# Patient Record
Sex: Male | Born: 1955 | Race: White | Hispanic: No | Marital: Married | Smoking: Former smoker
Health system: Southern US, Community
[De-identification: ages and names within clinical notes are randomized; demographics above are authoritative.]

## PROBLEM LIST (undated history)

## (undated) DIAGNOSIS — R002 Palpitations: Secondary | ICD-10-CM

## (undated) DIAGNOSIS — E785 Hyperlipidemia, unspecified: Secondary | ICD-10-CM

## (undated) DIAGNOSIS — K760 Fatty (change of) liver, not elsewhere classified: Secondary | ICD-10-CM

## (undated) DIAGNOSIS — K219 Gastro-esophageal reflux disease without esophagitis: Secondary | ICD-10-CM

## (undated) DIAGNOSIS — F419 Anxiety disorder, unspecified: Secondary | ICD-10-CM

## (undated) DIAGNOSIS — E559 Vitamin D deficiency, unspecified: Secondary | ICD-10-CM

## (undated) DIAGNOSIS — K589 Irritable bowel syndrome without diarrhea: Secondary | ICD-10-CM

## (undated) DIAGNOSIS — E119 Type 2 diabetes mellitus without complications: Secondary | ICD-10-CM

## (undated) DIAGNOSIS — M1990 Unspecified osteoarthritis, unspecified site: Secondary | ICD-10-CM

## (undated) DIAGNOSIS — F411 Generalized anxiety disorder: Secondary | ICD-10-CM

## (undated) DIAGNOSIS — R7989 Other specified abnormal findings of blood chemistry: Secondary | ICD-10-CM

## (undated) DIAGNOSIS — I10 Essential (primary) hypertension: Secondary | ICD-10-CM

## (undated) HISTORY — DX: Hyperlipidemia, unspecified: E78.5

## (undated) HISTORY — DX: Other specified abnormal findings of blood chemistry: R79.89

## (undated) HISTORY — DX: Gastro-esophageal reflux disease without esophagitis: K21.9

## (undated) HISTORY — PX: COLONOSCOPY: SHX174

## (undated) HISTORY — PX: POLYPECTOMY: SHX149

## (undated) HISTORY — DX: Anxiety disorder, unspecified: F41.9

## (undated) HISTORY — DX: Essential (primary) hypertension: I10

## (undated) HISTORY — PX: OTHER SURGICAL HISTORY: SHX169

## (undated) HISTORY — DX: Fatty (change of) liver, not elsewhere classified: K76.0

## (undated) HISTORY — DX: Vitamin D deficiency, unspecified: E55.9

## (undated) HISTORY — PX: CARPAL TUNNEL RELEASE: SHX101

## (undated) HISTORY — DX: Generalized anxiety disorder: F41.1

## (undated) HISTORY — DX: Palpitations: R00.2

## (undated) HISTORY — DX: Unspecified osteoarthritis, unspecified site: M19.90

## (undated) HISTORY — DX: Irritable bowel syndrome, unspecified: K58.9

---

## 2001-12-29 ENCOUNTER — Ambulatory Visit (HOSPITAL_COMMUNITY): Admission: RE | Admit: 2001-12-29 | Discharge: 2001-12-29 | Payer: Self-pay | Admitting: Family Medicine

## 2001-12-29 ENCOUNTER — Encounter: Payer: Self-pay | Admitting: Family Medicine

## 2002-04-11 ENCOUNTER — Encounter: Payer: Self-pay | Admitting: Gastroenterology

## 2002-04-11 ENCOUNTER — Ambulatory Visit (HOSPITAL_COMMUNITY): Admission: RE | Admit: 2002-04-11 | Discharge: 2002-04-11 | Payer: Self-pay | Admitting: Gastroenterology

## 2006-07-01 ENCOUNTER — Ambulatory Visit: Payer: Self-pay | Admitting: Gastroenterology

## 2006-07-15 ENCOUNTER — Ambulatory Visit: Payer: Self-pay | Admitting: Gastroenterology

## 2010-01-20 ENCOUNTER — Telehealth: Payer: Self-pay | Admitting: Gastroenterology

## 2010-04-17 NOTE — Progress Notes (Signed)
Summary: Abd Pain   Phone Note Call from Patient Call back at Home Phone [phone]   Caller: spouse-Karen Call For: Dr Russella Dar Summary of Call: Abd Pain  Initial call taken by: Leanor Kail [facility],  January 20, 2010 10:18 AM  Follow-up for Phone Call        I spoke with the patient's wife, he is not having abdominal pain, he he is having rectal discomfort.  Wife describes a "polyp" in the rectum.  He told her it was painful.  They wanted to know who to see.  Patient is advised we can schedule an appointment here or he can start with his primary care.  Wife states they will call back once she speaks with her husband again. Follow-up by: Darcey Nora RN, CGRN,  January 20, 2010 11:03 AM

## 2011-06-01 ENCOUNTER — Encounter: Payer: Self-pay | Admitting: Gastroenterology

## 2011-06-15 ENCOUNTER — Encounter: Payer: Self-pay | Admitting: Gastroenterology

## 2011-07-17 ENCOUNTER — Ambulatory Visit (AMBULATORY_SURGERY_CENTER): Payer: BC Managed Care – PPO | Admitting: *Deleted

## 2011-07-17 VITALS — Ht 63.0 in | Wt 182.0 lb

## 2011-07-17 DIAGNOSIS — Z800 Family history of malignant neoplasm of digestive organs: Secondary | ICD-10-CM

## 2011-07-17 DIAGNOSIS — Z86010 Personal history of colonic polyps: Secondary | ICD-10-CM

## 2011-07-17 MED ORDER — PEG-KCL-NACL-NASULF-NA ASC-C 100 G PO SOLR: ORAL | Status: DC

## 2011-07-17 NOTE — Progress Notes (Signed)
Patient states he had no problems with sedation last 2 colonoscopies, he did not want to change colonoscopy date and did not want deep sedation.

## 2011-07-20 ENCOUNTER — Encounter: Payer: Self-pay | Admitting: General Practice

## 2011-07-20 ENCOUNTER — Encounter: Payer: Self-pay | Admitting: Gastroenterology

## 2011-07-31 ENCOUNTER — Encounter: Payer: Self-pay | Admitting: Gastroenterology

## 2011-07-31 ENCOUNTER — Ambulatory Visit (AMBULATORY_SURGERY_CENTER): Payer: BC Managed Care – PPO | Admitting: Gastroenterology

## 2011-07-31 VITALS — BP 148/95 | HR 83 | Temp 96.5°F | Resp 20 | Ht 63.0 in | Wt 182.0 lb

## 2011-07-31 DIAGNOSIS — Z800 Family history of malignant neoplasm of digestive organs: Secondary | ICD-10-CM

## 2011-07-31 DIAGNOSIS — D126 Benign neoplasm of colon, unspecified: Secondary | ICD-10-CM

## 2011-07-31 DIAGNOSIS — Z86010 Personal history of colonic polyps: Secondary | ICD-10-CM

## 2011-07-31 LAB — HM COLONOSCOPY

## 2011-07-31 MED ORDER — SODIUM CHLORIDE 0.9 % IV SOLN: 500.0000 mL | INTRAVENOUS | Status: DC

## 2011-07-31 NOTE — Progress Notes (Signed)
Patient did not experience any of the following events: a burn prior to discharge; a fall within the facility; wrong site/side/patient/procedure/implant event; or a hospital transfer or hospital admission upon discharge from the facility. (G8907) Patient did not have preoperative order for IV antibiotic SSI prophylaxis. (G8918)  

## 2011-07-31 NOTE — Op Note (Signed)
[facility] [address] [place], [place]  [address]  COLONOSCOPY PROCEDURE REPORT PATIENT:  Gerhart, Ruggieri  MR#:  [id number] BIRTHDATE:  04/13/1955, 55 yrs. old  GENDER:  male ENDOSCOPIST:  Judie Petit T. Russella Dar, MD, [facility]  PROCEDURE DATE:  07/31/2011 PROCEDURE:  Colonoscopy with snare polypectomy ASA CLASS:  Class II INDICATIONS:  1) surveillance and high-risk screening  2) history of pre-cancerous (adenomatous) colon polyps : 2004 MEDICATIONS:   These medications were titrated to patient response per physician's verbal order, Fentanyl 75 mcg IV, Versed 8 mg IV DESCRIPTION OF PROCEDURE:   After the risks benefits and alternatives of the procedure were thoroughly explained, informed consent was obtained.  Digital rectal exam was performed and revealed no abnormalities.   The LB CF-[id number] endoscope was introduced through the anus and advanced to the cecum, which was identified by both the appendix and ileocecal valve, without limitations.  The quality of the prep was excellent, using MoviPrep.  The instrument was then slowly withdrawn as the colon was fully examined. <<PROCEDUREIMAGES>> FINDINGS:  A sessile polyp was found in the mid transverse colon. It was 5 mm in size. Polyp was snared without cautery. Retrieval was successful. A sessile polyp was found at the splenic flexure. It was 5 mm in size. Polyp was snared without cautery. Retrieval was unsuccessful. Mild diverticulosis was found in the sigmoid colon. Otherwise normal colonoscopy without other polyps, masses, vascular ectasias, or inflammatory changes.   Retroflexed views in the rectum revealed no abnormalities.  The time to cecum =  1.5 minutes. The scope was then withdrawn (time =  10  min) from the patient and the procedure completed.  COMPLICATIONS:  None  ENDOSCOPIC IMPRESSION: 1) 5 mm sessile polyp in the mid transverse colon 2) 5 mm sessile polyp at the splenic flexure 3) Mild diverticulosis in  the sigmoid colon  RECOMMENDATIONS: 1) Await pathology results 2) High fiber diet with liberal fluid intake. 3) Repeat Colonoscopy in 5 years.  Venita Lick. Russella Dar, MD, [facility]  CC:  Rudi Heap, MD  n. Rosalie DoctorVenita Lick. Cale Bethard at 07/31/2011 10:51 AM  Serafina Royals, [phone]

## 2011-07-31 NOTE — Patient Instructions (Addendum)

## 2011-08-03 ENCOUNTER — Telehealth: Payer: Self-pay | Admitting: *Deleted

## 2011-08-03 NOTE — Telephone Encounter (Signed)
Left message

## 2011-08-04 ENCOUNTER — Encounter: Payer: Self-pay | Admitting: Gastroenterology

## 2011-08-18 ENCOUNTER — Other Ambulatory Visit: Payer: Self-pay | Admitting: Family Medicine

## 2011-08-18 DIAGNOSIS — R1031 Right lower quadrant pain: Secondary | ICD-10-CM

## 2011-08-21 ENCOUNTER — Ambulatory Visit (HOSPITAL_COMMUNITY)
Admission: RE | Admit: 2011-08-21 | Discharge: 2011-08-21 | Disposition: A | Discharge status: Home / Self Care | Payer: BC Managed Care – PPO | Source: Ambulatory Visit | Attending: Family Medicine | Admitting: Family Medicine

## 2011-08-21 DIAGNOSIS — K824 Cholesterolosis of gallbladder: Secondary | ICD-10-CM | POA: Insufficient documentation

## 2011-08-21 DIAGNOSIS — R1031 Right lower quadrant pain: Secondary | ICD-10-CM

## 2011-08-21 DIAGNOSIS — K7689 Other specified diseases of liver: Secondary | ICD-10-CM | POA: Insufficient documentation

## 2011-12-22 ENCOUNTER — Encounter: Payer: Self-pay | Admitting: Cardiology

## 2011-12-22 ENCOUNTER — Ambulatory Visit (INDEPENDENT_AMBULATORY_CARE_PROVIDER_SITE_OTHER): Payer: BC Managed Care – PPO | Admitting: Cardiology

## 2011-12-22 VITALS — BP 168/105 | HR 97 | Ht 63.0 in | Wt 176.0 lb

## 2011-12-22 DIAGNOSIS — R002 Palpitations: Secondary | ICD-10-CM | POA: Insufficient documentation

## 2011-12-22 DIAGNOSIS — F419 Anxiety disorder, unspecified: Secondary | ICD-10-CM

## 2011-12-22 DIAGNOSIS — E785 Hyperlipidemia, unspecified: Secondary | ICD-10-CM

## 2011-12-22 DIAGNOSIS — F411 Generalized anxiety disorder: Secondary | ICD-10-CM

## 2011-12-22 DIAGNOSIS — I10 Essential (primary) hypertension: Secondary | ICD-10-CM

## 2011-12-22 NOTE — Progress Notes (Signed)
HPI The patient presents as a new patient for evaluation of palpitations. He apparently was seen some years ago with a hypotensive episode after being treated with medications for hypertension. He reports having a stress test which was normal. She's had no other cardiac workup. He said in June of this year he started noticing increasing palpitations. He would describe skipped beats. These would be frequent. He did wear a Holter and he was told there was sinus arrhythmia. He did stop drinking caffeine at bedtime and stopped eating pork. He said his symptoms improved though he still occasionally will get palpitations.  He reports that he does get some arm discomfort. This is sporadic and at rest and may have been with emotional anxiety. He describes himself as high strung.  His wife agrees. He has not get chest pressure, neck or arm discomfort. He may get short of breath when he exerts himself such as at work where he has a very physical job. He's not describing PND or orthopnea. He's not describing presyncope or syncope.  No Known Allergies  Current Outpatient Prescriptions  Medication Sig Dispense Refill  . ALPRAZolam (XANAX) 0.25 MG tablet Take 0.25 mg by mouth 2 (two) times daily as needed.      Marland Kitchen aspirin 81 MG tablet Take 81 mg by mouth daily.      . hydrochlorothiazide (MICROZIDE) 12.5 MG capsule Take 12.5 mg by mouth daily.      . pravastatin (PRAVACHOL) 20 MG tablet Take 20 mg by mouth daily.        Past Medical History  Diagnosis Date  . IBS (irritable bowel syndrome)   . GAD (generalized anxiety disorder)   . Palpitations   . Hypertension     No past surgical history on file.  Family History  Problem Relation Age of Onset  . Colon cancer Neg Hx   . Rheum arthritis Mother   . Heart disease Father     History   Social History  . Marital Status: Single    Spouse Name: N/A    Number of Children: N/A  . Years of Education: N/A   Occupational History  . Not on file.    Social History Main Topics  . Smoking status: Former Smoker    Quit date: 12/22/1991  . Smokeless tobacco: Never Used  . Alcohol Use: 1.2 oz/week    2 Cans of beer per week  . Drug Use: No  . Sexually Active: Not on file   Other Topics Concern  . Not on file   Social History Narrative  . No narrative on file    ROS:  Otherwise as stated in the HPI and negative for all other systems.  PHYSICAL EXAM BP 168/105  Pulse 97  Ht 5\' 3"  (1.6 m)  Wt 79.833 kg (176 lb)  BMI 31.18 kg/m2 GENERAL:  Well appearing HEENT:  Pupils equal round and reactive, fundi not visualized, oral mucosa unremarkable NECK:  No jugular venous distention, waveform within normal limits, carotid upstroke brisk and symmetric, no bruits, no thyromegaly LYMPHATICS:  No cervical, inguinal adenopathy LUNGS:  Clear to auscultation bilaterally BACK:  No CVA tenderness CHEST:  Unremarkable HEART:  PMI not displaced or sustained,S1 and S2 within normal limits, no S3, no S4, no clicks, no rubs, no murmurs ABD:  Flat, positive bowel sounds normal in frequency in pitch, no bruits, no rebound, no guarding, no midline pulsatile mass, no hepatomegaly, no splenomegaly EXT:  2 plus pulses throughout, no edema, no cyanosis  no clubbing SKIN:  No rashes no nodules NEURO:  Cranial nerves II through XII grossly intact, motor grossly intact throughout PSYCH:  Cognitively intact, oriented to person place and time  EKG:  Sinus rhythm, rate 97, axis within rightward, nonspecific inferior T wave changes, borderline QT prolongation.  12/22/2011   ASSESSMENT AND PLAN  Palpitations - I will make sure he has a structurally normal heart by checking an echocardiogram.  Likely I will manage this conservatively as he is sensitive to medications. Further evaluation will be based on symptoms and the results of an echo and treadmill test.  Arm pain - This is somewhat atypical.  I will bring the patient back for a POET (Plain Old Exercise  Test). This will allow me to screen for obstructive coronary disease, risk stratify and very importantly provide a prescription for exercise.  Anxiety - We discussed this for quite a while and I encouraged him to get followup for anxiety and possible obsessive of compulsive component.  Dyslipidemia - His LDL recently was 151. He was only placed on pravastatin because he has been so sensitive to medications. I discussed with him that he will need diet control as well.  Hypertension - I reviewed her blood pressure diary. His blood pressure is controlled at home. I have suggested exercise and weight loss to complement the hydrochlorothiazide.

## 2011-12-22 NOTE — Patient Instructions (Addendum)
The current medical regimen is effective;  continue present plan and medications.  Your physician has requested that you have an echocardiogram. Echocardiography is a painless test that uses sound waves to create images of your heart. It provides your doctor with information about the size and shape of your heart and how well your heart's chambers and valves are working. This procedure takes approximately one hour. There are no restrictions for this procedure.  Your physician has requested that you have an exercise tolerance test. For further information please visit [email or web address]. Please also follow instruction sheet, as given.   

## 2011-12-31 ENCOUNTER — Ambulatory Visit (HOSPITAL_COMMUNITY): Payer: BC Managed Care – PPO | Attending: Cardiovascular Disease | Admitting: Radiology

## 2011-12-31 DIAGNOSIS — R002 Palpitations: Secondary | ICD-10-CM | POA: Insufficient documentation

## 2011-12-31 DIAGNOSIS — I369 Nonrheumatic tricuspid valve disorder, unspecified: Secondary | ICD-10-CM | POA: Insufficient documentation

## 2011-12-31 DIAGNOSIS — I059 Rheumatic mitral valve disease, unspecified: Secondary | ICD-10-CM | POA: Insufficient documentation

## 2011-12-31 DIAGNOSIS — I10 Essential (primary) hypertension: Secondary | ICD-10-CM | POA: Insufficient documentation

## 2011-12-31 NOTE — Progress Notes (Signed)
Echocardiogram performed.  

## 2012-01-01 ENCOUNTER — Ambulatory Visit (INDEPENDENT_AMBULATORY_CARE_PROVIDER_SITE_OTHER): Payer: BC Managed Care – PPO | Admitting: Physician Assistant

## 2012-01-01 DIAGNOSIS — R002 Palpitations: Secondary | ICD-10-CM

## 2012-01-01 NOTE — Procedures (Signed)
4Exercise Treadmill Test  Pre-Exercise Testing Evaluation Rhythm: normal sinus  Rate: 92   PR:  .15 QRS:  .08  QT:  .35 QTc: .44     Test  Exercise Tolerance Test Ordering MD: Angelina Sheriff, MD  Interpreting MD: Tereso Newcomer , PA-C  Unique Test No: 1  Treadmill:  1  Indication for ETT: Palp  Contraindication to ETT: No   Stress Modality: exercise - treadmill  Cardiac Imaging Performed: non   Protocol: standard Bruce - maximal  Max BP:  217/89  Max MPHR (bpm):  166 85% MPR (bpm):  141  MPHR obtained (bpm):  171 % MPHR obtained:  103%  Reached 85% MPHR (min:sec):  4:00 Total Exercise Time (min-sec):  7:00  Workload in METS:  8.3 Borg Scale: 13  Reason ETT Terminated:  desired heart rate attained    ST Segment Analysis At Rest: normal ST segments - no evidence of significant ST depression With Exercise: no evidence of significant ST depression  Other Information Arrhythmia:  No Angina during ETT:  absent (0) Quality of ETT:  diagnostic  ETT Interpretation:  normal - no evidence of ischemia by ST analysis  Comments: Fair exercise tolerance. No chest pain. Normal BP response to exercise. No ST-T changes to suggest ischemia.  High resting heart rate noted.  Recommendations: Results d/w Drexel Iha. Follow up with Dr. Rollene Rotunda as directed. Signed,  Tereso Newcomer, PA-C  10:38 AM 01/01/2012

## 2012-01-06 ENCOUNTER — Encounter: Payer: Self-pay | Admitting: Cardiology

## 2012-05-23 ENCOUNTER — Encounter: Payer: Self-pay | Admitting: Family Medicine

## 2012-05-31 ENCOUNTER — Ambulatory Visit (INDEPENDENT_AMBULATORY_CARE_PROVIDER_SITE_OTHER): Payer: BC Managed Care – PPO | Admitting: Family Medicine

## 2012-05-31 ENCOUNTER — Encounter: Payer: Self-pay | Admitting: Family Medicine

## 2012-05-31 VITALS — BP 153/91 | HR 85 | Temp 98.7°F | Ht 63.0 in | Wt 182.8 lb

## 2012-05-31 DIAGNOSIS — E785 Hyperlipidemia, unspecified: Secondary | ICD-10-CM

## 2012-05-31 DIAGNOSIS — R0789 Other chest pain: Secondary | ICD-10-CM

## 2012-05-31 DIAGNOSIS — N529 Male erectile dysfunction, unspecified: Secondary | ICD-10-CM

## 2012-05-31 DIAGNOSIS — F411 Generalized anxiety disorder: Secondary | ICD-10-CM

## 2012-05-31 DIAGNOSIS — I10 Essential (primary) hypertension: Secondary | ICD-10-CM

## 2012-05-31 DIAGNOSIS — R002 Palpitations: Secondary | ICD-10-CM

## 2012-05-31 LAB — TSH: TSH: 2.164 u[IU]/mL (ref 0.350–4.500)

## 2012-05-31 MED ORDER — HYDROCHLOROTHIAZIDE 25 MG PO TABS: 25.0000 mg | ORAL_TABLET | Freq: Every day | ORAL | Status: DC

## 2012-05-31 MED ORDER — PAROXETINE HCL 10 MG PO TABS: 10.0000 mg | ORAL_TABLET | Freq: Every day | ORAL | Status: DC

## 2012-05-31 MED ORDER — HYDROCHLOROTHIAZIDE 12.5 MG PO CAPS: 12.5000 mg | ORAL_CAPSULE | Freq: Every day | ORAL | Status: DC

## 2012-05-31 MED ORDER — ALPRAZOLAM 0.25 MG PO TABS: 0.2500 mg | ORAL_TABLET | Freq: Two times a day (BID) | ORAL | Status: DC | PRN

## 2012-05-31 NOTE — Addendum Note (Signed)
Addended by: Ileana Ladd on: 05/31/2012 11:21 AM   Modules accepted: Orders

## 2012-05-31 NOTE — Progress Notes (Addendum)
Subjective:     Patient ID: Tyler Iha., male   DOB: Nov 23, 1955, 57 y.o.   MRN: [id number]  HPI Patient here to establish with Dr. Modesto Charon. Problem list include: Hypertension no headache or chest pain he does exhibit episodes of palpitations associated with anxiety he has had extensive workup with cardiology before, including stress test report in chart . Hypercholesterolemia he is on pravastatin without any problems no muscle aches. He's on Xanax which she uses at night to sleep and any time he has had anxiety and palpitations and feeling very tense. He now complains of a new problem of having erectile dysfunction. He used to be sexually active once every 2 weeks or 3 times a week and most. He cannot maintain his erection and loses it within a couple of seconds or even a minute he believes he needs to be on Viagra. Past Medical History  Diagnosis Date  . IBS (irritable bowel syndrome)   . GAD (generalized anxiety disorder)   . Palpitations   . Hypertension   . Palpitations   . Anxiety     generalized anxiety disorder  . Hyperlipidemia    Past Surgical History  Procedure Laterality Date  . None     History   Social History  . Marital Status: Single    Spouse Name: N/A    Number of Children: N/A  . Years of Education: N/A   Occupational History  . [employer]    Social History Main Topics  . Smoking status: Former Smoker    Types: Cigarettes    Quit date: 12/22/1991  . Smokeless tobacco: Never Used     Comment: Smoked few cigarettes in the past  . Alcohol Use: 1.2 oz/week    2 Cans of beer per week  . Drug Use: No  . Sexually Active: Not on file   Other Topics Concern  . Not on file   Social History Narrative   Lives with wife.  One child.  One grandchild.     Family History  Problem Relation Age of Onset  . Colon cancer Neg Hx   . Rheum arthritis Mother     Died age 75  . Heart disease Father 47    Valve replacement, died age 110 suicide   Current  Outpatient Prescriptions on File Prior to Visit  Medication Sig Dispense Refill  . ALPRAZolam (XANAX) 0.25 MG tablet Take 0.25 mg by mouth 2 (two) times daily as needed.      Marland Kitchen aspirin 81 MG tablet Take 81 mg by mouth daily.      . hydrochlorothiazide (MICROZIDE) 12.5 MG capsule Take 12.5 mg by mouth daily.      . pravastatin (PRAVACHOL) 20 MG tablet Take 20 mg by mouth daily.       No current facility-administered medications on file prior to visit.   No Known Allergies  There is no immunization history on file for this patient. Prior to Admission medications   Medication Sig Start Date End Date Taking? Authorizing Provider  ALPRAZolam (XANAX) 0.25 MG tablet Take 0.25 mg by mouth 2 (two) times daily as needed.   Yes Historical Provider, MD  aspirin 81 MG tablet Take 81 mg by mouth daily.   Yes Historical Provider, MD  hydrochlorothiazide (MICROZIDE) 12.5 MG capsule Take 12.5 mg by mouth daily.   Yes Historical Provider, MD  pravastatin (PRAVACHOL) 20 MG tablet Take 20 mg by mouth daily.   Yes Historical Provider, MD     Review  of Systems  Constitutional: Negative.   HENT: Negative.   Eyes: Negative.   Respiratory: Negative.   Cardiovascular: Positive for palpitations.  Gastrointestinal: Negative.   Endocrine: Negative.   Musculoskeletal: Negative.   Skin: Negative.   Allergic/Immunologic: Negative.   Neurological: Negative.   Hematological: Negative.   Psychiatric/Behavioral: Negative.    GU system he has erectile dysfunction as discussed in the history of present illness.:    Objective:   Physical Exam Appearance: The patient appeared well nourished and normally developed. In no acute distress. Central obesity   Vital signs:  As documented. BP 153/91  Pulse 85  Temp(Src) 98.7 F (37.1 C) (Oral)  Ht 5\' 3"  (1.6 m)  Wt 182 lb 12.8 oz (82.918 kg)  BMI 32.39 kg/m2    Head exam:  Unremarkable. No scleral icterus or corneal arcus noted.   Neck: Without jugular  venous distension, thyromegaly, or carotid bruits. Carotid upstrokes are brisk bilaterally.  Chest & Lungs: The chest wall is normal. The lungs are clear to auscultation and percussion.  Cardiac exam: Reveals the PMI to be normally sized and situated. Rhythm is regular. First and second heart sounds normal. No murmurs, rubs or gallops.  Abdominal exam: Reveals normal bowel sounds, No masses, no organomegaly and no aortic enlargement.  No guarding no rebound. No Bruits.  GU:  Extremities: nonedematous. Both femoral and pedal pulses are normal. Other peripheral pulses are normal. Joints: intact  Neurologic: Oriented to time place and person.  Strength is normal. Sensory is normal. Reflexes are normal. Cranial Nerves are normal.  Sensory exam: monofilament exam was normal and intact.  Skin exam:  Normal without Tattoos, rashes or scars.      Assessment:    1 Gen. anxiety disorder with palpitation. 2 history of irritable bowel syndrome. 3 hypertension. He has white coat syndrome his blood pressure tends to run normal at home when he measures it and also at work. 4 hypercholesterolemia. He is here fasting for lab work. 5 new complaint of erectile dysfunction.  On review of his chart he is noted to have an extensive cardiac workup. Unable to stop the patient is due to any abnormalities his EKG has no acute findings today.     Plan:     Orders Placed This Encounter  Procedures  . Basic Metabolic Panel  . Hepatic Function Panel  . NMR Lipoprofile with Lipids  . Testosterone  . TSH  . Prolactin  . EKG 12-Lead   EKG was reviewed today and there were no acute findings. With the extensive cardiac workup he had last year I doubt that his symptomatology is related with a cardiac event. His underlying problem of generalized anxiety disorder is his prevailing problem.  will go ahead and treat him with Paxil started at 10 mg daily with the hope of increasing the dose on  his next visit to 20 mg daily.  we'll get required labs needed. During his visit he discussed about having erectile dysfunction is a new problem and this was added to his problem list and and workup was initiated with lab work in Coumadin testosterone TSH and prolactin. Pending this result whether he would need to go on Viagra.  Patient agrees with this treatment plan. Await his lab results. Further intervention pending his lab results Rayland Hamed P. Modesto Charon, M.D.

## 2012-05-31 NOTE — Addendum Note (Signed)
Addended by: Ileana Ladd on: 05/31/2012 02:33 PM   Modules accepted: Orders, Medications

## 2012-06-01 LAB — HEPATIC FUNCTION PANEL
ALT: 61 U/L — ABNORMAL HIGH (ref 0–53)
AST: 36 U/L (ref 0–37)
Albumin: 5.1 g/dL (ref 3.5–5.2)
Alkaline Phosphatase: 57 U/L (ref 39–117)
Bilirubin, Direct: 0.1 mg/dL (ref 0.0–0.3)
Indirect Bilirubin: 0.4 mg/dL (ref 0.0–0.9)
Total Bilirubin: 0.5 mg/dL (ref 0.3–1.2)
Total Protein: 7.8 g/dL (ref 6.0–8.3)

## 2012-06-01 LAB — BASIC METABOLIC PANEL
BUN: 19 mg/dL (ref 6–23)
CO2: 27 mEq/L (ref 19–32)
Calcium: 10.1 mg/dL (ref 8.4–10.5)
Chloride: 101 mEq/L (ref 96–112)
Creat: 0.93 mg/dL (ref 0.50–1.35)
Glucose, Bld: 105 mg/dL — ABNORMAL HIGH (ref 70–99)
Potassium: 4.3 mEq/L (ref 3.5–5.3)
Sodium: 137 mEq/L (ref 135–145)

## 2012-06-01 LAB — PROLACTIN: Prolactin: 5.5 ng/mL (ref 2.1–17.1)

## 2012-06-01 LAB — TESTOSTERONE: Testosterone: 212 ng/dL — ABNORMAL LOW (ref 300–890)

## 2012-06-03 LAB — NMR LIPOPROFILE WITH LIPIDS
Cholesterol, Total: 180 mg/dL (ref <200)
HDL Particle Number: 41.5 umol/L (ref >=30.5)
HDL Size: 8.6 nm — ABNORMAL LOW (ref >=9.2)
HDL-C: 58 mg/dL (ref >=40)
LDL (calc): 98 mg/dL (ref <100)
LDL Particle Number: 1870 nmol/L — ABNORMAL HIGH (ref <1000)
LDL Size: 21 nm (ref >20.5)
LP-IR Score: 80 — ABNORMAL HIGH (ref <=45)
Large HDL-P: 3.3 umol/L — ABNORMAL LOW (ref >=4.8)
Large VLDL-P: 4.5 nmol/L — ABNORMAL HIGH (ref <=2.7)
Small LDL Particle Number: 980 nmol/L — ABNORMAL HIGH (ref <=527)
Triglycerides: 122 mg/dL (ref <150)
VLDL Size: 53.7 nm — ABNORMAL HIGH (ref >=46.6)

## 2012-06-06 ENCOUNTER — Other Ambulatory Visit: Payer: Self-pay | Admitting: Family Medicine

## 2012-06-06 ENCOUNTER — Telehealth: Payer: Self-pay | Admitting: Family Medicine

## 2012-06-06 DIAGNOSIS — R7989 Other specified abnormal findings of blood chemistry: Secondary | ICD-10-CM

## 2012-06-06 MED ORDER — PRAVASTATIN SODIUM 40 MG PO TABS: 40.0000 mg | ORAL_TABLET | Freq: Every day | ORAL | Status: DC

## 2012-06-06 NOTE — Progress Notes (Signed)
Quick Note:  Labs abnormal. LDL particle is very high. We should consider increasing her pravastatin to 40 mg at night. Patient to let us know if I should order that higher dose for him. Also his testosterone is low and we would need to do a PSA prior to consideration to start him on AndroGel for testosterone replacement. Let us know if you would like [place] to move ahead with that. ______

## 2012-06-06 NOTE — Telephone Encounter (Signed)
Pt notifed of labs. See labs

## 2012-06-06 NOTE — Telephone Encounter (Signed)
Patient states he is returning Gina's call.

## 2012-06-07 ENCOUNTER — Other Ambulatory Visit: Payer: BC Managed Care – PPO

## 2012-06-07 DIAGNOSIS — R7989 Other specified abnormal findings of blood chemistry: Secondary | ICD-10-CM

## 2012-06-08 ENCOUNTER — Other Ambulatory Visit: Payer: Self-pay | Admitting: Family Medicine

## 2012-06-08 DIAGNOSIS — E291 Testicular hypofunction: Secondary | ICD-10-CM

## 2012-06-08 LAB — PSA: PSA: 0.82 ng/mL (ref <=4.00)

## 2012-06-08 MED ORDER — TESTOSTERONE 20.25 MG/1.25GM (1.62%) TD GEL: 1.0000 | Freq: Every morning | TRANSDERMAL | Status: DC

## 2012-07-12 ENCOUNTER — Encounter: Payer: Self-pay | Admitting: Family Medicine

## 2012-07-12 ENCOUNTER — Ambulatory Visit (INDEPENDENT_AMBULATORY_CARE_PROVIDER_SITE_OTHER): Payer: BC Managed Care – PPO | Admitting: Family Medicine

## 2012-07-12 VITALS — BP 140/82 | HR 87 | Temp 97.8°F | Wt 177.8 lb

## 2012-07-12 DIAGNOSIS — I10 Essential (primary) hypertension: Secondary | ICD-10-CM

## 2012-07-12 DIAGNOSIS — F411 Generalized anxiety disorder: Secondary | ICD-10-CM

## 2012-07-12 MED ORDER — HYDROCHLOROTHIAZIDE 25 MG PO TABS: 25.0000 mg | ORAL_TABLET | Freq: Every day | ORAL | Status: DC

## 2012-07-12 MED ORDER — PAROXETINE HCL 20 MG PO TABS: 20.0000 mg | ORAL_TABLET | Freq: Every day | ORAL | Status: DC

## 2012-07-12 NOTE — Progress Notes (Signed)
Patient ID: Tyler Kost., male   DOB: 04/16/1955, 57 y.o.   MRN: [id number] SUBJECTIVE: Patient is here for follow up of hypertension: denies Headache;deniesChest Pain;denies weakness;denies Shortness of Breath or Orthopnea;denies Visual changes;denies palpitations;denies cough;denies pedal edema;denies symptoms of TIA or stroke; admits to Compliance with medications. denies Problems with medications. Hesitant to start on the androgel. Prefers to [employer] exrcise and weight loss. The Ed is improving. The paxil helped and could do with a dose increase. Overall feels better. Came to review labs.   PMH/PSH: reviewed/updated in Epic  SH/FH: reviewed/updated in Epic  Allergies: reviewed/updated in Epic  Medications: reviewed/updated in Epic  Immunizations: reviewed/updated in Epic  ROS: As above in the HPI. All other systems are stable or negative.  OBJECTIVE: APPEARANCE: central obesity Patient in no acute distress.The patient appeared well nourished and normally developed. Acyanotic. Waist: VITAL SIGNS:BP 140/82  Pulse 87  Temp(Src) 97.8 F (36.6 C) (Oral)  Wt 177 lb 12.8 oz (80.65 kg)  BMI 31.5 kg/m2   SKIN: warm and  Dry without overt rashes, tattoos and scars  HEAD and Neck: without JVD, Head and scalp: normal Eyes:No scleral icterus. Fundi normal, eye movements normal. Ears: Auricle normal, canal normal, Tympanic membranes normal, insufflation normal. Nose: normal Throat: normal Neck & thyroid: normal  CHEST & LUNGS: Chest wall: normal Lungs: Clear  CVS: Reveals the PMI to be normally located. Regular rhythm, First and Second Heart sounds are normal,  absence of murmurs, rubs or gallops. Peripheral vasculature: Radial pulses: normal Dorsal pedis pulses: normal Posterior pulses: normal  ABDOMEN:  Appearance: normal Benign,, no organomegaly, no masses, no Abdominal Aortic enlargement. No Guarding , no rebound. No Bruits. Bowel sounds:  normal  RECTAL: N/A GU: N/A  EXTREMETIES: nonedematous. Both Femoral and Pedal pulses are normal.  MUSCULOSKELETAL:  Spine: normal Joints: intact  NEUROLOGIC: oriented to time,place and person; nonfocal. Strength is normal Sensory is normal Reflexes are normal Cranial Nerves are normal.   Labs reviewed. ASSESSMENT: Generalized anxiety disorder - Plan: PARoxetine (PAXIL) 20 MG tablet  Unspecified essential hypertension - Plan: hydrochlorothiazide (HYDRODIURIL) 25 MG tablet   PLAN: Patient defers androgel and plans to lose weight exercise to see effect on testosterone. I agree. Meds ordered this encounter  Medications  . PARoxetine (PAXIL) 20 MG tablet    Sig: Take 1 tablet (20 mg total) by mouth daily.    Dispense:  30 tablet    Refill:  5  . hydrochlorothiazide (HYDRODIURIL) 25 MG tablet    Sig: Take 1 tablet (25 mg total) by mouth daily.    Dispense:  30 tablet    Refill:  5   No orders of the defined types were placed in this encounter.   Discussed goals diet exercise, etc. Increase the paxil and see how he does. Diet weight loss and exercise. Continue monitoring his BP.  RTC in 3 months.  [name], M.D.

## 2012-07-20 ENCOUNTER — Other Ambulatory Visit: Payer: Self-pay | Admitting: Family Medicine

## 2012-07-21 NOTE — Telephone Encounter (Signed)
LAST RF 06/21/12. CALL IN [facility]. ROUTE TO YOUR NURSE TO CALL IN.

## 2012-07-21 NOTE — Telephone Encounter (Signed)
[facility] ph[facility] 

## 2012-07-21 NOTE — Telephone Encounter (Signed)
Will need to be seen for future refill

## 2012-07-21 NOTE — Telephone Encounter (Signed)
Pt.notified

## 2012-08-23 ENCOUNTER — Other Ambulatory Visit: Payer: Self-pay | Admitting: Family Medicine

## 2012-08-28 ENCOUNTER — Other Ambulatory Visit: Payer: Self-pay | Admitting: Family Medicine

## 2012-08-30 NOTE — Telephone Encounter (Signed)
Last seen 07/12/12, last filled 05/14/12 with 1 RF. If approved have Almira Coaster H call into [facility]

## 2012-09-01 ENCOUNTER — Telehealth: Payer: Self-pay | Admitting: Family Medicine

## 2012-09-02 NOTE — Telephone Encounter (Signed)
Pt aware rf at [facility]

## 2012-09-02 NOTE — Telephone Encounter (Signed)
RF called to [facility] 

## 2012-09-02 NOTE — Telephone Encounter (Signed)
Already done by [facility] today

## 2012-09-22 ENCOUNTER — Other Ambulatory Visit: Payer: Self-pay | Admitting: Family Medicine

## 2012-10-28 ENCOUNTER — Ambulatory Visit (INDEPENDENT_AMBULATORY_CARE_PROVIDER_SITE_OTHER): Payer: BC Managed Care – PPO | Admitting: Family Medicine

## 2012-10-28 ENCOUNTER — Encounter: Payer: Self-pay | Admitting: Family Medicine

## 2012-10-28 VITALS — BP 147/88 | HR 84 | Temp 98.9°F | Ht 63.0 in | Wt 175.8 lb

## 2012-10-28 DIAGNOSIS — I10 Essential (primary) hypertension: Secondary | ICD-10-CM

## 2012-10-28 DIAGNOSIS — F411 Generalized anxiety disorder: Secondary | ICD-10-CM

## 2012-10-28 DIAGNOSIS — G4700 Insomnia, unspecified: Secondary | ICD-10-CM

## 2012-10-28 DIAGNOSIS — Z23 Encounter for immunization: Secondary | ICD-10-CM

## 2012-10-28 DIAGNOSIS — R002 Palpitations: Secondary | ICD-10-CM

## 2012-10-28 DIAGNOSIS — F419 Anxiety disorder, unspecified: Secondary | ICD-10-CM

## 2012-10-28 DIAGNOSIS — E785 Hyperlipidemia, unspecified: Secondary | ICD-10-CM

## 2012-10-28 MED ORDER — PAROXETINE HCL 10 MG PO TABS: 10.0000 mg | ORAL_TABLET | Freq: Every day | ORAL | Status: DC

## 2012-10-28 MED ORDER — PRAVASTATIN SODIUM 40 MG PO TABS: ORAL_TABLET | ORAL | Status: DC

## 2012-10-28 MED ORDER — HYDROCHLOROTHIAZIDE 25 MG PO TABS: 25.0000 mg | ORAL_TABLET | Freq: Every day | ORAL | Status: DC

## 2012-10-28 MED ORDER — DOXEPIN HCL 6 MG PO TABS: 6.0000 mg | ORAL_TABLET | Freq: Every day | ORAL | Status: DC

## 2012-10-28 NOTE — Patient Instructions (Addendum)
Tetanus, Diphtheria, Pertussis (Tdap) Vaccine What You Need to Know WHY GET VACCINATED? Tetanus, diphtheria and pertussis can be very serious diseases, even for adolescents and adults. Tdap vaccine can protect us from these diseases. TETANUS (Lockjaw) causes painful muscle tightening and stiffness, usually all over the body.  It can lead to tightening of muscles in the head and neck so you can't open your mouth, swallow, or sometimes even breathe. Tetanus kills about 1 out of 5 people who are infected. DIPHTHERIA can cause a thick coating to form in the back of the throat.  It can lead to breathing problems, paralysis, heart failure, and death. PERTUSSIS (Whooping Cough) causes severe coughing spells, which can cause difficulty breathing, vomiting and disturbed sleep.  It can also lead to weight loss, incontinence, and rib fractures. Up to 2 in 100 adolescents and 5 in 100 adults with pertussis are hospitalized or have complications, which could include pneumonia and death. These diseases are caused by bacteria. Diphtheria and pertussis are spread from person to person through coughing or sneezing. Tetanus enters the body through cuts, scratches, or wounds. Before vaccines, the [place] saw as many as 200,000 cases a year of diphtheria and pertussis, and hundreds of cases of tetanus. Since vaccination began, tetanus and diphtheria have dropped by about 99% and pertussis by about 80%. TDAP VACCINE Tdap vaccine can protect adolescents and adults from tetanus, diphtheria, and pertussis. One dose of Tdap is routinely given at age 11 or 12. People who did not get Tdap at that age should get it as soon as possible. Tdap is especially important for health care professionals and anyone having close contact with a baby younger than 12 months. Pregnant women should get a dose of Tdap during every pregnancy, to protect the newborn from pertussis. Infants are most at risk for severe, life-threatening  complications from pertussis. A similar vaccine, called Td, protects from tetanus and diphtheria, but not pertussis. A Td booster should be given every 10 years. Tdap may be given as one of these boosters if you have not already gotten a dose. Tdap may also be given after a severe cut or burn to prevent tetanus infection. Your doctor can give you more information. Tdap may safely be given at the same time as other vaccines. SOME PEOPLE SHOULD NOT GET THIS VACCINE  If you ever had a life-threatening allergic reaction after a dose of any tetanus, diphtheria, or pertussis containing vaccine, OR if you have a severe allergy to any part of this vaccine, you should not get Tdap. Tell your doctor if you have any severe allergies.  If you had a coma, or long or multiple seizures within 7 days after a childhood dose of DTP or DTaP, you should not get Tdap, unless a cause other than the vaccine was found. You can still get Td.  Talk to your doctor if you:  have epilepsy or another nervous system problem,  had severe pain or swelling after any vaccine containing diphtheria, tetanus or pertussis,  ever had Guillain-Barr Syndrome (GBS),  aren't feeling well on the day the shot is scheduled. RISKS OF A VACCINE REACTION With any medicine, including vaccines, there is a chance of side effects. These are usually mild and go away on their own, but serious reactions are also possible. Brief fainting spells can follow a vaccination, leading to injuries from falling. Sitting or lying down for about 15 minutes can help prevent these. Tell your doctor if you feel dizzy or light-headed, or   have vision changes or ringing in the ears. Mild problems following Tdap (Did not interfere with activities)  Pain where the shot was given (about 3 in 4 adolescents or 2 in 3 adults)  Redness or swelling where the shot was given (about 1 person in 5)  Mild fever of at least 100.42F (up to about 1 in 25 adolescents or 1 in  100 adults)  Headache (about 3 or 4 people in 10)  Tiredness (about 1 person in 3 or 4)  Nausea, vomiting, diarrhea, stomach ache (up to 1 in 4 adolescents or 1 in 10 adults)  Chills, body aches, sore joints, rash, swollen glands (uncommon) Moderate problems following Tdap (Interfered with activities, but did not require medical attention)  Pain where the shot was given (about 1 in 5 adolescents or 1 in 100 adults)  Redness or swelling where the shot was given (up to about 1 in 16 adolescents or 1 in 25 adults)  Fever over 102F (about 1 in 100 adolescents or 1 in 250 adults)  Headache (about 3 in 20 adolescents or 1 in 10 adults)  Nausea, vomiting, diarrhea, stomach ache (up to 1 or 3 people in 100)  Swelling of the entire arm where the shot was given (up to about 3 in 100). Severe problems following Tdap (Unable to perform usual activities, required medical attention)  Swelling, severe pain, bleeding and redness in the arm where the shot was given (rare). A severe allergic reaction could occur after any vaccine (estimated less than 1 in a million doses). WHAT IF THERE IS A SERIOUS REACTION? What should I look for?  Look for anything that concerns you, such as signs of a severe allergic reaction, very high fever, or behavior changes. Signs of a severe allergic reaction can include hives, swelling of the face and throat, difficulty breathing, a fast heartbeat, dizziness, and weakness. These would start a few minutes to a few hours after the vaccination. What should I do?  If you think it is a severe allergic reaction or other emergency that can't wait, call 9-1-1 or get the person to the nearest hospital. Otherwise, call your doctor.  Afterward, the reaction should be reported to the "Vaccine Adverse Event Reporting System" (VAERS). Your doctor might file this report, or you can do it yourself through the VAERS web site at www.vaers.LAgents.no, or by calling 1-806-700-6147. VAERS is  only for reporting reactions. They do not give medical advice.  THE NATIONAL VACCINE INJURY COMPENSATION PROGRAM The National Vaccine Injury Compensation Program (VICP) is a federal program that was created to compensate people who may have been injured by certain vaccines. Persons who believe they may have been injured by a vaccine can learn about the program and about filing a claim by calling 1-(272) 516-6708 or visiting the VICP website at SpiritualWord.at. HOW CAN I LEARN MORE?  Ask your doctor.  Call your local or state health department.  Contact the Centers for Disease Control and Prevention (CDC):  Call (847)287-9359 or visit CDC's website at PicCapture.uy. CDC Tdap Vaccine VIS (07/23/11) Document Released: 09/01/2011 Document Revised: 11/25/2011 Document Reviewed: 09/01/2011 ExitCare Patient Information 2014 [place], [place].        Dr Woodroe Mode Recommendations  Diet and Exercise discussed with patient.  For nutrition information, I recommend books:  1).Eat to Live by Dr Monico Hoar. 2).Prevent and Reverse Heart Disease by Dr Suzzette Righter. 3) Dr Katherina Right Book:  Program to Reverse Diabetes  Exercise recommendations are:  If unable to walk,  then the patient can exercise in a chair 3 times a day. By flapping arms like a bird gently and raising legs outwards to the front.  If ambulatory, the patient can go for walks for 30 minutes 3 times a week. Then increase the intensity and duration as tolerated.  Goal is to try to attain exercise frequency to 5 times a week.  If applicable: Best to perform resistance exercises (machines or weights) 2 days a week and cardio type exercises 3 days per week.

## 2012-10-28 NOTE — Progress Notes (Signed)
Patient ID: Tyler Pruitt., male   DOB: 1955/08/30, 57 y.o.   MRN: [id number] SUBJECTIVE: CC: Chief Complaint  Patient presents with  . Follow-up    3 month ck up fasting thinks paxil may be to  strong had"twictche in eyes"   . Medication Refill    needs all refill    HPI: Patient is here for follow up of hyperlipidemia:/HTN/Anxiety denies Headache;denies Chest Pain;denies weakness;denies Shortness of Breath and orthopnea;denies Visual changes;denies palpitations;denies cough;denies pedal edema;denies symptoms of TIA or stroke;deniesClaudication symptoms. admits to Compliance with medications; Problems with medications.stiffness in muscles but better now.  Paroxetine: his eyeballs started to get flickering lights in vision. Broke it in half and better.would like to reduce it.  Bp 135/85 usually: and it depends on what he eats  Insomnia: takes either unisom or xanax.for sleep.  Hasn't lost any weight.     Past Medical History  Diagnosis Date  . IBS (irritable bowel syndrome)   . GAD (generalized anxiety disorder)   . Palpitations   . Hypertension   . Palpitations   . Anxiety     generalized anxiety disorder  . Hyperlipidemia    Past Surgical History  Procedure Laterality Date  . None     History   Social History  . Marital Status: Single    Spouse Name: N/A    Number of Children: N/A  . Years of Education: N/A   Occupational History  . [employer]    Social History Main Topics  . Smoking status: Former Smoker    Types: Cigarettes    Quit date: 12/22/1991  . Smokeless tobacco: Never Used     Comment: Smoked few cigarettes in the past  . Alcohol Use: 1.2 oz/week    2 Cans of beer per week  . Drug Use: No  . Sexual Activity: Not on file   Other Topics Concern  . Not on file   Social History Narrative   Lives with wife.  One child.  One grandchild.     Family History  Problem Relation Age of Onset  . Colon cancer Neg Hx   . Rheum arthritis Mother      Died age 31  . Heart disease Father 40    Valve replacement, died age 52 suicide   Current Outpatient Prescriptions on File Prior to Visit  Medication Sig Dispense Refill  . ALPRAZolam (XANAX) 0.25 MG tablet Take 1 tablet (0.25 mg total) by mouth 2 (two) times daily as needed.  30 tablet  1  . aspirin 81 MG tablet Take 81 mg by mouth daily.      . hydrochlorothiazide (HYDRODIURIL) 25 MG tablet Take 1 tablet (25 mg total) by mouth daily.  30 tablet  5  . PARoxetine (PAXIL) 20 MG tablet Take 1 tablet (20 mg total) by mouth daily.  30 tablet  5  . pravastatin (PRAVACHOL) 40 MG tablet TAKE 1 TABLET (40 MG TOTAL) BY MOUTH DAILY.  30 tablet  2   No current facility-administered medications on file prior to visit.   No Known Allergies Immunization History  Administered Date(s) Administered  . Tdap 10/28/2012   Prior to Admission medications   Medication Sig Start Date End Date Taking? Authorizing Provider  ALPRAZolam (XANAX) 0.25 MG tablet Take 1 tablet (0.25 mg total) by mouth 2 (two) times daily as needed. 05/31/12  Yes Ileana Ladd, MD  aspirin 81 MG tablet Take 81 mg by mouth daily.   Yes Historical Provider, MD  doxylamine, Sleep, (UNISOM) 25 MG tablet Take 25 mg by mouth at bedtime as needed for sleep.   Yes Historical Provider, MD  hydrochlorothiazide (HYDRODIURIL) 25 MG tablet Take 1 tablet (25 mg total) by mouth daily. 07/12/12 07/12/13 Yes Ileana Ladd, MD  PARoxetine (PAXIL) 20 MG tablet Take 1 tablet (20 mg total) by mouth daily. 07/12/12  Yes Ileana Ladd, MD  pravastatin (PRAVACHOL) 40 MG tablet TAKE 1 TABLET (40 MG TOTAL) BY MOUTH DAILY. 09/22/12  Yes Ileana Ladd, MD     ROS: As above in the HPI. All other systems are stable or negative.  OBJECTIVE: APPEARANCE:  Patient in no acute distress.The patient appeared well nourished and normally developed. Acyanotic. Waist: VITAL SIGNS:BP 147/88  Pulse 84  Temp(Src) 98.9 F (37.2 C) (Oral)  Ht 5\' 3"  (1.6 m)  Wt 175  lb 12.8 oz (79.742 kg)  BMI 31.15 kg/m2  WM  SKIN: warm and  Dry without overt rashes, tattoos and scars  HEAD and Neck: without JVD, Head and scalp: normal Eyes:No scleral icterus. Fundi normal, eye movements normal. Ears: Auricle normal, canal normal, Tympanic membranes normal, insufflation normal. Nose: normal Throat: normal Neck & thyroid: normal  CHEST & LUNGS: Chest wall: normal Lungs: Clear  CVS: Reveals the PMI to be normally located. Regular rhythm, First and Second Heart sounds are normal,  absence of murmurs, rubs or gallops. Peripheral vasculature: Radial pulses: normal Dorsal pedis pulses: normal Posterior pulses: normal  ABDOMEN:  Appearance: normal Benign, no organomegaly, no masses, no Abdominal Aortic enlargement. No Guarding , no rebound. No Bruits. Bowel sounds: normal  RECTAL: N/A GU: N/A  EXTREMETIES: nonedematous. Both Femoral and Pedal pulses are normal.  MUSCULOSKELETAL:  Spine: normal Joints: intact  NEUROLOGIC: oriented to time,place and person; nonfocal. Strength is normal Sensory is normal Reflexes are normal Cranial Nerves are normal.   Results for orders placed in visit on 06/07/12  PSA      Result Value Range   PSA 0.82  <=4.00 ng/mL    ASSESSMENT: Need for Tdap vaccination - Plan: Tdap vaccine greater than or equal to 7yo IM  Anxiety  HTN (hypertension) - Plan: hydrochlorothiazide (HYDRODIURIL) 25 MG tablet, CMP14+EGFR  Hyperlipidemia - Plan: CMP14+EGFR, NMR, lipoprofile, pravastatin (PRAVACHOL) 40 MG tablet  Palpitations  Unspecified essential hypertension - Plan: hydrochlorothiazide (HYDRODIURIL) 25 MG tablet  Generalized anxiety disorder - Plan: PARoxetine (PAXIL) 10 MG tablet, Doxepin HCl 6 MG TABS, TSH  Insomnia - Plan: Doxepin HCl 6 MG TABS, TSH   PLAN: Orders Placed This Encounter  Procedures  . Tdap vaccine greater than or equal to 7yo IM  . CMP14+EGFR  . NMR, lipoprofile  . TSH    Meds ordered  this encounter  Medications  . DISCONTD: doxylamine, Sleep, (UNISOM) 25 MG tablet    Sig: Take 25 mg by mouth at bedtime as needed for sleep.  . hydrochlorothiazide (HYDRODIURIL) 25 MG tablet    Sig: Take 1 tablet (25 mg total) by mouth daily.    Dispense:  30 tablet    Refill:  11  . pravastatin (PRAVACHOL) 40 MG tablet    Sig: TAKE 1 TABLET (40 MG TOTAL) BY MOUTH DAILY.    Dispense:  30 tablet    Refill:  11  . PARoxetine (PAXIL) 10 MG tablet    Sig: Take 1 tablet (10 mg total) by mouth daily.    Dispense:  30 tablet    Refill:  11  . Doxepin HCl 6 MG TABS  Sig: Take 1 tablet (6 mg total) by mouth at bedtime.    Dispense:  30 tablet    Refill:  5         Dr Woodroe Mode Recommendations  Diet and Exercise discussed with patient.  For nutrition information, I recommend books:  1).Eat to Live by Dr Monico Hoar. 2).Prevent and Reverse Heart Disease by Dr Suzzette Righter. 3) Dr Katherina Right Book:  Program to Reverse Diabetes  Exercise recommendations are:  If unable to walk, then the patient can exercise in a chair 3 times a day. By flapping arms like a bird gently and raising legs outwards to the front.  If ambulatory, the patient can go for walks for 30 minutes 3 times a week. Then increase the intensity and duration as tolerated.  Goal is to try to attain exercise frequency to 5 times a week.  If applicable: Best to perform resistance exercises (machines or weights) 2 days a week and cardio type exercises 3 days per week.  Return in about 3 months (around 01/28/2013) for Recheck medical problems.  Liberato Stansbery P. Modesto Charon, M.D.

## 2012-10-28 NOTE — Progress Notes (Signed)
Tolerated tdap injection without difficultly

## 2012-10-31 LAB — CMP14+EGFR
ALT: 51 IU/L — ABNORMAL HIGH (ref 0–44)
AST: 26 IU/L (ref 0–40)
Albumin/Globulin Ratio: 1.7 (ref 1.1–2.5)
Albumin: 4.8 g/dL (ref 3.5–5.5)
Alkaline Phosphatase: 62 IU/L (ref 39–117)
BUN/Creatinine Ratio: 19 (ref 9–20)
BUN: 20 mg/dL (ref 6–24)
CO2: 30 mmol/L — ABNORMAL HIGH (ref 18–29)
Calcium: 10.2 mg/dL (ref 8.7–10.2)
Chloride: 97 mmol/L (ref 97–108)
Creatinine, Ser: 1.06 mg/dL (ref 0.76–1.27)
GFR calc Af Amer: 91 mL/min/{1.73_m2} (ref >59)
GFR calc non Af Amer: 79 mL/min/{1.73_m2} (ref >59)
Globulin, Total: 2.9 g/dL (ref 1.5–4.5)
Glucose: 109 mg/dL — ABNORMAL HIGH (ref 65–99)
Potassium: 4.2 mmol/L (ref 3.5–5.2)
Sodium: 141 mmol/L (ref 134–144)
Total Bilirubin: 0.4 mg/dL (ref 0.0–1.2)
Total Protein: 7.7 g/dL (ref 6.0–8.5)

## 2012-10-31 LAB — NMR, LIPOPROFILE
Cholesterol: 172 mg/dL (ref <200)
HDL Cholesterol by NMR: 59 mg/dL (ref >=40)
HDL Particle Number: 42 umol/L (ref >=30.5)
LDL Particle Number: 1585 nmol/L — ABNORMAL HIGH (ref <1000)
LDL Size: 20.7 nm (ref >20.5)
LDLC SERPL CALC-MCNC: 87 mg/dL (ref <100)
LP-IR Score: 74 — ABNORMAL HIGH (ref <=45)
Small LDL Particle Number: 859 nmol/L — ABNORMAL HIGH (ref <=527)
Triglycerides by NMR: 132 mg/dL (ref <150)

## 2012-10-31 LAB — TSH: TSH: 2.83 u[IU]/mL (ref 0.450–4.500)

## 2012-11-02 ENCOUNTER — Telehealth: Payer: Self-pay | Admitting: Family Medicine

## 2012-11-02 NOTE — Telephone Encounter (Signed)
Pt notifed and lab results given

## 2012-11-07 ENCOUNTER — Telehealth: Payer: Self-pay

## 2012-11-07 NOTE — Telephone Encounter (Signed)
Wife called and stated dr Mod[place] Charon gave new med and insurance needs Prior Authorization".  [facility] spoke to [place] the med was "Silenor"   Wife notified and informed Derry Skill does our PA and will leave her message regarding this and wife will call in am for status of the prior.

## 2012-11-08 ENCOUNTER — Telehealth: Payer: Self-pay | Admitting: Family Medicine

## 2012-11-08 NOTE — Telephone Encounter (Signed)
We can call in generic doxepin 10 mg po hs. That should pass. # 30 refill x 2. Thanks. Tye Juarez P. [place] Charon, M.D.

## 2012-11-08 NOTE — Telephone Encounter (Signed)
Ins company has denied coverage of silenor until pt has tried either Zolpidem or Zaleplon and failed can you take care of this please, I will notify pt that you are calling in something else. Thank you in advance for your attention.

## 2012-11-08 NOTE — Telephone Encounter (Signed)
Wife aware rx called to [facility]

## 2012-11-24 ENCOUNTER — Telehealth: Payer: Self-pay | Admitting: *Deleted

## 2012-11-24 ENCOUNTER — Telehealth: Payer: Self-pay | Admitting: Family Medicine

## 2012-11-24 NOTE — Telephone Encounter (Signed)
Got another pa for silenor was already denied and another script called in, called pharmacy and they said it had already been taken care of will take out of their computor.

## 2012-11-29 ENCOUNTER — Encounter: Payer: Self-pay | Admitting: *Deleted

## 2012-11-29 NOTE — Telephone Encounter (Signed)
Patient aware.

## 2012-11-29 NOTE — Telephone Encounter (Signed)
Needs to give a couple of weeks to work.if not then we will need to increase the dose.

## 2012-12-18 ENCOUNTER — Other Ambulatory Visit: Payer: Self-pay | Admitting: Family Medicine

## 2013-01-19 ENCOUNTER — Other Ambulatory Visit: Payer: Self-pay

## 2013-01-31 ENCOUNTER — Ambulatory Visit: Payer: BC Managed Care – PPO | Admitting: Family Medicine

## 2013-02-06 ENCOUNTER — Encounter: Payer: Self-pay | Admitting: Family Medicine

## 2013-02-06 ENCOUNTER — Ambulatory Visit (INDEPENDENT_AMBULATORY_CARE_PROVIDER_SITE_OTHER): Payer: BC Managed Care – PPO | Admitting: Family Medicine

## 2013-02-06 VITALS — BP 150/98 | HR 103 | Temp 98.6°F | Ht 63.0 in | Wt 183.4 lb

## 2013-02-06 DIAGNOSIS — I10 Essential (primary) hypertension: Secondary | ICD-10-CM

## 2013-02-06 DIAGNOSIS — F411 Generalized anxiety disorder: Secondary | ICD-10-CM

## 2013-02-06 DIAGNOSIS — R739 Hyperglycemia, unspecified: Secondary | ICD-10-CM | POA: Insufficient documentation

## 2013-02-06 DIAGNOSIS — Z23 Encounter for immunization: Secondary | ICD-10-CM

## 2013-02-06 DIAGNOSIS — E785 Hyperlipidemia, unspecified: Secondary | ICD-10-CM

## 2013-02-06 DIAGNOSIS — R002 Palpitations: Secondary | ICD-10-CM

## 2013-02-06 DIAGNOSIS — F419 Anxiety disorder, unspecified: Secondary | ICD-10-CM

## 2013-02-06 DIAGNOSIS — R7309 Other abnormal glucose: Secondary | ICD-10-CM

## 2013-02-06 LAB — POCT GLYCOSYLATED HEMOGLOBIN (HGB A1C): Hemoglobin A1C: 5.1

## 2013-02-06 MED ORDER — DOXEPIN HCL 25 MG PO CAPS: 25.0000 mg | ORAL_CAPSULE | Freq: Every day | ORAL | Status: DC

## 2013-02-06 MED ORDER — SIMVASTATIN 20 MG PO TABS: 20.0000 mg | ORAL_TABLET | Freq: Every day | ORAL | Status: DC

## 2013-02-06 NOTE — Progress Notes (Signed)
Patient ID: Tyler Googe., male   DOB: Nov 01, 1955, 57 y.o.   MRN: [id number] SUBJECTIVE: CC: Chief Complaint  Patient presents with  . Follow-up    3 month follow up   states not taking pravastatatin 3 weeks ago  "hurting  '    HPI: Patient is here for follow up of hyperlipidemia/HTN/hyperglycemia: denies Headache;denies Chest Pain;denies weakness;denies Shortness of Breath and orthopnea;denies Visual changes;denies palpitations;denies cough;denies pedal edema;denies symptoms of TIA or stroke;deniesClaudication symptoms. admits to Compliance with medications; Problems with medications: stopped the pravastatin due to muscle aches and feeling bad every time when he took it. Wants to try simvastatin instaed Ate country ham last night with high salt and it drove up his BP.  Past Medical History  Diagnosis Date  . IBS (irritable bowel syndrome)   . GAD (generalized anxiety disorder)   . Palpitations   . Hypertension   . Palpitations   . Anxiety     generalized anxiety disorder  . Hyperlipidemia    Past Surgical History  Procedure Laterality Date  . None     History   Social History  . Marital Status: Single    Spouse Name: N/A    Number of Children: N/A  . Years of Education: N/A   Occupational History  . [employer]    Social History Main Topics  . Smoking status: Former Smoker    Types: Cigarettes    Quit date: 12/22/1991  . Smokeless tobacco: Never Used     Comment: Smoked few cigarettes in the past  . Alcohol Use: 1.2 oz/week    2 Cans of beer per week  . Drug Use: No  . Sexual Activity: Not on file   Other Topics Concern  . Not on file   Social History Narrative   Lives with wife.  One child.  One grandchild.     Family History  Problem Relation Age of Onset  . Colon cancer Neg Hx   . Rheum arthritis Mother     Died age 30  . Heart disease Father 5    Valve replacement, died age 50 suicide   Current Outpatient Prescriptions on File Prior to Visit   Medication Sig Dispense Refill  . aspirin 81 MG tablet Take 81 mg by mouth daily.      . hydrochlorothiazide (HYDRODIURIL) 25 MG tablet Take 1 tablet (25 mg total) by mouth daily.  30 tablet  11  . PARoxetine (PAXIL) 10 MG tablet Take 1 tablet (10 mg total) by mouth daily.  30 tablet  11   No current facility-administered medications on file prior to visit.    Immunization History  Administered Date(s) Administered  . Influenza,inj,Quad PF,36+ Mos 02/06/2013  . Tdap 10/28/2012   Prior to Admission medications   Medication Sig Start Date End Date Taking? Authorizing Provider  aspirin 81 MG tablet Take 81 mg by mouth daily.   Yes Historical Provider, MD  doxepin (SINEQUAN) 10 MG capsule Take 10 mg by mouth at bedtime.   Yes Historical Provider, MD  hydrochlorothiazide (HYDRODIURIL) 25 MG tablet Take 1 tablet (25 mg total) by mouth daily. 10/28/12 10/28/13 Yes Ileana Ladd, MD  PARoxetine (PAXIL) 10 MG tablet Take 1 tablet (10 mg total) by mouth daily. 10/28/12  Yes Ileana Ladd, MD  pravastatin (PRAVACHOL) 40 MG tablet TAKE 1 TABLET (40 MG TOTAL) BY MOUTH DAILY. 10/28/12   Ileana Ladd, MD     ROS: As above in the HPI. All other systems  are stable or negative.  OBJECTIVE: APPEARANCE:  Patient in no acute distress.The patient appeared well nourished and normally developed. Acyanotic. Waist: VITAL SIGNS:BP 150/98  Pulse 103  Temp(Src) 98.6 F (37 C) (Oral)  Ht 5\' 3"  (1.6 m)  Wt 183 lb 6.4 oz (83.19 kg)  BMI 32.50 kg/m2  WM obese  SKIN: warm and  Dry without overt rashes, tattoos and scars  HEAD and Neck: without JVD, Head and scalp: normal Eyes:No scleral icterus. Fundi normal, eye movements normal. Ears: Auricle normal, canal normal, Tympanic membranes normal, insufflation normal. Nose: normal Throat: normal Neck & thyroid: normal  CHEST & LUNGS: Chest wall: normal Lungs: Clear  CVS: Reveals the PMI to be normally located. Regular rhythm, First and Second  Heart sounds are normal,  absence of murmurs, rubs or gallops. Peripheral vasculature: Radial pulses: normal Dorsal pedis pulses: normal Posterior pulses: normal  ABDOMEN:  Appearance: obese Benign, no organomegaly, no masses, no Abdominal Aortic enlargement. No Guarding , no rebound. No Bruits. Bowel sounds: normal  RECTAL: N/A GU: N/A  EXTREMETIES: nonedematous.  MUSCULOSKELETAL:  Spine: normal Joints: intact  NEUROLOGIC: oriented to time,place and person; nonfocal. Strength is normal Sensory is normal Reflexes are normal Cranial Nerves are normal.  Results for orders placed in visit on 02/06/13  POCT GLYCOSYLATED HEMOGLOBIN (HGB A1C)      Result Value Range   Hemoglobin A1C 5.1      ASSESSMENT: HTN (hypertension) - Plan: CMP14+EGFR  Hyperlipidemia - Plan: simvastatin (ZOCOR) 20 MG tablet, CMP14+EGFR, NMR, lipoprofile  Anxiety - Plan: doxepin (SINEQUAN) 25 MG capsule  Palpitations  Hyperglycemia - Plan: POCT glycosylated hemoglobin (Hb A1C)  Need for prophylactic vaccination and inoculation against influenza  PLAN:  Orders Placed This Encounter  Procedures  . CMP14+EGFR  . NMR, lipoprofile  . POCT glycosylated hemoglobin (Hb A1C)   Meds ordered this encounter  Medications  . DISCONTD: doxepin (SINEQUAN) 10 MG capsule    Sig:   . DISCONTD: doxepin (SINEQUAN) 10 MG capsule    Sig: Take 10 mg by mouth at bedtime.  . simvastatin (ZOCOR) 20 MG tablet    Sig: Take 1 tablet (20 mg total) by mouth at bedtime.    Dispense:  30 tablet    Refill:  5  . doxepin (SINEQUAN) 25 MG capsule    Sig: Take 1 capsule (25 mg total) by mouth at bedtime.    Dispense:  30 capsule    Refill:  5   Medications Discontinued During This Encounter  Medication Reason  . doxepin (SINEQUAN) 10 MG capsule Duplicate  . Doxepin HCl 6 MG TABS Dose change  . hydrochlorothiazide (HYDRODIURIL) 25 MG tablet Duplicate  . pravastatin (PRAVACHOL) 40 MG tablet Side effect (s)  .  doxepin (SINEQUAN) 10 MG capsule Reorder        Dr Woodroe Mode Recommendations  For nutrition information, I recommend books:  1).Eat to Live by Dr Monico Hoar. 2).Prevent and Reverse Heart Disease by Dr Suzzette Righter. 3) Dr Katherina Right Book:  Program to Reverse Diabetes  Exercise recommendations are:  If unable to walk, then the patient can exercise in a chair 3 times a day. By flapping arms like a bird gently and raising legs outwards to the front.  If ambulatory, the patient can go for walks for 30 minutes 3 times a week. Then increase the intensity and duration as tolerated.  Goal is to try to attain exercise frequency to 5 times a week.  If applicable: Best to perform  resistance exercises (machines or weights) 2 days a week and cardio type exercises 3 days per week.  Diet and weight loss discussion. Medication side effects discussed and risks. Patient prefers to try diet and exercise and weight loss before attempting another medication and any increase in BP medications.  Return in about 3 months (around 05/09/2013) for Recheck medical problems.  Kristian Hazzard P. Modesto Charon, M.D.

## 2013-02-06 NOTE — Progress Notes (Signed)
  Subjective:    Patient ID: Tyler Iha., male    DOB: 10/08/1955, 57 y.o.   MRN: [id number]  HPI  Review of Systems     Objective:   Physical Exam    Assessment & Plan:

## 2013-02-06 NOTE — Patient Instructions (Signed)
      Dr Lynzee Lindquist's Recommendations  For nutrition information, I recommend books:  1).Eat to Live by Dr Joel Fuhrman. 2).Prevent and Reverse Heart Disease by Dr Caldwell Esselstyn. 3) Dr Neal Barnard's Book:  Program to Reverse Diabetes  Exercise recommendations are:  If unable to walk, then the patient can exercise in a chair 3 times a day. By flapping arms like a bird gently and raising legs outwards to the front.  If ambulatory, the patient can go for walks for 30 minutes 3 times a week. Then increase the intensity and duration as tolerated.  Goal is to try to attain exercise frequency to 5 times a week.  If applicable: Best to perform resistance exercises (machines or weights) 2 days a week and cardio type exercises 3 days per week.  

## 2013-02-07 LAB — CMP14+EGFR
ALT: 46 IU/L — ABNORMAL HIGH (ref 0–44)
AST: 30 IU/L (ref 0–40)
Albumin/Globulin Ratio: 1.6 (ref 1.1–2.5)
Albumin: 4.5 g/dL (ref 3.5–5.5)
Alkaline Phosphatase: 58 IU/L (ref 39–117)
BUN/Creatinine Ratio: 15 (ref 9–20)
BUN: 16 mg/dL (ref 6–24)
CO2: 28 mmol/L (ref 18–29)
Calcium: 9.8 mg/dL (ref 8.7–10.2)
Chloride: 96 mmol/L — ABNORMAL LOW (ref 97–108)
Creatinine, Ser: 1.07 mg/dL (ref 0.76–1.27)
GFR calc Af Amer: 90 mL/min/{1.73_m2} (ref >59)
GFR calc non Af Amer: 78 mL/min/{1.73_m2} (ref >59)
Globulin, Total: 2.8 g/dL (ref 1.5–4.5)
Glucose: 96 mg/dL (ref 65–99)
Potassium: 4 mmol/L (ref 3.5–5.2)
Sodium: 137 mmol/L (ref 134–144)
Total Bilirubin: 0.3 mg/dL (ref 0.0–1.2)
Total Protein: 7.3 g/dL (ref 6.0–8.5)

## 2013-02-07 LAB — NMR, LIPOPROFILE
Cholesterol: 264 mg/dL — ABNORMAL HIGH (ref <200)
HDL Cholesterol by NMR: 57 mg/dL (ref >=40)
HDL Particle Number: 40.6 umol/L (ref >=30.5)
LDL Particle Number: 2647 nmol/L — ABNORMAL HIGH (ref <1000)
LDL Size: 21.1 nm (ref >20.5)
LDLC SERPL CALC-MCNC: 172 mg/dL — ABNORMAL HIGH (ref <100)
LP-IR Score: 73 — ABNORMAL HIGH (ref <=45)
Small LDL Particle Number: 976 nmol/L — ABNORMAL HIGH (ref <=527)
Triglycerides by NMR: 176 mg/dL — ABNORMAL HIGH (ref <150)

## 2013-04-28 ENCOUNTER — Encounter: Payer: Self-pay | Admitting: *Deleted

## 2013-05-08 ENCOUNTER — Ambulatory Visit (INDEPENDENT_AMBULATORY_CARE_PROVIDER_SITE_OTHER): Payer: BC Managed Care – PPO | Admitting: Family Medicine

## 2013-05-08 ENCOUNTER — Encounter: Payer: Self-pay | Admitting: Family Medicine

## 2013-05-08 VITALS — BP 151/92 | HR 106 | Temp 97.7°F | Ht 63.0 in | Wt 187.0 lb

## 2013-05-08 DIAGNOSIS — E66811 Obesity, class 1: Secondary | ICD-10-CM | POA: Insufficient documentation

## 2013-05-08 DIAGNOSIS — IMO0001 Reserved for inherently not codable concepts without codable children: Secondary | ICD-10-CM

## 2013-05-08 DIAGNOSIS — F419 Anxiety disorder, unspecified: Secondary | ICD-10-CM

## 2013-05-08 DIAGNOSIS — J309 Allergic rhinitis, unspecified: Secondary | ICD-10-CM

## 2013-05-08 DIAGNOSIS — N529 Male erectile dysfunction, unspecified: Secondary | ICD-10-CM

## 2013-05-08 DIAGNOSIS — M791 Myalgia, unspecified site: Secondary | ICD-10-CM

## 2013-05-08 DIAGNOSIS — F411 Generalized anxiety disorder: Secondary | ICD-10-CM

## 2013-05-08 DIAGNOSIS — I10 Essential (primary) hypertension: Secondary | ICD-10-CM

## 2013-05-08 DIAGNOSIS — E785 Hyperlipidemia, unspecified: Secondary | ICD-10-CM

## 2013-05-08 DIAGNOSIS — E669 Obesity, unspecified: Secondary | ICD-10-CM

## 2013-05-08 MED ORDER — LISINOPRIL 5 MG PO TABS: 5.0000 mg | ORAL_TABLET | Freq: Every day | ORAL | Status: DC

## 2013-05-08 MED ORDER — FLUTICASONE PROPIONATE 50 MCG/ACT NA SUSP: 2.0000 | Freq: Every day | NASAL | Status: DC

## 2013-05-08 NOTE — Progress Notes (Signed)
Patient ID: Tyler Torti., male   DOB: 26-Nov-1955, 58 y.o.   MRN: [id number] SUBJECTIVE: CC: Chief Complaint  Patient presents with  . Follow-up    3 month follow up     HPI: Patient is here for follow up of hyperlipidemia/HTN/Anxiety/Obesity: denies Headache;denies Chest Pain;denies weakness;denies Shortness of Breath and orthopnea;denies Visual changes;denies palpitations;denies cough;denies pedal edema;denies symptoms of TIA or stroke;deniesClaudication symptoms. admits to Compliance with medications; denies Problems with medications. Has gained some weight and so did his BP go up. No symptoms.  Also has nasal congestion and used his wife flonase as a trial and it worked. At th e next visit would like to discuss Low T and ED.   Past Medical History  Diagnosis Date  . IBS (irritable bowel syndrome)   . GAD (generalized anxiety disorder)   . Palpitations   . Hypertension   . Palpitations   . Anxiety     generalized anxiety disorder  . Hyperlipidemia    Past Surgical History  Procedure Laterality Date  . None     History   Social History  . Marital Status: Single    Spouse Name: N/A    Number of Children: N/A  . Years of Education: N/A   Occupational History  . [employer]    Social History Main Topics  . Smoking status: Former Smoker    Types: Cigarettes    Quit date: 12/22/1991  . Smokeless tobacco: Never Used     Comment: Smoked few cigarettes in the past  . Alcohol Use: 1.2 oz/week    2 Cans of beer per week  . Drug Use: No  . Sexual Activity: Not on file   Other Topics Concern  . Not on file   Social History Narrative   Lives with wife.  One child.  One grandchild.     Family History  Problem Relation Age of Onset  . Colon cancer Neg Hx   . Rheum arthritis Mother     Died age 17  . Heart disease Father 54    Valve replacement, died age 43 suicide   Current Outpatient Prescriptions on File Prior to Visit  Medication Sig Dispense Refill   . aspirin 81 MG tablet Take 81 mg by mouth daily.      Marland Kitchen doxepin (SINEQUAN) 25 MG capsule Take 1 capsule (25 mg total) by mouth at bedtime.  30 capsule  5  . hydrochlorothiazide (HYDRODIURIL) 25 MG tablet Take 1 tablet (25 mg total) by mouth daily.  30 tablet  11  . simvastatin (ZOCOR) 20 MG tablet Take 1 tablet (20 mg total) by mouth at bedtime.  30 tablet  5   No current facility-administered medications on file prior to visit.   Allergies  Allergen Reactions  . Pravastatin     Hurting "badly"   Immunization History  Administered Date(s) Administered  . Influenza,inj,Quad PF,36+ Mos 02/06/2013  . Tdap 10/28/2012   Prior to Admission medications   Medication Sig Start Date End Date Taking? Authorizing Provider  aspirin 81 MG tablet Take 81 mg by mouth daily.   Yes Historical Provider, MD  doxepin (SINEQUAN) 25 MG capsule Take 1 capsule (25 mg total) by mouth at bedtime. 02/06/13  Yes Vernie Shanks, MD  hydrochlorothiazide (HYDRODIURIL) 25 MG tablet Take 1 tablet (25 mg total) by mouth daily. 10/28/12 10/28/13 Yes Vernie Shanks, MD  PARoxetine (PAXIL) 10 MG tablet Take 1 tablet (10 mg total) by mouth daily. 10/28/12  Yes Dub Mikes  Darylene Price, MD  simvastatin (ZOCOR) 20 MG tablet Take 1 tablet (20 mg total) by mouth at bedtime. 02/06/13  Yes Vernie Shanks, MD     ROS: As above in the HPI. All other systems are stable or negative.  OBJECTIVE: APPEARANCE:  Patient in no acute distress.The patient appeared well nourished and normally developed. Acyanotic. Waist: VITAL SIGNS:BP 151/92  Pulse 106  Temp(Src) 97.7 F (36.5 C) (Oral)  Ht 5' 3"  (1.6 m)  Wt 187 lb (84.823 kg)  BMI 33.13 kg/m2 WM Obese  SKIN: warm and  Dry without overt rashes, tattoos and scars  HEAD and Neck: without JVD, Head and scalp: normal Eyes:No scleral icterus. Fundi normal, eye movements normal. Ears: Auricle normal, canal normal, Tympanic membranes normal, insufflation normal. Nose: normal Throat:  normal Neck & thyroid: normal  CHEST & LUNGS: Chest wall: normal Lungs: Clear  CVS: Reveals the PMI to be normally located. Regular rhythm, First and Second Heart sounds are normal,  absence of murmurs, rubs or gallops. Peripheral vasculature: Radial pulses: normal Dorsal pedis pulses: normal Posterior pulses: normal  ABDOMEN:  Appearance: Obese Benign, no organomegaly, no masses, no Abdominal Aortic enlargement. No Guarding , no rebound. No Bruits. Bowel sounds: normal  RECTAL: N/A GU: N/A  EXTREMETIES: nonedematous.  MUSCULOSKELETAL:  Spine: normal Joints: intact  NEUROLOGIC: oriented to time,place and person; nonfocal. Strength is normal Sensory is normal Reflexes are normal Cranial Nerves are normal.   Results for orders placed in visit on 02/06/13  CMP14+EGFR      Result Value Ref Range   Glucose 96  65 - 99 mg/dL   BUN 16  6 - 24 mg/dL   Creatinine, Ser 1.07  0.76 - 1.27 mg/dL   GFR calc non Af Amer 78  >59 mL/min/1.73   GFR calc Af Amer 90  >59 mL/min/1.73   BUN/Creatinine Ratio 15  9 - 20   Sodium 137  134 - 144 mmol/L   Potassium 4.0  3.5 - 5.2 mmol/L   Chloride 96 (*) 97 - 108 mmol/L   CO2 28  18 - 29 mmol/L   Calcium 9.8  8.7 - 10.2 mg/dL   Total Protein 7.3  6.0 - 8.5 g/dL   Albumin 4.5  3.5 - 5.5 g/dL   Globulin, Total 2.8  1.5 - 4.5 g/dL   Albumin/Globulin Ratio 1.6  1.1 - 2.5   Total Bilirubin 0.3  0.0 - 1.2 mg/dL   Alkaline Phosphatase 58  39 - 117 IU/L   AST 30  0 - 40 IU/L   ALT 46 (*) 0 - 44 IU/L  NMR, LIPOPROFILE      Result Value Ref Range   LDL Particle Number [id number] (*) <1000 nmol/L   LDLC SERPL CALC-MCNC 172 (*) <100 mg/dL   HDL Cholesterol by NMR 57  >=40 mg/dL   Triglycerides by NMR 176 (*) <150 mg/dL   Cholesterol 264 (*) <200 mg/dL   HDL Particle Number [id number]  >=30.5 umol/L   Small LDL Particle Number [id number] (*) <=527 nmol/L   LDL Size 21.1  >20.5 nm   LP-IR Score 73 (*) <=45  POCT GLYCOSYLATED HEMOGLOBIN (HGB A1C)       Result Value Ref Range   Hemoglobin A1C 5.1      ASSESSMENT:  Hyperlipidemia - Plan: CMP14+EGFR, NMR, lipoprofile  HTN (hypertension) - Plan: CMP14+EGFR, lisinopril (PRINIVIL,ZESTRIL) 5 MG tablet  Anxiety  Obesity (BMI 30.0-34.9)  Allergic rhinitis - Plan: fluticasone (FLONASE) 50 MCG/ACT nasal spray  Generalized muscle ache - Plan: CK, Aldolase, TSH, Vit D  25 hydroxy (rtn osteoporosis monitoring)  Erectile dysfunction - Plan: Testosterone,Free and Total  PLAN: DASH diet       Dr Paula Libra Recommendations  For nutrition information, I recommend books:  1).Eat to Live by Dr Excell Seltzer. 2).Prevent and Reverse Heart Disease by Dr Karl Luke. 3) Dr Janene Harvey Book:  Program to Reverse Diabetes  Exercise recommendations are:  If unable to walk, then the patient can exercise in a chair 3 times a day. By flapping arms like a bird gently and raising legs outwards to the front.  If ambulatory, the patient can go for walks for 30 minutes 3 times a week. Then increase the intensity and duration as tolerated.  Goal is to try to attain exercise frequency to 5 times a week.  If applicable: Best to perform resistance exercises (machines or weights) 2 days a week and cardio type exercises 3 days per week.  Orders Placed This Encounter  Procedures  . CMP14+EGFR  . NMR, lipoprofile  . CK  . Aldolase  . TSH  . Vit D  25 hydroxy (rtn osteoporosis monitoring)  . Testosterone,Free and Total   Meds ordered this encounter  Medications  . fluticasone (FLONASE) 50 MCG/ACT nasal spray    Sig: Place 2 sprays into both nostrils daily.    Dispense:  16 g    Refill:  5  . lisinopril (PRINIVIL,ZESTRIL) 5 MG tablet    Sig: Take 1 tablet (5 mg total) by mouth daily.    Dispense:  90 tablet    Refill:  3   Medications Discontinued During This Encounter  Medication Reason  . PARoxetine (PAXIL) 10 MG tablet Side effect (s)   Return in about 6 weeks (around 06/19/2013)  for recheck BP.  Dalexa Gentz P. Jacelyn Grip, M.D.

## 2013-05-08 NOTE — Patient Instructions (Signed)
    Dr Jayona Mccaig's Recommendations  For nutrition information, I recommend books:  1).Eat to Live by Dr Joel Fuhrman. 2).Prevent and Reverse Heart Disease by Dr Caldwell Esselstyn. 3) Dr Neal Barnard's Book:  Program to Reverse Diabetes  Exercise recommendations are:  If unable to walk, then the patient can exercise in a chair 3 times a day. By flapping arms like a bird gently and raising legs outwards to the front.  If ambulatory, the patient can go for walks for 30 minutes 3 times a week. Then increase the intensity and duration as tolerated.  Goal is to try to attain exercise frequency to 5 times a week.  If applicable: Best to perform resistance exercises (machines or weights) 2 days a week and cardio type exercises 3 days per week.  DASH Diet The DASH diet stands for "Dietary Approaches to Stop Hypertension." It is a healthy eating plan that has been shown to reduce high blood pressure (hypertension) in as little as 14 days, while also possibly providing other significant health benefits. These other health benefits include reducing the risk of breast cancer after menopause and reducing the risk of type 2 diabetes, heart disease, colon cancer, and stroke. Health benefits also include weight loss and slowing kidney failure in patients with chronic kidney disease.  DIET GUIDELINES  Limit salt (sodium). Your diet should contain less than 1500 mg of sodium daily.  Limit refined or processed carbohydrates. Your diet should include mostly whole grains. Desserts and added sugars should be used sparingly.  Include small amounts of heart-healthy fats. These types of fats include nuts, oils, and tub margarine. Limit saturated and trans fats. These fats have been shown to be harmful in the body. CHOOSING FOODS  The following food groups are based on a 2000 calorie diet. See your Registered Dietitian for individual calorie needs. Grains and Grain Products (6 to 8 servings daily)  Eat More  Often: Whole-wheat bread, brown rice, whole-grain or wheat pasta, quinoa, popcorn without added fat or salt (air popped).  Eat Less Often: White bread, white pasta, white rice, cornbread. Vegetables (4 to 5 servings daily)  Eat More Often: Fresh, frozen, and canned vegetables. Vegetables may be raw, steamed, roasted, or grilled with a minimal amount of fat.  Eat Less Often/Avoid: Creamed or fried vegetables. Vegetables in a cheese sauce. Fruit (4 to 5 servings daily)  Eat More Often: All fresh, canned (in natural juice), or frozen fruits. Dried fruits without added sugar. One hundred percent fruit juice ( cup [237 mL] daily).  Eat Less Often: Dried fruits with added sugar. Canned fruit in light or heavy syrup. Lean Meats, Fish, and Poultry (2 servings or less daily. One serving is 3 to 4 oz [85-114 g]).  Eat More Often: Ninety percent or leaner ground beef, tenderloin, sirloin. Round cuts of beef, chicken breast, turkey breast. All fish. Grill, bake, or broil your meat. Nothing should be fried.  Eat Less Often/Avoid: Fatty cuts of meat, turkey, or chicken leg, thigh, or wing. Fried cuts of meat or fish. Dairy (2 to 3 servings)  Eat More Often: Low-fat or fat-free milk, low-fat plain or light yogurt, reduced-fat or part-skim cheese.  Eat Less Often/Avoid: Milk (whole, 2%).Whole milk yogurt. Full-fat cheeses. Nuts, Seeds, and Legumes (4 to 5 servings per week)  Eat More Often: All without added salt.  Eat Less Often/Avoid: Salted nuts and seeds, canned beans with added salt. Fats and Sweets (limited)  Eat More Often: Vegetable oils, tub margarines   without trans fats, sugar-free gelatin. Mayonnaise and salad dressings.  Eat Less Often/Avoid: Coconut oils, palm oils, butter, stick margarine, cream, half and half, cookies, candy, pie. FOR MORE INFORMATION The Dash Diet Eating Plan: www.dashdiet.org Document Released: 02/19/2011 Document Revised: 05/25/2011 Document Reviewed:  02/19/2011 [employer] Patient Information 2014 [employer].  

## 2013-05-09 ENCOUNTER — Ambulatory Visit: Payer: BC Managed Care – PPO | Admitting: Family Medicine

## 2013-05-10 LAB — ALDOLASE: Aldolase: 5.1 U/L (ref 3.3–10.3)

## 2013-05-10 LAB — CMP14+EGFR
ALT: 52 IU/L — ABNORMAL HIGH (ref 0–44)
AST: 26 IU/L (ref 0–40)
Albumin/Globulin Ratio: 1.7 (ref 1.1–2.5)
Albumin: 4.7 g/dL (ref 3.5–5.5)
Alkaline Phosphatase: 60 IU/L (ref 39–117)
BUN/Creatinine Ratio: 15 (ref 9–20)
BUN: 18 mg/dL (ref 6–24)
CO2: 27 mmol/L (ref 18–29)
Calcium: 10 mg/dL (ref 8.7–10.2)
Chloride: 95 mmol/L — ABNORMAL LOW (ref 97–108)
Creatinine, Ser: 1.21 mg/dL (ref 0.76–1.27)
GFR calc Af Amer: 77 mL/min/{1.73_m2} (ref >59)
GFR calc non Af Amer: 67 mL/min/{1.73_m2} (ref >59)
Globulin, Total: 2.7 g/dL (ref 1.5–4.5)
Glucose: 108 mg/dL — ABNORMAL HIGH (ref 65–99)
Potassium: 4.1 mmol/L (ref 3.5–5.2)
Sodium: 141 mmol/L (ref 134–144)
Total Bilirubin: 0.5 mg/dL (ref 0.0–1.2)
Total Protein: 7.4 g/dL (ref 6.0–8.5)

## 2013-05-10 LAB — NMR, LIPOPROFILE
Cholesterol: 164 mg/dL (ref <200)
HDL Cholesterol by NMR: 49 mg/dL (ref >=40)
HDL Particle Number: 41.9 umol/L (ref >=30.5)
LDL Particle Number: 1897 nmol/L — ABNORMAL HIGH (ref <1000)
LDL Size: 20.6 nm (ref >20.5)
LDLC SERPL CALC-MCNC: 83 mg/dL (ref <100)
LP-IR Score: 76 — ABNORMAL HIGH (ref <=45)
Small LDL Particle Number: 1177 nmol/L — ABNORMAL HIGH (ref <=527)
Triglycerides by NMR: 161 mg/dL — ABNORMAL HIGH (ref <150)

## 2013-05-10 LAB — TSH: TSH: 6.14 u[IU]/mL — ABNORMAL HIGH (ref 0.450–4.500)

## 2013-05-10 LAB — TESTOSTERONE,FREE AND TOTAL
Testosterone, Free: <0.2 pg/mL — ABNORMAL LOW (ref 7.2–24.0)
Testosterone: 204 ng/dL — ABNORMAL LOW (ref 348–1197)

## 2013-05-10 LAB — VITAMIN D 25 HYDROXY (VIT D DEFICIENCY, FRACTURES): Vit D, 25-Hydroxy: 27 ng/mL — ABNORMAL LOW (ref 30.0–100.0)

## 2013-05-10 LAB — CK: Total CK: 125 U/L (ref 24–204)

## 2013-05-11 ENCOUNTER — Other Ambulatory Visit: Payer: Self-pay | Admitting: Family Medicine

## 2013-05-11 DIAGNOSIS — R7989 Other specified abnormal findings of blood chemistry: Secondary | ICD-10-CM

## 2013-05-11 NOTE — Progress Notes (Signed)
Quick Note:  Call Patient Labs that are abnormal: LIpids are still abnormal The thyroid is abnormal. He is hypothyroid and this may cause the muscle aches. The vitamin D was low. The testosterone was still low. The muscle enzymes were fine  Recommendations: Will need to do a full thyroid panel. Ordered in [employer] on Vitamin D 2,000 units daily. Will start on thyroid medications as soon as I have the thyroid panel results. Plant based diet. Hypothyroidism would affect the lipids as well.   ______

## 2013-05-30 ENCOUNTER — Telehealth: Payer: Self-pay | Admitting: Family Medicine

## 2013-06-02 ENCOUNTER — Other Ambulatory Visit (INDEPENDENT_AMBULATORY_CARE_PROVIDER_SITE_OTHER): Payer: BC Managed Care – PPO

## 2013-06-02 DIAGNOSIS — R7989 Other specified abnormal findings of blood chemistry: Secondary | ICD-10-CM

## 2013-06-02 DIAGNOSIS — R946 Abnormal results of thyroid function studies: Secondary | ICD-10-CM

## 2013-06-02 NOTE — Progress Notes (Signed)
Pt came in for labs only 

## 2013-06-03 LAB — THYROID PANEL WITH TSH
Free Thyroxine Index: 1.6 (ref 1.2–4.9)
T3 Uptake Ratio: 27 % (ref 24–39)
T4, Total: 6 ug/dL (ref 4.5–12.0)
TSH: 3.27 u[IU]/mL (ref 0.450–4.500)

## 2013-06-03 NOTE — Progress Notes (Signed)
Quick Note:  Call patient. Labs normal. No change in original plan. No need for thyroid medicines. ______

## 2013-06-05 NOTE — Telephone Encounter (Signed)
Pt.notified

## 2013-06-20 ENCOUNTER — Encounter: Payer: Self-pay | Admitting: Family Medicine

## 2013-06-20 ENCOUNTER — Ambulatory Visit (INDEPENDENT_AMBULATORY_CARE_PROVIDER_SITE_OTHER): Payer: BC Managed Care – PPO | Admitting: Family Medicine

## 2013-06-20 VITALS — BP 132/87 | HR 109 | Temp 98.7°F | Ht 63.0 in | Wt 189.4 lb

## 2013-06-20 DIAGNOSIS — E291 Testicular hypofunction: Secondary | ICD-10-CM

## 2013-06-20 DIAGNOSIS — E559 Vitamin D deficiency, unspecified: Secondary | ICD-10-CM | POA: Insufficient documentation

## 2013-06-20 DIAGNOSIS — E785 Hyperlipidemia, unspecified: Secondary | ICD-10-CM

## 2013-06-20 DIAGNOSIS — I10 Essential (primary) hypertension: Secondary | ICD-10-CM

## 2013-06-20 DIAGNOSIS — K760 Fatty (change of) liver, not elsewhere classified: Secondary | ICD-10-CM | POA: Insufficient documentation

## 2013-06-20 DIAGNOSIS — E669 Obesity, unspecified: Secondary | ICD-10-CM

## 2013-06-20 DIAGNOSIS — R7989 Other specified abnormal findings of blood chemistry: Secondary | ICD-10-CM | POA: Insufficient documentation

## 2013-06-20 DIAGNOSIS — R7309 Other abnormal glucose: Secondary | ICD-10-CM

## 2013-06-20 DIAGNOSIS — E66811 Obesity, class 1: Secondary | ICD-10-CM

## 2013-06-20 DIAGNOSIS — R739 Hyperglycemia, unspecified: Secondary | ICD-10-CM

## 2013-06-20 DIAGNOSIS — K7689 Other specified diseases of liver: Secondary | ICD-10-CM

## 2013-06-20 NOTE — Patient Instructions (Signed)
claritin daily until mid-June. Use the flonase. If the soreness in the throat doesn't go away call and we will set up a CT scan of the neck.

## 2013-06-20 NOTE — Progress Notes (Signed)
Patient ID: Tyler Pruitt., male   DOB: 1956/01/30, 58 y.o.   MRN: [id number] SUBJECTIVE: CC: Chief Complaint  Patient presents with  . Follow-up    6 wk reck BP     HPI: Patient is here for follow up of hypertension: denies Headache;deniesChest Pain;denies weakness;denies Shortness of Breath or Orthopnea;denies Visual changes;denies palpitations;denies cough;denies pedal edema;denies symptoms of TIA or stroke; admits to Compliance with medications. denies Problems with medications.  Obesity: now weather is warmer plans to exercise. Needs to make the dietary changes.  Has a soreness in the right side of the neck. When he swallows. Has allergy flares. Wife had similar symptoms and it resolved with antibiotics. No fever. Sx persisting for 3 weeks.  Fatty liver. Knows he needs to make dietary changes.  Hyperlipidemia: results reviewed.  LOW Testosterone: will try to exercise and lose weight and recheck later. Aware of testosterone replacement treatment side effects and prefers to wait.  Past Medical History  Diagnosis Date  . IBS (irritable bowel syndrome)   . GAD (generalized anxiety disorder)   . Palpitations   . Hypertension   . Palpitations   . Anxiety     generalized anxiety disorder  . Hyperlipidemia   . Fatty liver   . Low testosterone   . Vitamin D deficiency    Past Surgical History  Procedure Laterality Date  . None     History   Social History  . Marital Status: Single    Spouse Name: N/A    Number of Children: N/A  . Years of Education: N/A   Occupational History  . [employer]    Social History Main Topics  . Smoking status: Former Smoker    Types: Cigarettes    Quit date: 12/22/1991  . Smokeless tobacco: Never Used     Comment: Smoked few cigarettes in the past  . Alcohol Use: 1.2 oz/week    2 Cans of beer per week  . Drug Use: No  . Sexual Activity: Not on file   Other Topics Concern  . Not on file   Social History Narrative   Lives with wife.  One child.  One grandchild.     Family History  Problem Relation Age of Onset  . Colon cancer Neg Hx   . Rheum arthritis Mother     Died age 70  . Heart disease Father 90    Valve replacement, died age 33 suicide   Current Outpatient Prescriptions on File Prior to Visit  Medication Sig Dispense Refill  . aspirin 81 MG tablet Take 81 mg by mouth daily.      Marland Kitchen doxepin (SINEQUAN) 25 MG capsule Take 1 capsule (25 mg total) by mouth at bedtime.  30 capsule  5  . fluticasone (FLONASE) 50 MCG/ACT nasal spray Place 2 sprays into both nostrils daily.  16 g  5  . hydrochlorothiazide (HYDRODIURIL) 25 MG tablet Take 1 tablet (25 mg total) by mouth daily.  30 tablet  11  . lisinopril (PRINIVIL,ZESTRIL) 5 MG tablet Take 1 tablet (5 mg total) by mouth daily.  90 tablet  3  . simvastatin (ZOCOR) 20 MG tablet Take 1 tablet (20 mg total) by mouth at bedtime.  30 tablet  5   No current facility-administered medications on file prior to visit.   Allergies  Allergen Reactions  . Pravastatin     Hurting "badly"   Immunization History  Administered Date(s) Administered  . Influenza,inj,Quad PF,36+ Mos 02/06/2013  . Tdap 10/28/2012  Prior to Admission medications   Medication Sig Start Date End Date Taking? Authorizing Provider  aspirin 81 MG tablet Take 81 mg by mouth daily.   Yes Historical Provider, MD  doxepin (SINEQUAN) 25 MG capsule Take 1 capsule (25 mg total) by mouth at bedtime. 02/06/13  Yes Vernie Shanks, MD  fluticasone (FLONASE) 50 MCG/ACT nasal spray Place 2 sprays into both nostrils daily. 05/08/13  Yes Vernie Shanks, MD  hydrochlorothiazide (HYDRODIURIL) 25 MG tablet Take 1 tablet (25 mg total) by mouth daily. 10/28/12 10/28/13 Yes Vernie Shanks, MD  lisinopril (PRINIVIL,ZESTRIL) 5 MG tablet Take 1 tablet (5 mg total) by mouth daily. 05/08/13  Yes Vernie Shanks, MD  simvastatin (ZOCOR) 20 MG tablet Take 1 tablet (20 mg total) by mouth at bedtime. 02/06/13  Yes Vernie Shanks, MD     ROS: As above in the HPI. All other systems are stable or negative.  OBJECTIVE: APPEARANCE:  Patient in no acute distress.The patient appeared well nourished and normally developed. Acyanotic. Waist: VITAL SIGNS:BP 132/87  Pulse 109  Temp(Src) 98.7 F (37.1 C) (Oral)  Ht 5' 3"  (1.6 m)  Wt 189 lb 6.4 oz (85.911 kg)  BMI 33.56 kg/m2 Obese WM   SKIN: warm and  Dry without overt rashes, tattoos and scars  HEAD and Neck: without JVD, Head and scalp: normal Eyes:No scleral icterus. Fundi normal, eye movements normal. Ears: Auricle normal, canal normal, Tympanic membranes normal, insufflation normal. Nose: normal Throat: normal Neck : right side of the neck at the superior pole of the right lobe of the  Thyroid is a little tender. No Lymph nodes felt.  thyroid: normal  CHEST & LUNGS: Chest wall: normal Lungs: Clear  CVS: Reveals the PMI to be normally located. Regular rhythm, First and Second Heart sounds are normal,  absence of murmurs, rubs or gallops. Peripheral vasculature: Radial pulses: normal Dorsal pedis pulses: normal Posterior pulses: normal  ABDOMEN:  Appearance: Obese. Benign, no organomegaly, no masses, no Abdominal Aortic enlargement. No Guarding , no rebound. No Bruits. Bowel sounds: normal  RECTAL: N/A GU: N/A  EXTREMETIES: nonedematous.  MUSCULOSKELETAL:  Spine: normal Joints: intact  NEUROLOGIC: oriented to time,place and person; nonfocal. Strength is normal Sensory is normal Reflexes are normal Cranial Nerves are normal. Results for orders placed in visit on 06/02/13  THYROID PANEL WITH TSH      Result Value Ref Range   TSH 3.270  0.450 - 4.500 uIU/mL   T4, Total 6.0  4.5 - 12.0 ug/dL   T3 Uptake Ratio 27  24 - 39 %   Free Thyroxine Index 1.6  1.2 - 4.9    ASSESSMENT: Obesity (BMI 30.0-34.9)  Hyperglycemia  Hyperlipidemia  HTN (hypertension) - Plan: BMP8+EGFR  Vitamin D deficiency - Plan: Vit D  25 hydroxy  (rtn osteoporosis monitoring)  Fatty liver  Low testosterone  PLAN: Diet exercise weight loss. offerred CT neck patient declined. claritin daily until mid-June. Use the flonase. If the soreness in the throat doesn't go away call and we will set up a CT scan of the neck. Reviewed labs with patient.  Orders Placed This Encounter  Procedures  . BMP8+EGFR  . Vit D  25 hydroxy (rtn osteoporosis monitoring)   No orders of the defined types were placed in this encounter.   There are no discontinued medications. Return in about 3 months (around 09/19/2013) for Recheck medical problems.  Ronon Ferger P. Jacelyn Grip, M.D.

## 2013-06-21 LAB — BMP8+EGFR
BUN/Creatinine Ratio: 17 (ref 9–20)
BUN: 19 mg/dL (ref 6–24)
CO2: 28 mmol/L (ref 18–29)
Calcium: 11 mg/dL — ABNORMAL HIGH (ref 8.7–10.2)
Chloride: 96 mmol/L — ABNORMAL LOW (ref 97–108)
Creatinine, Ser: 1.1 mg/dL (ref 0.76–1.27)
GFR calc Af Amer: 87 mL/min/{1.73_m2} (ref >59)
GFR calc non Af Amer: 75 mL/min/{1.73_m2} (ref >59)
Glucose: 96 mg/dL (ref 65–99)
Potassium: 4.5 mmol/L (ref 3.5–5.2)
Sodium: 143 mmol/L (ref 134–144)

## 2013-06-21 LAB — VITAMIN D 25 HYDROXY (VIT D DEFICIENCY, FRACTURES): Vit D, 25-Hydroxy: 32.1 ng/mL (ref 30.0–100.0)

## 2013-06-25 ENCOUNTER — Other Ambulatory Visit: Payer: Self-pay | Admitting: Family Medicine

## 2013-06-25 DIAGNOSIS — R899 Unspecified abnormal finding in specimens from other organs, systems and tissues: Secondary | ICD-10-CM

## 2013-06-25 NOTE — Progress Notes (Signed)
Quick Note:  Call Patient Labs that are abnormal: Calcium is high  The rest are at goal  Recommendations: Need to repeat and check the parathyroid gland.ordered to be done in 1 week.   ______

## 2013-06-27 ENCOUNTER — Other Ambulatory Visit (INDEPENDENT_AMBULATORY_CARE_PROVIDER_SITE_OTHER): Payer: BC Managed Care – PPO

## 2013-06-27 DIAGNOSIS — R899 Unspecified abnormal finding in specimens from other organs, systems and tissues: Secondary | ICD-10-CM

## 2013-06-27 DIAGNOSIS — R6889 Other general symptoms and signs: Secondary | ICD-10-CM

## 2013-06-27 NOTE — Progress Notes (Signed)
Pt came in for labs only 

## 2013-06-29 LAB — PTH, INTACT AND CALCIUM
Calcium: 9.3 mg/dL (ref 8.7–10.2)
PTH: 22 pg/mL (ref 15–65)

## 2013-06-29 LAB — CALCIUM, IONIZED: Calcium, Ion: 4.8 mg/dL (ref 4.5–5.6)

## 2013-06-29 NOTE — Progress Notes (Signed)
Quick Note:  Call patient. Labs normal.repeat calcium was normal No change in plan. ______

## 2013-07-22 ENCOUNTER — Other Ambulatory Visit: Payer: Self-pay | Admitting: Family Medicine

## 2013-08-04 ENCOUNTER — Other Ambulatory Visit: Payer: Self-pay | Admitting: Family Medicine

## 2013-08-07 ENCOUNTER — Other Ambulatory Visit: Payer: Self-pay | Admitting: *Deleted

## 2013-08-07 DIAGNOSIS — F419 Anxiety disorder, unspecified: Secondary | ICD-10-CM

## 2013-08-07 MED ORDER — DOXEPIN HCL 25 MG PO CAPS: 25.0000 mg | ORAL_CAPSULE | Freq: Every day | ORAL | Status: DC

## 2013-08-18 ENCOUNTER — Other Ambulatory Visit: Payer: Self-pay | Admitting: Family Medicine

## 2013-09-03 ENCOUNTER — Other Ambulatory Visit: Payer: Self-pay | Admitting: *Deleted

## 2013-09-03 DIAGNOSIS — E785 Hyperlipidemia, unspecified: Secondary | ICD-10-CM

## 2013-09-03 MED ORDER — SIMVASTATIN 20 MG PO TABS: 20.0000 mg | ORAL_TABLET | Freq: Every day | ORAL | Status: DC

## 2013-11-02 ENCOUNTER — Other Ambulatory Visit: Payer: Self-pay | Admitting: Family Medicine

## 2013-12-05 ENCOUNTER — Ambulatory Visit: Payer: BC Managed Care – PPO | Admitting: Family Medicine

## 2013-12-05 ENCOUNTER — Other Ambulatory Visit: Payer: Self-pay | Admitting: Family Medicine

## 2013-12-20 ENCOUNTER — Encounter: Payer: Self-pay | Admitting: Family Medicine

## 2013-12-20 ENCOUNTER — Ambulatory Visit (INDEPENDENT_AMBULATORY_CARE_PROVIDER_SITE_OTHER): Payer: BC Managed Care – PPO | Admitting: Family Medicine

## 2013-12-20 VITALS — BP 122/77 | HR 95 | Temp 98.7°F | Ht 63.0 in | Wt 192.6 lb

## 2013-12-20 DIAGNOSIS — I10 Essential (primary) hypertension: Secondary | ICD-10-CM

## 2013-12-20 DIAGNOSIS — E669 Obesity, unspecified: Secondary | ICD-10-CM

## 2013-12-20 DIAGNOSIS — E041 Nontoxic single thyroid nodule: Secondary | ICD-10-CM

## 2013-12-20 DIAGNOSIS — E785 Hyperlipidemia, unspecified: Secondary | ICD-10-CM

## 2013-12-20 NOTE — Progress Notes (Signed)
   Subjective:    Patient ID: Tyler Pruitt., male    DOB: 24-Oct-1955, 58 y.o.   MRN: [id number]  HPI 58 year old here to followup lipids and hypertension. It has been about 18 months since he had blood work but has continued to take the Zocor to He has a concern about a vague feeling of the right side of his neck and the anterior cervical region. He denies any hearing symptoms or dysgeusia    Review of Systems  Constitutional: Negative.   HENT: Positive for sore throat.   Eyes: Negative.   Respiratory: Negative.   Cardiovascular: Negative.   Genitourinary: Negative.   Musculoskeletal: Negative.   Neurological: Negative.        Objective:   Physical Exam  Constitutional: He is oriented to person, place, and time. He appears well-developed and well-nourished.  HENT:  Head: Normocephalic.  Right Ear: External ear normal.  Left Ear: External ear normal.  Nose: Nose normal.  Mouth/Throat: Oropharynx is clear and moist.  Eyes: Conjunctivae and EOM are normal. Pupils are equal, round, and reactive to light.  Neck: Normal range of motion. Neck supple.  ? Nodule on R side thyroid  Cardiovascular: Normal rate, regular rhythm, normal heart sounds and intact distal pulses.   Pulmonary/Chest: Effort normal and breath sounds normal.  Abdominal: Soft. Bowel sounds are normal.  Musculoskeletal: Normal range of motion.  Neurological: He is alert and oriented to person, place, and time.  Skin: Skin is warm and dry.  Psychiatric: He has a normal mood and affect. His behavior is normal. Judgment and thought content normal.   BP 122/77  Pulse 95  Temp(Src) 98.7 F (37.1 C) (Oral)  Ht $R'5\' 3"'mb$  (1.6 m)  Wt 192 lb 9.6 oz (87.363 kg)  BMI 34.13 kg/m2        Assessment & Plan:  1. Hyperlipemia  - Lipid panel - CMP14+EGFR  2. Thyroid nodule May need [place] or scan - Ambulatory referral to ENT  3. Essential hypertension BP controlled  4. Hyperlipidemia   5. Obesity (BMI  30.0-34.9) Encouraged weight loss  Wardell Honour MD

## 2013-12-21 LAB — CMP14+EGFR
ALT: 46 IU/L — ABNORMAL HIGH (ref 0–44)
AST: 31 IU/L (ref 0–40)
Albumin/Globulin Ratio: 1.5 (ref 1.1–2.5)
Albumin: 4.5 g/dL (ref 3.5–5.5)
Alkaline Phosphatase: 63 IU/L (ref 39–117)
BUN/Creatinine Ratio: 15 (ref 9–20)
BUN: 16 mg/dL (ref 6–24)
CO2: 27 mmol/L (ref 18–29)
Calcium: 9.9 mg/dL (ref 8.7–10.2)
Chloride: 93 mmol/L — ABNORMAL LOW (ref 97–108)
Creatinine, Ser: 1.08 mg/dL (ref 0.76–1.27)
GFR calc Af Amer: 88 mL/min/{1.73_m2} (ref >59)
GFR calc non Af Amer: 76 mL/min/{1.73_m2} (ref >59)
Globulin, Total: 3 g/dL (ref 1.5–4.5)
Glucose: 92 mg/dL (ref 65–99)
Potassium: 4 mmol/L (ref 3.5–5.2)
Sodium: 137 mmol/L (ref 134–144)
Total Bilirubin: 0.4 mg/dL (ref 0.0–1.2)
Total Protein: 7.5 g/dL (ref 6.0–8.5)

## 2013-12-21 LAB — LIPID PANEL
Chol/HDL Ratio: 3 ratio units (ref 0.0–5.0)
Cholesterol, Total: 160 mg/dL (ref 100–199)
HDL: 53 mg/dL (ref >39)
LDL Calculated: 85 mg/dL (ref 0–99)
Triglycerides: 108 mg/dL (ref 0–149)
VLDL Cholesterol Cal: 22 mg/dL (ref 5–40)

## 2013-12-27 ENCOUNTER — Telehealth: Payer: Self-pay | Admitting: Family Medicine

## 2013-12-27 NOTE — Telephone Encounter (Signed)
Message copied by Waverly Ferrari on Wed Dec 27, 2013 10:43 AM ------      Message from: Wardell Honour      Created: Thu Dec 21, 2013  7:39 AM       Lipids are in good shape; metabolic panel also normal ------

## 2013-12-27 NOTE — Telephone Encounter (Signed)
Patient aware.

## 2013-12-29 ENCOUNTER — Telehealth: Payer: Self-pay | Admitting: Family Medicine

## 2013-12-29 NOTE — Telephone Encounter (Signed)
Spoke with wife about husbands request. Told her Dr. Wilson Singer is endocrinologist, not ENT. She agreed to refer to ENT and was given a new appointment date/time for husband with Dr. Simeon Craft at [facility]

## 2014-01-04 ENCOUNTER — Other Ambulatory Visit: Payer: Self-pay | Admitting: Otolaryngology

## 2014-01-04 DIAGNOSIS — R221 Localized swelling, mass and lump, neck: Secondary | ICD-10-CM

## 2014-01-06 ENCOUNTER — Other Ambulatory Visit: Payer: Self-pay | Admitting: Family Medicine

## 2014-01-08 ENCOUNTER — Other Ambulatory Visit: Payer: Self-pay

## 2014-01-08 MED ORDER — SIMVASTATIN 20 MG PO TABS: ORAL_TABLET | ORAL | Status: DC

## 2014-01-25 ENCOUNTER — Ambulatory Visit
Admission: RE | Admit: 2014-01-25 | Discharge: 2014-01-25 | Disposition: A | Discharge status: Home / Self Care | Payer: BC Managed Care – PPO | Source: Ambulatory Visit | Attending: Otolaryngology | Admitting: Otolaryngology

## 2014-01-25 DIAGNOSIS — R221 Localized swelling, mass and lump, neck: Secondary | ICD-10-CM

## 2014-02-27 ENCOUNTER — Other Ambulatory Visit: Payer: Self-pay | Admitting: Family Medicine

## 2014-02-28 ENCOUNTER — Other Ambulatory Visit: Payer: Self-pay | Admitting: *Deleted

## 2014-02-28 DIAGNOSIS — I10 Essential (primary) hypertension: Secondary | ICD-10-CM

## 2014-02-28 MED ORDER — LISINOPRIL 5 MG PO TABS: 5.0000 mg | ORAL_TABLET | Freq: Every day | ORAL | Status: DC

## 2014-04-03 ENCOUNTER — Other Ambulatory Visit: Payer: Self-pay | Admitting: *Deleted

## 2014-04-03 MED ORDER — DOXEPIN HCL 25 MG PO CAPS: ORAL_CAPSULE | ORAL | Status: DC

## 2014-05-01 ENCOUNTER — Other Ambulatory Visit: Payer: Self-pay | Admitting: *Deleted

## 2014-05-01 MED ORDER — DOXEPIN HCL 25 MG PO CAPS: ORAL_CAPSULE | ORAL | Status: DC

## 2014-06-27 ENCOUNTER — Ambulatory Visit (INDEPENDENT_AMBULATORY_CARE_PROVIDER_SITE_OTHER): Payer: BLUE CROSS/BLUE SHIELD | Admitting: Family Medicine

## 2014-06-27 ENCOUNTER — Encounter: Payer: Self-pay | Admitting: Family Medicine

## 2014-06-27 VITALS — BP 146/99 | HR 103 | Temp 98.2°F | Ht 63.0 in | Wt 190.0 lb

## 2014-06-27 DIAGNOSIS — I10 Essential (primary) hypertension: Secondary | ICD-10-CM | POA: Diagnosis not present

## 2014-06-27 DIAGNOSIS — E669 Obesity, unspecified: Secondary | ICD-10-CM

## 2014-06-27 DIAGNOSIS — R002 Palpitations: Secondary | ICD-10-CM

## 2014-06-27 DIAGNOSIS — E785 Hyperlipidemia, unspecified: Secondary | ICD-10-CM

## 2014-06-27 MED ORDER — DOXEPIN HCL 25 MG PO CAPS: ORAL_CAPSULE | ORAL | Status: DC

## 2014-06-27 MED ORDER — LISINOPRIL 5 MG PO TABS: 5.0000 mg | ORAL_TABLET | Freq: Every day | ORAL | Status: DC

## 2014-06-27 MED ORDER — SIMVASTATIN 20 MG PO TABS: ORAL_TABLET | ORAL | Status: DC

## 2014-06-27 MED ORDER — HYDROCHLOROTHIAZIDE 25 MG PO TABS: 25.0000 mg | ORAL_TABLET | Freq: Every day | ORAL | Status: DC

## 2014-06-27 MED ORDER — FLUTICASONE PROPIONATE 50 MCG/ACT NA SUSP: 2.0000 | Freq: Every day | NASAL | Status: DC

## 2014-06-28 LAB — CMP14+EGFR
ALT: 52 IU/L — ABNORMAL HIGH (ref 0–44)
AST: 30 IU/L (ref 0–40)
Albumin/Globulin Ratio: 1.5 (ref 1.1–2.5)
Albumin: 4.8 g/dL (ref 3.5–5.5)
Alkaline Phosphatase: 72 IU/L (ref 39–117)
BUN/Creatinine Ratio: 13 (ref 9–20)
BUN: 16 mg/dL (ref 6–24)
Bilirubin Total: 0.5 mg/dL (ref 0.0–1.2)
CO2: 26 mmol/L (ref 18–29)
Calcium: 9.9 mg/dL (ref 8.7–10.2)
Chloride: 93 mmol/L — ABNORMAL LOW (ref 97–108)
Creatinine, Ser: 1.22 mg/dL (ref 0.76–1.27)
GFR calc Af Amer: 76 mL/min/{1.73_m2} (ref >59)
GFR calc non Af Amer: 66 mL/min/{1.73_m2} (ref >59)
Globulin, Total: 3.2 g/dL (ref 1.5–4.5)
Glucose: 111 mg/dL — ABNORMAL HIGH (ref 65–99)
Potassium: 3.9 mmol/L (ref 3.5–5.2)
Sodium: 135 mmol/L (ref 134–144)
Total Protein: 8 g/dL (ref 6.0–8.5)

## 2014-06-28 LAB — LIPID PANEL
Chol/HDL Ratio: 3.4 ratio units (ref 0.0–5.0)
Cholesterol, Total: 175 mg/dL (ref 100–199)
HDL: 52 mg/dL (ref >39)
LDL Calculated: 98 mg/dL (ref 0–99)
Triglycerides: 123 mg/dL (ref 0–149)
VLDL Cholesterol Cal: 25 mg/dL (ref 5–40)

## 2014-06-28 NOTE — Progress Notes (Signed)
Subjective:    Patient ID: Tyler Busing., male    DOB: 22-Oct-1955, 59 y.o.   MRN: [id number]  HPI 59 year old gentleman here to follow-up hyperlipidemia. He has no side effects related to the statin. Last check of his lipid levels were at goal.  Today his chief complaint is a possible foreign body in the thenar area of his left hand he pulled a splinter out but still there is some induration and tenderness in the area and he thinks it might be part of the splinter still left we discussed options of soaking versus exploration and he chose the latter.    Review of Systems  Constitutional: Negative.   HENT: Negative.   Eyes: Negative.   Respiratory: Negative.  Negative for shortness of breath.   Cardiovascular: Negative.  Negative for chest pain and leg swelling.  Gastrointestinal: Negative.   Genitourinary: Negative.   Musculoskeletal: Negative.   Skin: Negative.   Neurological: Negative.   Psychiatric/Behavioral: Negative.   All other systems reviewed and are negative.  Patient Active Problem List   Diagnosis Date Noted  . Fatty liver   . Low testosterone   . Vitamin D deficiency   . Obesity (BMI 30.0-34.9) 05/08/2013  . Hyperglycemia 02/06/2013  . HTN (hypertension) 12/22/2011  . Hyperlipidemia 12/22/2011  . Palpitations 12/22/2011  . Anxiety 12/22/2011   Outpatient Encounter Prescriptions as of 06/27/2014  Medication Sig  . aspirin 81 MG tablet Take 81 mg by mouth daily.  . Cholecalciferol (VITAMIN D) 2000 UNITS CAPS Take 1 capsule by mouth daily.  Marland Kitchen doxepin (SINEQUAN) 25 MG capsule TAKE 1 CAPSULE (25 MG TOTAL) BY MOUTH AT BEDTIME.  . fluticasone (FLONASE) 50 MCG/ACT nasal spray Place 2 sprays into both nostrils daily.  . hydrochlorothiazide (HYDRODIURIL) 25 MG tablet Take 1 tablet (25 mg total) by mouth daily.  Marland Kitchen lisinopril (PRINIVIL,ZESTRIL) 5 MG tablet Take 1 tablet (5 mg total) by mouth daily.  Marland Kitchen omeprazole (PRILOSEC) 40 MG capsule Take 40 mg by mouth daily.  .  simvastatin (ZOCOR) 20 MG tablet TAKE 1 TABLET (20 MG TOTAL) BY MOUTH AT BEDTIME.  . [DISCONTINUED] doxepin (SINEQUAN) 25 MG capsule TAKE 1 CAPSULE (25 MG TOTAL) BY MOUTH AT BEDTIME.  . [DISCONTINUED] fluticasone (FLONASE) 50 MCG/ACT nasal spray Place 2 sprays into both nostrils daily.  . [DISCONTINUED] hydrochlorothiazide (HYDRODIURIL) 25 MG tablet TAKE 1 TABLET (25 MG TOTAL) BY MOUTH DAILY.  . [DISCONTINUED] lisinopril (PRINIVIL,ZESTRIL) 5 MG tablet Take 1 tablet (5 mg total) by mouth daily.  . [DISCONTINUED] simvastatin (ZOCOR) 20 MG tablet TAKE 1 TABLET (20 MG TOTAL) BY MOUTH AT BEDTIME.      Objective:   Physical Exam  Constitutional: He appears well-developed.  Cardiovascular: Normal rate.   Pulmonary/Chest: Effort normal.  Skin:  Procedure note: Area of possible foreign body was anesthetized with 1% with epi, skin prepped and small stab wound was made with a #11 blade. Area was explored but no foreign body was found patient. Patient did experience slight vasovagal attack during procedure but with the wound open if he will continue to soak hopefully his skin will reject foreign body. There is no evidence of pus or infection          Assessment & Plan:  1. Essential hypertension Blood pressure controlled continue same medicine - lisinopril (PRINIVIL,ZESTRIL) 5 MG tablet; Take 1 tablet (5 mg total) by mouth daily.  Dispense: 90 tablet; Refill: 1  2. Hyperlipidemia We'll check lipids and liver functions today - Lipid  panel - CMP14+EGFR  3. Palpitations No current symptoms  4. Obesity (BMI 30.0-34.9) Weight stable no gain  Wardell Honour MD

## 2014-10-13 ENCOUNTER — Other Ambulatory Visit: Payer: Self-pay | Admitting: Family Medicine

## 2014-10-13 ENCOUNTER — Other Ambulatory Visit: Payer: Self-pay | Admitting: Nurse Practitioner

## 2014-10-16 ENCOUNTER — Other Ambulatory Visit: Payer: Self-pay | Admitting: *Deleted

## 2014-10-16 MED ORDER — DOXEPIN HCL 25 MG PO CAPS: ORAL_CAPSULE | ORAL | Status: DC

## 2014-10-17 ENCOUNTER — Encounter: Payer: Self-pay | Admitting: Gastroenterology

## 2015-01-01 ENCOUNTER — Ambulatory Visit (INDEPENDENT_AMBULATORY_CARE_PROVIDER_SITE_OTHER): Payer: BLUE CROSS/BLUE SHIELD | Admitting: Family Medicine

## 2015-01-01 ENCOUNTER — Encounter: Payer: Self-pay | Admitting: Family Medicine

## 2015-01-01 VITALS — BP 135/93 | HR 105 | Temp 97.5°F | Ht 63.0 in | Wt 195.0 lb

## 2015-01-01 DIAGNOSIS — I10 Essential (primary) hypertension: Secondary | ICD-10-CM | POA: Diagnosis not present

## 2015-01-01 NOTE — Progress Notes (Signed)
   Subjective:    Patient ID: Tyler Busing., male    DOB: 03/18/1955, 59 y.o.   MRN: [id number]  HPI 59 year old gentleman here to follow-up hyperlipidemia and hypertension. He tells me he has decided to get serious about his health problems which includes losing weight pain more attention to foods that he eats. Clearly at his last visit and today blood pressures are elevated and some mild adjustments of his medication could probably normalize that but he declines manipulation of medicines.  Patient Active Problem List   Diagnosis Date Noted  . Fatty liver   . Low testosterone   . Vitamin D deficiency   . Obesity (BMI 30.0-34.9) 05/08/2013  . Hyperglycemia 02/06/2013  . HTN (hypertension) 12/22/2011  . Hyperlipidemia 12/22/2011  . Palpitations 12/22/2011  . Anxiety 12/22/2011   Outpatient Encounter Prescriptions 59 y.o.   MRN: [id number]  Medication Sig  . aspirin 81 MG tablet Take 81 mg by mouth daily.  Marland Kitchen doxepin (SINEQUAN) 25 MG capsule TAKE 1 CAPSULE (25 MG TOTAL) BY MOUTH AT BEDTIME.  . hydrochlorothiazide (HYDRODIURIL) 25 MG tablet Take 1 tablet (25 mg total) by mouth daily.  Marland Kitchen lisinopril (PRINIVIL,ZESTRIL) 5 MG tablet Take 1 tablet (5 mg total) by mouth daily.  Marland Kitchen omeprazole (PRILOSEC) 40 MG capsule Take 40 mg by mouth daily.  . simvastatin (ZOCOR) 20 MG tablet TAKE 1 TABLET (20 MG TOTAL) BY MOUTH AT BEDTIME.  Marland Kitchen Cholecalciferol (VITAMIN D) 2000 UNITS CAPS Take 1 capsule by mouth daily.  . fluticasone (FLONASE) 50 MCG/ACT nasal spray Place 2 sprays into both nostrils daily. (Patient not taking: Reported on 01/01/2015)  . [DISCONTINUED] hydrochlorothiazide (HYDRODIURIL) 25 MG tablet TAKE 1 TABLET (25 MG TOTAL) BY MOUTH DAILY.  . [DISCONTINUED] simvastatin (ZOCOR) 20 MG tablet TAKE 1 TABLET (20 MG TOTAL) BY MOUTH AT BEDTIME.  . [DISCONTINUED] simvastatin (ZOCOR) 20 MG tablet TAKE 1 TABLET (20 MG TOTAL) BY MOUTH AT BEDTIME.  . [DISCONTINUED] simvastatin (ZOCOR) 20 MG tablet TAKE 1 TABLET  (20 MG TOTAL) BY MOUTH AT BEDTIME.   No facility-administered encounter medications on file 59 y.o.   MRN: [id number].      Review of Systems  Constitutional: Negative.   HENT: Negative.   Respiratory: Negative.   Cardiovascular: Negative.   Genitourinary: Negative.   Neurological: Negative.   Psychiatric/Behavioral: Negative.        Objective:   Physical Exam  Constitutional: He is oriented to person, place, and time. He appears well-developed and well-nourished.  Cardiovascular: Normal rate and regular rhythm.   Pulmonary/Chest: Effort normal and breath sounds normal.  Neurological: He is alert and oriented to person, place, and time.  Psychiatric: He has a normal mood and affect. Thought content normal.          Assessment & Plan:  1. Essential hypertension Blood pressure is not as well controlled. I'm willing to give patient a arbitrary 6 months. We lose weight and try to get his blood pressure normalized but I told him that I would be happy to adjust his medicine to accomplish her goals. - CMP14+EG 2. Hyperlipidemia - Lipid panel Patient would also like to lose weight and manage his lipids with diet and weight loss. Again I told him this could be possible but not red likely but will wait 6 months and repeat labs  Wardell Honour MD

## 2015-01-02 LAB — CMP14+EGFR
ALT: 49 IU/L — ABNORMAL HIGH (ref 0–44)
AST: 28 IU/L (ref 0–40)
Albumin/Globulin Ratio: 1.6 (ref 1.1–2.5)
Albumin: 4.7 g/dL (ref 3.5–5.5)
Alkaline Phosphatase: 66 IU/L (ref 39–117)
BUN/Creatinine Ratio: 16 (ref 9–20)
BUN: 17 mg/dL (ref 6–24)
Bilirubin Total: 0.4 mg/dL (ref 0.0–1.2)
CO2: 29 mmol/L (ref 18–29)
Calcium: 9.7 mg/dL (ref 8.7–10.2)
Chloride: 95 mmol/L — ABNORMAL LOW (ref 97–106)
Creatinine, Ser: 1.07 mg/dL (ref 0.76–1.27)
GFR calc Af Amer: 89 mL/min/{1.73_m2} (ref >59)
GFR calc non Af Amer: 77 mL/min/{1.73_m2} (ref >59)
Globulin, Total: 2.9 g/dL (ref 1.5–4.5)
Glucose: 92 mg/dL (ref 65–99)
Potassium: 4.1 mmol/L (ref 3.5–5.2)
Sodium: 138 mmol/L (ref 136–144)
Total Protein: 7.6 g/dL (ref 6.0–8.5)

## 2015-01-02 LAB — LIPID PANEL
Chol/HDL Ratio: 3.2 ratio units (ref 0.0–5.0)
Cholesterol, Total: 162 mg/dL (ref 100–199)
HDL: 51 mg/dL (ref >39)
LDL Calculated: 91 mg/dL (ref 0–99)
Triglycerides: 101 mg/dL (ref 0–149)
VLDL Cholesterol Cal: 20 mg/dL (ref 5–40)

## 2015-03-27 ENCOUNTER — Other Ambulatory Visit: Payer: Self-pay | Admitting: *Deleted

## 2015-03-27 DIAGNOSIS — I10 Essential (primary) hypertension: Secondary | ICD-10-CM

## 2015-03-27 MED ORDER — LISINOPRIL 5 MG PO TABS
5.0000 mg | ORAL_TABLET | Freq: Every day | ORAL | Status: DC
Start: 2015-03-27 — End: 2015-07-03

## 2015-04-19 ENCOUNTER — Other Ambulatory Visit: Payer: Self-pay | Admitting: Family Medicine

## 2015-05-21 ENCOUNTER — Other Ambulatory Visit: Payer: Self-pay | Admitting: *Deleted

## 2015-05-22 MED ORDER — DOXEPIN HCL 25 MG PO CAPS: ORAL_CAPSULE | ORAL | Status: DC

## 2015-07-03 ENCOUNTER — Encounter: Payer: Self-pay | Admitting: Family Medicine

## 2015-07-03 ENCOUNTER — Ambulatory Visit (INDEPENDENT_AMBULATORY_CARE_PROVIDER_SITE_OTHER): Payer: BLUE CROSS/BLUE SHIELD | Admitting: Family Medicine

## 2015-07-03 VITALS — BP 134/82 | HR 91 | Temp 98.7°F | Ht 63.0 in | Wt 190.6 lb

## 2015-07-03 DIAGNOSIS — I10 Essential (primary) hypertension: Secondary | ICD-10-CM

## 2015-07-03 MED ORDER — HYDROCHLOROTHIAZIDE 25 MG PO TABS: 25.0000 mg | ORAL_TABLET | Freq: Every day | ORAL | Status: DC

## 2015-07-03 MED ORDER — LISINOPRIL 5 MG PO TABS
5.0000 mg | ORAL_TABLET | Freq: Every day | ORAL | Status: DC
Start: 2015-07-03 — End: 2016-01-01

## 2015-07-03 NOTE — Progress Notes (Signed)
   Subjective:    Patient ID: Tyler Pruitt., male    DOB: [birth year], 60 y.o.   MRN: [id number]  HPI 60 year old gentleman with hypertension, hyperlipidemia,. He is on minimal medicines including 5 mg lisinopril 25 hydrochlorothiazide. He is not currently taking a statin but is willing to restart it if his numbers that we check today have increased. He is really into trying to control his symptoms with behavior modification such as losing weight watching his diet. I explained that I generally encourage that but sometimes medicines are necessary.  Patient Active Problem List   Diagnosis Date Noted  . Fatty liver   . Low testosterone   . Vitamin D deficiency   . Obesity (BMI 30.0-34.9) 05/08/2013  . Hyperglycemia 02/06/2013  . HTN (hypertension) 12/22/2011  . Hyperlipidemia 12/22/2011  . Palpitations 12/22/2011  . Anxiety 12/22/2011   Outpatient Encounter Prescriptions as of 07/03/2015  Medication Sig  . aspirin 81 MG tablet Take 81 mg by mouth daily.  . Cholecalciferol (VITAMIN D) 2000 UNITS CAPS Take 1 capsule by mouth daily.  Marland Kitchen doxepin (SINEQUAN) 25 MG capsule TAKE 1 CAPSULE (25 MG TOTAL) BY MOUTH AT BEDTIME.  . fluticasone (FLONASE) 50 MCG/ACT nasal spray Place 2 sprays into both nostrils daily.  . hydrochlorothiazide (HYDRODIURIL) 25 MG tablet Take 1 tablet (25 mg total) by mouth daily.  Marland Kitchen lisinopril (PRINIVIL,ZESTRIL) 5 MG tablet Take 1 tablet (5 mg total) by mouth daily.  Marland Kitchen omeprazole (PRILOSEC) 40 MG capsule Take 40 mg by mouth daily.  . simvastatin (ZOCOR) 20 MG tablet TAKE 1 TABLET (20 MG TOTAL) BY MOUTH AT BEDTIME.  . [DISCONTINUED] simvastatin (ZOCOR) 20 MG tablet TAKE 1 TABLET (20 MG TOTAL) BY MOUTH AT BEDTIME.  . [DISCONTINUED] hydrochlorothiazide (HYDRODIURIL) 25 MG tablet TAKE 1 TABLET (25 MG TOTAL) BY MOUTH DAILY.   No facility-administered encounter medications on file as of 07/03/2015.      Review of Systems  Constitutional: Negative.   HENT: Negative.   Eyes:  Negative.   Respiratory: Negative.  Negative for shortness of breath.   Cardiovascular: Negative.  Negative for chest pain and leg swelling.  Gastrointestinal: Negative.   Genitourinary: Negative.   Musculoskeletal: Negative.   Skin: Negative.   Neurological: Negative.   Psychiatric/Behavioral: Negative.   All other systems reviewed and are negative.      Objective:   Physical Exam  Constitutional: He is oriented to person, place, and time. He appears well-developed and well-nourished.  Cardiovascular: Normal rate and regular rhythm.   Pulmonary/Chest: Effort normal and breath sounds normal.  Neurological: He is alert and oriented to person, place, and time.  Psychiatric: He has a normal mood and affect. His behavior is normal.          Assessment & Plan:  1. Essential hypertension Continue with lisinopril and hydrochlorothiazide. Will check lipids today and using risk assessment formula decide whether or not to start him back on medicine. If we do would use atorvastatin 20 mg every other day and see how that is tolerated.  Wardell Honour MD - lisinopril (PRINIVIL,ZESTRIL) 5 MG tablet; Take 1 tablet (5 mg total) by mouth daily.  Dispense: 90 tablet; Refill: 1 - Lipid panel

## 2015-07-03 NOTE — Addendum Note (Signed)
Addended by: Jamelle Haring on: 07/03/2015 04:19 PM   Modules accepted: Miquel Dunn

## 2015-07-04 LAB — LIPID PANEL
Chol/HDL Ratio: 4.3 ratio units (ref 0.0–5.0)
Cholesterol, Total: 239 mg/dL — ABNORMAL HIGH (ref 100–199)
HDL: 55 mg/dL (ref >39)
LDL Calculated: 158 mg/dL — ABNORMAL HIGH (ref 0–99)
Triglycerides: 131 mg/dL (ref 0–149)
VLDL Cholesterol Cal: 26 mg/dL (ref 5–40)

## 2015-07-17 ENCOUNTER — Encounter: Payer: Self-pay | Admitting: *Deleted

## 2015-08-13 DIAGNOSIS — H52223 Regular astigmatism, bilateral: Secondary | ICD-10-CM | POA: Diagnosis not present

## 2015-08-13 DIAGNOSIS — H40013 Open angle with borderline findings, low risk, bilateral: Secondary | ICD-10-CM | POA: Diagnosis not present

## 2015-08-13 DIAGNOSIS — H2513 Age-related nuclear cataract, bilateral: Secondary | ICD-10-CM | POA: Diagnosis not present

## 2015-08-27 ENCOUNTER — Other Ambulatory Visit: Payer: Self-pay | Admitting: *Deleted

## 2015-08-27 MED ORDER — DOXEPIN HCL 25 MG PO CAPS: ORAL_CAPSULE | ORAL | Status: DC

## 2015-10-04 ENCOUNTER — Ambulatory Visit: Payer: BLUE CROSS/BLUE SHIELD | Admitting: Family Medicine

## 2015-10-09 ENCOUNTER — Ambulatory Visit: Payer: BLUE CROSS/BLUE SHIELD | Admitting: Family Medicine

## 2015-12-10 ENCOUNTER — Encounter: Payer: Self-pay | Admitting: *Deleted

## 2015-12-24 ENCOUNTER — Ambulatory Visit: Payer: BLUE CROSS/BLUE SHIELD | Admitting: Family Medicine

## 2015-12-27 ENCOUNTER — Ambulatory Visit: Payer: BLUE CROSS/BLUE SHIELD | Admitting: Family Medicine

## 2016-01-01 ENCOUNTER — Other Ambulatory Visit: Payer: Self-pay | Admitting: Family Medicine

## 2016-01-01 DIAGNOSIS — I10 Essential (primary) hypertension: Secondary | ICD-10-CM

## 2016-01-09 ENCOUNTER — Encounter: Payer: Self-pay | Admitting: *Deleted

## 2016-01-29 ENCOUNTER — Ambulatory Visit: Payer: BLUE CROSS/BLUE SHIELD | Admitting: Family Medicine

## 2016-02-03 ENCOUNTER — Other Ambulatory Visit: Payer: Self-pay | Admitting: Family Medicine

## 2016-02-07 ENCOUNTER — Ambulatory Visit: Payer: BLUE CROSS/BLUE SHIELD | Admitting: Family Medicine

## 2016-02-12 ENCOUNTER — Ambulatory Visit: Payer: BLUE CROSS/BLUE SHIELD | Admitting: Family Medicine

## 2016-02-25 NOTE — Progress Notes (Signed)
   Subjective:    Patient ID: Tyler Busing., male    DOB: 10-01-1955, 60 y.o.   MRN: [id number]  HPI 60 year old gentleman who is followed for cholesterol and blood pressure. He prefers to treat these problems with behavior modification but is not having very good success especially as far as weight control. He had previously demonstrated an intolerance to statins. Regarding blood pressure, he takes hydrochlorothiazide and only 5 mg of lisinopril. He also has a history of low energy and libido and has been shown before to have low testosterone level. He expresses a desire to see where that is now and consider supplement if needed.  Patient Active Problem List   Diagnosis Date Noted  . Fatty liver   . Low testosterone   . Vitamin D deficiency   . Obesity (BMI 30.0-34.9) 05/08/2013  . Hyperglycemia 02/06/2013  . HTN (hypertension) 12/22/2011  . Hyperlipidemia 12/22/2011  . Palpitations 12/22/2011  . Anxiety 12/22/2011   Outpatient Encounter Prescriptions as of 02/26/2016  Medication Sig  . aspirin 81 MG tablet Take 81 mg by mouth daily.  Marland Kitchen doxepin (SINEQUAN) 25 MG capsule TAKE 1 CAPSULE (25 MG TOTAL) BY MOUTH AT BEDTIME.  . hydrochlorothiazide (HYDRODIURIL) 25 MG tablet TAKE 1 TABLET (25 MG TOTAL) BY MOUTH DAILY.  Marland Kitchen lisinopril (PRINIVIL,ZESTRIL) 5 MG tablet TAKE 1 TABLET (5 MG TOTAL) BY MOUTH DAILY.  Marland Kitchen omeprazole (PRILOSEC) 40 MG capsule Take 40 mg by mouth daily.  . fluticasone (FLONASE) 50 MCG/ACT nasal spray Place 2 sprays into both nostrils daily. (Patient not taking: Reported on 02/26/2016)  . [DISCONTINUED] Cholecalciferol (VITAMIN D) 2000 UNITS CAPS Take 1 capsule by mouth daily.  . [DISCONTINUED] simvastatin (ZOCOR) 20 MG tablet TAKE 1 TABLET (20 MG TOTAL) BY MOUTH AT BEDTIME.   No facility-administered encounter medications on file as of 02/26/2016.       Review of Systems  Constitutional: Positive for fatigue.  Respiratory: Negative.   Cardiovascular: Negative.     Genitourinary: Negative.   Neurological: Negative.        Objective:   Physical Exam  Constitutional: He is oriented to person, place, and time. He appears well-developed and well-nourished.  Cardiovascular: Normal rate, regular rhythm and normal heart sounds.   Pulmonary/Chest: Effort normal and breath sounds normal.  Neurological: He is alert and oriented to person, place, and time.   BP (!) 141/88 (BP Location: Right Arm, Patient Position: Sitting, Cuff Size: Normal)   Pulse 83   Temp 97.9 F (36.6 C) (Oral)   Ht 5\' 3"  (1.6 m)   Wt 194 lb 8 oz (88.2 kg)   BMI 34.45 kg/m         Assessment & Plan:  1. Fatigue, unspecified type Repeat testosterone level patient is fasting - PSA, total and free - Lipid panel  2. Essential hypertension Increase lisinopril to 10 mg and follow  3. Mixed hyperlipidemia Patient is borderline with treatment. We talked about use of a different statin such as atorvastatin taken twice a week and he is interested in that sort of regimen.  Wardell Honour MD

## 2016-02-26 ENCOUNTER — Ambulatory Visit (INDEPENDENT_AMBULATORY_CARE_PROVIDER_SITE_OTHER): Payer: BLUE CROSS/BLUE SHIELD | Admitting: Family Medicine

## 2016-02-26 ENCOUNTER — Encounter: Payer: Self-pay | Admitting: Family Medicine

## 2016-02-26 VITALS — BP 141/88 | HR 83 | Temp 97.9°F | Ht 63.0 in | Wt 194.5 lb

## 2016-02-26 DIAGNOSIS — R5383 Other fatigue: Secondary | ICD-10-CM | POA: Diagnosis not present

## 2016-02-26 DIAGNOSIS — I10 Essential (primary) hypertension: Secondary | ICD-10-CM | POA: Diagnosis not present

## 2016-02-26 DIAGNOSIS — E782 Mixed hyperlipidemia: Secondary | ICD-10-CM | POA: Diagnosis not present

## 2016-02-26 MED ORDER — OMEPRAZOLE 40 MG PO CPDR: 40.0000 mg | DELAYED_RELEASE_CAPSULE | Freq: Every day | ORAL | 2 refills | Status: DC

## 2016-02-26 MED ORDER — DOXEPIN HCL 25 MG PO CAPS: ORAL_CAPSULE | ORAL | 2 refills | Status: DC

## 2016-02-26 MED ORDER — LISINOPRIL 10 MG PO TABS: 10.0000 mg | ORAL_TABLET | Freq: Every day | ORAL | 2 refills | Status: DC

## 2016-02-26 MED ORDER — HYDROCHLOROTHIAZIDE 25 MG PO TABS: 25.0000 mg | ORAL_TABLET | Freq: Every day | ORAL | 2 refills | Status: DC

## 2016-02-27 LAB — PSA, TOTAL AND FREE
PSA, Free Pct: 32 %
PSA, Free: 0.32 ng/mL
Prostate Specific Ag, Serum: 1 ng/mL (ref 0.0–4.0)

## 2016-02-27 LAB — LIPID PANEL
Chol/HDL Ratio: 4.6 ratio units (ref 0.0–5.0)
Cholesterol, Total: 217 mg/dL — ABNORMAL HIGH (ref 100–199)
HDL: 47 mg/dL (ref >39)
LDL Calculated: 148 mg/dL — ABNORMAL HIGH (ref 0–99)
Triglycerides: 109 mg/dL (ref 0–149)
VLDL Cholesterol Cal: 22 mg/dL (ref 5–40)

## 2016-03-13 ENCOUNTER — Encounter: Payer: Self-pay | Admitting: Pediatrics

## 2016-03-13 ENCOUNTER — Ambulatory Visit (INDEPENDENT_AMBULATORY_CARE_PROVIDER_SITE_OTHER): Payer: BLUE CROSS/BLUE SHIELD | Admitting: Pediatrics

## 2016-03-13 VITALS — BP 149/99 | HR 88 | Temp 98.2°F | Ht 63.0 in | Wt 199.8 lb

## 2016-03-13 DIAGNOSIS — I10 Essential (primary) hypertension: Secondary | ICD-10-CM

## 2016-03-13 DIAGNOSIS — S46211A Strain of muscle, fascia and tendon of other parts of biceps, right arm, initial encounter: Secondary | ICD-10-CM

## 2016-03-13 DIAGNOSIS — J069 Acute upper respiratory infection, unspecified: Secondary | ICD-10-CM | POA: Diagnosis not present

## 2016-03-13 LAB — BMP8+EGFR
BUN/Creatinine Ratio: 18 (ref 9–20)
BUN: 18 mg/dL (ref 6–24)
CO2: 24 mmol/L (ref 18–29)
Calcium: 9.1 mg/dL (ref 8.7–10.2)
Chloride: 99 mmol/L (ref 96–106)
Creatinine, Ser: 0.99 mg/dL (ref 0.76–1.27)
GFR calc Af Amer: 97 mL/min/{1.73_m2} (ref >59)
GFR calc non Af Amer: 84 mL/min/{1.73_m2} (ref >59)
Glucose: 111 mg/dL — ABNORMAL HIGH (ref 65–99)
Potassium: 4.3 mmol/L (ref 3.5–5.2)
Sodium: 140 mmol/L (ref 134–144)

## 2016-03-13 MED ORDER — LISINOPRIL 20 MG PO TABS: 20.0000 mg | ORAL_TABLET | Freq: Every day | ORAL | 1 refills | Status: DC

## 2016-03-13 MED ORDER — LISINOPRIL 10 MG PO TABS: 10.0000 mg | ORAL_TABLET | Freq: Every day | ORAL | Status: DC

## 2016-03-13 NOTE — Patient Instructions (Addendum)
Start with stretches below after this weekend if arm is starting to feel better.  Take flonase and cetirizine daily while you have congestion, drainage, sore throat.  Biceps Tendon Disruption (Proximal) Rehab Ask your health care provider which exercises are safe for you. Do exercises exactly as told by your health care provider and adjust them as directed. It is normal to feel mild stretching, pulling, tightness, or discomfort as you do these exercises, but you should stop right away if you feel sudden pain or your pain gets worse.Do not begin these exercises until told by your health care provider. Stretching and range of motion exercises These exercises warm up your muscles and joints and improve the movement and flexibility of your arm and shoulder. These exercises also help to relieve pain and stiffness. Exercise A: Shoulder flexion, standing 1. Stand facing a wall. Put your left / right hand on the wall. 2. Slide your left / right hand up the wall. Stop when you feel a stretch in your shoulder, or when you reach the angle recommended by your health care provider.  Use your other hand to help raise your arm, if needed.  As your hand gets higher, you may need to step closer to the wall.  Avoid shrugging your shoulder while you raise your arm. To do this, keep your shoulder blade tucked down toward your spine. 3. Hold for __________ seconds. 4. Slowly return to the starting position. Use your other arm to help, if needed. Repeat __________ times. Complete this exercise __________ times a day. Exercise B: Pendulum 1. Stand near a wall or a surface that you can hold onto for balance. 2. Bend at the waist and let your left / right arm hang straight down. Use your other arm to support you. 3. Relax your arm and shoulder muscles, and move your hips and your trunk so your left / right arm swings freely. Your arm should swing because of the motion of your body, not because you are using your arm  or shoulder muscles. 4. Keep moving so your arm swings in the following directions, as told by your health care provider:  Side to side.  Forward and backward.  In clockwise and counterclockwise circles. 5. Slowly return to the starting position. Repeat __________ times. Complete this exercise __________ times a day. Strengthening exercises These exercises build strength and endurance in your arm and shoulder. Endurance is the ability to use your muscles for a long time, even after your muscles get tired. Exercise C: Elbow flexion, neutral 1. Sit on a stable chair without armrests, or stand. 2. Hold a __________ weight in your left / right hand, or hold an exercise band with both hands. Your palms should face each other at the starting position. 3. Bend your left / right elbow and move your hand up toward your shoulder.  Lead with your thumb, and keep your palm facing the same direction.  Keep your other arm straight down, in the starting position. 4. Slowly return to the starting position. Repeat __________ times. Complete this exercise __________ times a day. Exercise D: Forearm supination 1. Sit with your left / right forearm on a table. Your elbow should be below shoulder height. Rest your hand over the edge of the table so your palm faces down. 2. If directed, hold a hammer with your left / right hand. 3. Without moving your elbow, slowly rotate your hand so your palm faces up toward the ceiling.  If you are holding a  hammer, begin by holding the hammer near the head. When this exercise gets easier for you, hold the hammer farther down the handle. 4. Hold for __________ seconds. 5. Slowly return to the starting position. Repeat __________ times. Complete this exercise __________ times a day. Exercise E: Scapular retraction 1. Sit in a stable chair without armrests, or stand. 2. Secure an exercise band to a stable object in front of you so the band is at shoulder height. 3. Hold  one end of the exercise band in each hand. 4. Squeeze your shoulder blades together and move your elbows slightly behind you. Do not shrug your shoulders. 5. Hold for __________ seconds. 6. Slowly return to the starting position. Repeat __________ times. Complete this exercise __________ times a day. Exercise F: Scapular protraction, supine 1. Lie on your back on a firm surface. Hold a __________ weight in your left / right hand. 2. Raise your left / right arm straight into the air so your hand is directly above your shoulder joint. 3. Push the weight into the air so your shoulder lifts off of the surface that you are lying on. Do not move your head, neck, or back. 4. Hold for __________ seconds. 5. Slowly return to the starting position. Let your muscles relax completely before you repeat this exercise. Repeat __________ times. Complete this exercise __________ times a day. This information is not intended to replace advice given to you by your health care provider. Make sure you discuss any questions you have with your health care provider. Document Released: 03/02/2005 Document Revised: 11/07/2015 Document Reviewed: 02/08/2015 Elsevier Interactive Patient Education  2017 [employer]  Biceps Tendon Disruption (Proximal) The proximal biceps tendon is a strong cord of tissue that connects the biceps muscle, on the front of the upper arm, to the shoulder blade. A proximal biceps tendon disruption can include a partial or complete tear of the tendon near where it connects to the bone near the shoulder. A proximal biceps tendon disruption can interfere with the ability to lift the arm in front of the body, stabilize the shoulder, bend the elbow, and turn the hand palm-up (supination). What are the causes? A biceps tendon disruption happens when the tendon is exposed to too much force. This excess force may be caused by:  The elbow being suddenly straightened from a bent position because of an  external force. This could happen, for example, while catching a heavy weight or being pulled when waterskiing.  Wear and tear from physical activity.  Breaking a fall with your hand. What increases the risk? The following factors may make you more likely to develop this condition:  Playing contact sports.  Doing activities or sports that involve throwing or overhead movements, such as racket sports, gymnastics, or baseball.  Doing activities or sports that involve putting sudden force on the arm, such as weightlifting or waterskiing.  Having a weakened tendon. The tendon may be weak because of:  Long-lasting (chronic) biceps tendinitis.  Certain medical conditions, such as diabetes or rheumatoid arthritis.  Repeated corticosteroid use.  Repetitive overhead movements. What are the signs or symptoms? Symptoms of this condition may include:  Sudden sharp pain in the front of the shoulder. Pain may get worse during certain movements, such as:  Lifting or carrying objects.  Straightening the elbow.  Throwing or using overhead movements.  Inflammation or a feeling of unusual warmth on the front of the shoulder.  Painful tightening (spasm) of the biceps muscle.  A bulge  on the inside of the upper arm when the elbow is bent.  Bruising in the shoulder or upper arm. This may develop 24-48 hours after the tendon is injured.  Limited range of motion of the shoulder and elbow.  Weakness in the elbow and forearm when:  Bending the elbow.  Rotating the wrist. How is this diagnosed? This condition is diagnosed based on your symptoms, your medical history, and a physical exam. Your health care provider may test the strength and range of motion of your shoulder and elbow. You may have imaging tests, such as X-rays, MRI, or ultrasound. How is this treated? This condition is treated by resting and icing the injured area, and by doing physical therapy exercises. Depending on the  severity of your condition, treatment may also include:  Medicines to help relieve pain and inflammation.  Avoiding certain activities that put stress on your shoulder.  One or more injections of medicines (corticosteroids) into your upper arm to help reduce inflammation (rare).  Surgery to repair the tear. This may be needed if nonsurgical treatments do not improve your condition. Follow these instructions at home: Managing pain, stiffness, and swelling  If directed, put ice on the injured area:  Put ice in a plastic bag.  Place a towel between your skin and the bag.  Leave the ice on for 20 minutes, 2-3 times a day.  Move your fingers often to avoid stiffness and to lessen swelling.  Raise (elevate) the injured area while you are sitting or lying down. Activity  Return to your normal activities as told by your health care provider. Ask your health care provider what activities are safe for you.  Avoid activities that cause pain or make your condition worse.  Do not lift anything that is heavier than 10 lb (4.5 kg) until your health care provider approves.  Do exercises as told by your health care provider. General instructions  Take over-the-counter and prescription medicines only as told by your health care provider.  Do not drive or operate heavy machinery while taking prescription pain medicines.  Keep all follow-up visits as told by your health care provider. This is important. How is this prevented?  Warm up and stretch before being active.  Cool down and stretch after being active.  Give your body time to rest between periods of activity.  Make sure to use equipment that fits you.  Be safe and responsible while being active to avoid falls.  Maintain physical fitness, including strength and flexibility. Contact a health care provider if:  You have symptoms that get worse or do not get better after 2 weeks of treatment.  You develop new symptoms. Get  help right away if:  You have severe pain.  You develop pain or numbness in your hand.  Your hand feels unusually cold.  Your fingernails turn a dark color, such as blue or gray. This information is not intended to replace advice given to you by your health care provider. Make sure you discuss any questions you have with your health care provider. Document Released: 03/02/2005 Document Revised: 11/07/2015 Document Reviewed: 02/08/2015 Elsevier Interactive Patient Education  2017 [employer].

## 2016-03-13 NOTE — Progress Notes (Signed)
  Subjective:   Patient ID: Tyler Busing., male    DOB: 1955-04-15, 60 y.o.   MRN: [id number] CC: Arm Pain (right, 4 days ago picked up heavy buckey); Nasal Congestion; and Sore Throat  HPI: Tyler Gavitt. is a 60 y.o. male presenting for Arm Pain (right, 4 days ago picked up heavy buckey); Nasal Congestion; and Sore Throat  R arm, 3 days ago was cleaning up outside, getting dirt and rocks up, reaching behind him and felt something in front of arm snap Has been taking advil '600mg'$  three times day Pain has improved since initial incident Using heat and ice on it, not sure if helping  Also three days ago started having raw sore throat, he was outside a lot more then  Relevant past medical, surgical, family and social history reviewed. Allergies and medications reviewed and updated. History  Smoking Status  . Former Smoker  . Types: Cigarettes  . Quit date: 12/22/1991  Smokeless Tobacco  . Never Used    Comment: Smoked few cigarettes in the past   ROS: Per HPI   Objective:    BP (!) 149/99   Pulse 88   Temp 98.2 F (36.8 C) (Oral)   Ht '5\' 3"'$  (1.6 m)   Wt 199 lb 12.8 oz (90.6 kg)   BMI 35.39 kg/m   Wt Readings from Last 3 Encounters:  03/13/16 199 lb 12.8 oz (90.6 kg)  02/26/16 194 lb 8 oz (88.2 kg)  07/03/15 190 lb 9.6 oz (86.5 kg)    Gen: NAD, alert, cooperative with exam, NCAT EYES: EOMI, no conjunctival injection, or no icterus ENT:  R Tm slightly injected, nl LR, L TM pearly gray, OP without erythema LYMPH: no cervical LAD CV: NRRR, normal S1/S2, no murmur, distal pulses 2+ b/l Resp: CTABL, no wheezes, normal WOB Ext: No edema, warm Neuro: Alert and oriented, strength equal b/l UE and LE, coordination grossly normal MSK: slight bulge mid ant upper R arm compared with L, no bruising, no induration or redness, no TTP around rotator cuff muscles, AC joint, mild tenderness soft tissue mid ant upper arm, no TTP around elbow, + pain with supination R arm, hand grip  equal b/l, full ext/abd at shoulder b/l  Assessment & Plan:  Tyler Pruitt was seen today for arm pain, nasal congestion and sore throat.  Diagnoses and all orders for this visit:  Acute URI Discussed symptomatic care  Rupture of proximal biceps tendon, right, initial encounter NSAIDs, rest, gentle stretches If not improving let us know  Essential hypertension Elevated today Just increased to '10mg'$  yesterday Cont '10mg'$ , check at home, let us know if remains elevated Last BMP over a year ago -     BMP8+EGFR   Follow up plan: 4 weeks or as needed Assunta Found, MD [place]

## 2016-03-28 ENCOUNTER — Other Ambulatory Visit: Payer: Self-pay | Admitting: Family Medicine

## 2016-03-28 DIAGNOSIS — I10 Essential (primary) hypertension: Secondary | ICD-10-CM

## 2016-04-17 ENCOUNTER — Ambulatory Visit: Payer: BLUE CROSS/BLUE SHIELD | Admitting: Family Medicine

## 2016-06-05 ENCOUNTER — Other Ambulatory Visit: Payer: Self-pay | Admitting: Family Medicine

## 2016-06-10 ENCOUNTER — Encounter: Payer: Self-pay | Admitting: Gastroenterology

## 2016-07-06 ENCOUNTER — Encounter: Payer: Self-pay | Admitting: Gastroenterology

## 2016-07-08 ENCOUNTER — Other Ambulatory Visit: Payer: Self-pay | Admitting: Family Medicine

## 2016-07-10 ENCOUNTER — Other Ambulatory Visit: Payer: Self-pay | Admitting: *Deleted

## 2016-07-10 MED ORDER — HYDROCHLOROTHIAZIDE 25 MG PO TABS: 25.0000 mg | ORAL_TABLET | Freq: Every day | ORAL | 0 refills | Status: DC

## 2016-07-28 ENCOUNTER — Other Ambulatory Visit: Payer: Self-pay

## 2016-07-28 DIAGNOSIS — I10 Essential (primary) hypertension: Secondary | ICD-10-CM

## 2016-07-28 MED ORDER — DOXEPIN HCL 25 MG PO CAPS: 25.0000 mg | ORAL_CAPSULE | Freq: Every day | ORAL | 0 refills | Status: DC

## 2016-07-28 MED ORDER — LISINOPRIL 10 MG PO TABS: 10.0000 mg | ORAL_TABLET | Freq: Every day | ORAL | 0 refills | Status: DC

## 2016-07-29 NOTE — Telephone Encounter (Signed)
Refills were sent in, due for f/u appt, elevated BP last visit

## 2016-08-27 ENCOUNTER — Ambulatory Visit (AMBULATORY_SURGERY_CENTER): Payer: Self-pay

## 2016-08-27 ENCOUNTER — Encounter: Payer: Self-pay | Admitting: Gastroenterology

## 2016-08-27 VITALS — Ht 62.5 in | Wt 200.6 lb

## 2016-08-27 DIAGNOSIS — Z860100 Personal history of colon polyps, unspecified: Secondary | ICD-10-CM

## 2016-08-27 MED ORDER — SUPREP BOWEL PREP KIT 17.5-3.13-1.6 GM/177ML PO SOLN: 1.0000 | Freq: Once | ORAL | 0 refills | Status: AC

## 2016-08-27 NOTE — Progress Notes (Signed)
No diet meds No home oxygen No past problems with anesthesia No allergies to eggs or soy  Registered emmi 

## 2016-09-10 ENCOUNTER — Encounter: Payer: Self-pay | Admitting: Gastroenterology

## 2016-09-10 ENCOUNTER — Ambulatory Visit (AMBULATORY_SURGERY_CENTER): Payer: BLUE CROSS/BLUE SHIELD | Admitting: Gastroenterology

## 2016-09-10 VITALS — BP 134/80 | HR 93 | Temp 98.9°F | Resp 23 | Ht 62.5 in | Wt 200.0 lb

## 2016-09-10 DIAGNOSIS — Z86010 Personal history of colonic polyps: Secondary | ICD-10-CM

## 2016-09-10 DIAGNOSIS — D125 Benign neoplasm of sigmoid colon: Secondary | ICD-10-CM

## 2016-09-10 DIAGNOSIS — Z1211 Encounter for screening for malignant neoplasm of colon: Secondary | ICD-10-CM | POA: Diagnosis not present

## 2016-09-10 DIAGNOSIS — K635 Polyp of colon: Secondary | ICD-10-CM

## 2016-09-10 DIAGNOSIS — K621 Rectal polyp: Secondary | ICD-10-CM

## 2016-09-10 DIAGNOSIS — D128 Benign neoplasm of rectum: Secondary | ICD-10-CM

## 2016-09-10 MED ORDER — SODIUM CHLORIDE 0.9 % IV SOLN: 500.0000 mL | INTRAVENOUS | Status: DC

## 2016-09-10 NOTE — Op Note (Signed)
[facility] Patient Name: Tyler Pruitt Procedure Date: 09/10/2016 11:01 AM MRN: [id number] Endoscopist: Ladene Artist , MD Age: 61 Referring MD:  Date of Birth: September 20, 1955 Gender: Male Account #: [id number] Procedure:                Colonoscopy Indications:              Surveillance: Personal history of adenomatous                            polyps on last colonoscopy 5 years ago Medicines:                Monitored Anesthesia Care Procedure:                Pre-Anesthesia Assessment:                           - Prior to the procedure, a History and Physical                            was performed, and patient medications and                            allergies were reviewed. The patient's tolerance of                            previous anesthesia was also reviewed. The risks                            and benefits of the procedure and the sedation                            options and risks were discussed with the patient.                            All questions were answered, and informed consent                            was obtained. Prior Anticoagulants: The patient has                            taken no previous anticoagulant or antiplatelet                            agents. ASA Grade Assessment: II - A patient with                            mild systemic disease. After reviewing the risks                            and benefits, the patient was deemed in                            satisfactory condition to undergo the procedure.  After obtaining informed consent, the colonoscope                            was passed under direct vision. Throughout the                            procedure, the patient's blood pressure, pulse, and                            oxygen saturations were monitored continuously. The                            Colonoscope was introduced through the anus and                            advanced to the the cecum,  identified by                            appendiceal orifice and ileocecal valve. The                            ileocecal valve, appendiceal orifice, and rectum                            were photographed. The quality of the bowel                            preparation was excellent. The colonoscopy was                            performed without difficulty. The patient tolerated                            the procedure well. Scope In: 11:08:16 AM Scope Out: 11:23:43 AM Scope Withdrawal Time: 0 hours 12 minutes 52 seconds  Total Procedure Duration: 0 hours 15 minutes 27 seconds  Findings:                 The perianal and digital rectal examinations were                            normal.                           Three sessile polyps were found in the rectum (1)                            and sigmoid colon (2). The polyps were 4 to 5 mm in                            size. These polyps were removed with a cold snare.                            Resection and retrieval were complete.  A few small-mouthed diverticula were found in the                            sigmoid colon. There was no evidence of                            diverticular bleeding.                           The exam was otherwise without abnormality on                            direct and retroflexion views. Complications:            No immediate complications. Estimated blood loss:                            None. Estimated Blood Loss:     Estimated blood loss: none. Impression:               - Three 4 to 5 mm polyps in the rectum and in the                            sigmoid colon, removed with a cold snare. Resected                            and retrieved.                           - Mild diverticulosis in the sigmoid colon. There                            was no evidence of diverticular bleeding.                           - The examination was otherwise normal on direct                             and retroflexion views. Recommendation:           - Repeat colonoscopy in 5 years for surveillance.                           - Patient has a contact number available for                            emergencies. The signs and symptoms of potential                            delayed complications were discussed with the                            patient. Return to normal activities tomorrow.                            Written discharge instructions were  provided to the                            patient.                           - Resume previous diet.                           - Continue present medications.                           - Await pathology results. Ladene Artist, MD 09/10/2016 11:26:56 AM This report has been signed electronically.

## 2016-09-10 NOTE — Progress Notes (Signed)
Called to room to assist during endoscopic procedure.  Patient ID and intended procedure confirmed with present staff. Received instructions for my participation in the procedure from the performing physician.  

## 2016-09-10 NOTE — Progress Notes (Signed)
Report to PACU, RN, vss, BBS= Clear.  

## 2016-09-10 NOTE — Patient Instructions (Signed)
YOU HAD AN ENDOSCOPIC PROCEDURE TODAY AT THE [facility]:   Refer to the procedure report that was given to you for any specific questions about what was found during the examination.  If the procedure report does not answer your questions, please call your gastroenterologist to clarify.  If you requested that your care partner not be given the details of your procedure findings, then the procedure report has been included in a sealed envelope for you to review at your convenience later.  YOU SHOULD EXPECT: Some feelings of bloating in the abdomen. Passage of more gas than usual.  Walking can help get rid of the air that was put into your GI tract during the procedure and reduce the bloating. If you had a lower endoscopy (such as a colonoscopy or flexible sigmoidoscopy) you may notice spotting of blood in your stool or on the toilet paper. If you underwent a bowel prep for your procedure, you may not have a normal bowel movement for a few days.  Please Note:  You might notice some irritation and congestion in your nose or some drainage.  This is from the oxygen used during your procedure.  There is no need for concern and it should clear up in a day or so.  SYMPTOMS TO REPORT IMMEDIATELY:   Following lower endoscopy (colonoscopy or flexible sigmoidoscopy):  Excessive amounts of blood in the stool  Significant tenderness or worsening of abdominal pains  Swelling of the abdomen that is new, acute  Fever of 100F or higher   For urgent or emergent issues, a gastroenterologist can be reached at any hour by calling [phone]. Please read all handouts given to you by your recovery nurse today.  DIET:  We do recommend a small meal at first, but then you may proceed to your regular diet.  Drink plenty of fluids but you should avoid alcoholic beverages for 24 hours.  ACTIVITY:  You should plan to take it easy for the rest of today and you should NOT DRIVE or use heavy machinery until  tomorrow (because of the sedation medicines used during the test).    FOLLOW UP: Our staff will call the number listed on your records the next business day following your procedure to check on you and address any questions or concerns that you may have regarding the information given to you following your procedure. If we do not reach you, we will leave a message.  However, if you are feeling well and you are not experiencing any problems, there is no need to return our call.  We will assume that you have returned to your regular daily activities without incident.  If any biopsies were taken you will be contacted by phone or by letter within the next 1-3 weeks.  Please call us at [phone] if you have not heard about the biopsies in 3 weeks.    SIGNATURES/CONFIDENTIALITY: You and/or your care partner have signed paperwork which will be entered into your electronic medical record.  These signatures attest to the fact that that the information above on your After Visit Summary has been reviewed and is understood.  Full responsibility of the confidentiality of this discharge information lies with you and/or your care-partner.  Thank you for letting us take care of your healthcare needs today. 

## 2016-09-10 NOTE — Progress Notes (Signed)
Pt's states no medical or surgical changes since previsit or office visit. 

## 2016-09-11 ENCOUNTER — Telehealth: Payer: Self-pay

## 2016-09-11 NOTE — Telephone Encounter (Signed)
  Follow up Call-  Call back number 09/10/2016  Post procedure Call Back phone  # [phone]  Permission to leave phone message Yes  Some recent data might be hidden     Patient questions:  Do you have a fever, pain , or abdominal swelling? No. Pain Score  0 *  Have you tolerated food without any problems? Yes.    Have you been able to return to your normal activities? Yes.    Do you have any questions about your discharge instructions: Diet   No. Medications  No. Follow up visit  No.  Do you have questions or concerns about your Care? No.  Actions: * If pain score is 4 or above: No action needed, pain <4.

## 2016-09-20 ENCOUNTER — Encounter: Payer: Self-pay | Admitting: Gastroenterology

## 2016-11-13 ENCOUNTER — Other Ambulatory Visit: Payer: Self-pay | Admitting: Family Medicine

## 2016-11-13 NOTE — Telephone Encounter (Signed)
LAST SEEN 03/13/17 Dr Evette Doffing

## 2016-12-04 ENCOUNTER — Other Ambulatory Visit: Payer: Self-pay | Admitting: Family Medicine

## 2016-12-04 DIAGNOSIS — I10 Essential (primary) hypertension: Secondary | ICD-10-CM

## 2016-12-04 NOTE — Telephone Encounter (Signed)
Last seen 03/13/17  Dr Sabra Heck

## 2016-12-04 NOTE — Telephone Encounter (Signed)
Aware, need to schedule visit for lab work and assessment.

## 2016-12-04 NOTE — Telephone Encounter (Signed)
Patient NTBS for follow up and lab work  

## 2016-12-18 ENCOUNTER — Ambulatory Visit (INDEPENDENT_AMBULATORY_CARE_PROVIDER_SITE_OTHER): Payer: BLUE CROSS/BLUE SHIELD | Admitting: Family Medicine

## 2016-12-18 ENCOUNTER — Encounter: Payer: Self-pay | Admitting: Family Medicine

## 2016-12-18 ENCOUNTER — Ambulatory Visit: Payer: BLUE CROSS/BLUE SHIELD | Admitting: Family Medicine

## 2016-12-18 VITALS — BP 138/88 | HR 97 | Temp 98.2°F | Ht 62.5 in | Wt 200.0 lb

## 2016-12-18 DIAGNOSIS — M545 Low back pain, unspecified: Secondary | ICD-10-CM

## 2016-12-18 MED ORDER — DICLOFENAC SODIUM 75 MG PO TBEC: 75.0000 mg | DELAYED_RELEASE_TABLET | Freq: Two times a day (BID) | ORAL | 3 refills | Status: DC

## 2016-12-18 MED ORDER — CYCLOBENZAPRINE HCL 10 MG PO TABS: 10.0000 mg | ORAL_TABLET | Freq: Three times a day (TID) | ORAL | 0 refills | Status: DC | PRN

## 2016-12-18 MED ORDER — KETOROLAC TROMETHAMINE 60 MG/2ML IM SOLN
60.0000 mg | Freq: Once | INTRAMUSCULAR | Status: AC
  Administered 2016-12-18: 60 mg via INTRAMUSCULAR

## 2016-12-18 MED ORDER — METHYLPREDNISOLONE ACETATE 80 MG/ML IJ SUSP
80.0000 mg | Freq: Once | INTRAMUSCULAR | Status: AC
  Administered 2016-12-18: 80 mg via INTRAMUSCULAR

## 2016-12-18 NOTE — Patient Instructions (Signed)

## 2016-12-18 NOTE — Progress Notes (Signed)
Subjective:  Patient ID: Tyler Pruitt., male    DOB: 06/19/1955  Age: 61 y.o. MRN: [id number]  CC: Back Pain (pt here today c/o lower back pain especially on the left side )   HPI Tyler Pruitt. presents for increasing pain at left lower back radiating to hip, but not to sciatic notch. Gradual increase over 2-3 weeks. Has been working 65 hours a week. Pain increasing due to being hunched over at work bench. Also runus a forklift, lifts heavy objects too. Denies NVD. No FCS  Depression screen [facility] 2/9 12/18/2016 03/13/2016 02/26/2016  Decreased Interest 0 0 0  Down, Depressed, Hopeless 0 0 0  PHQ - 2 Score 0 0 0    History Tyler Pruitt has a past medical history of Anxiety; Fatty liver; GAD (generalized anxiety disorder); GERD (gastroesophageal reflux disease); Hyperlipidemia; Hypertension; IBS (irritable bowel syndrome); Low testosterone; Palpitations; Palpitations; and Vitamin D deficiency.   He has a past surgical history that includes None.   His family history includes Heart disease (age of onset: 16) in his father; Rheum arthritis in his mother.He reports that he quit smoking about 25 years ago. His smoking use included Cigarettes. He has never used smokeless tobacco. He reports that he drinks about 1.2 - 1.8 oz of alcohol per week . He reports that he does not use drugs.    ROS Review of Systems  Constitutional: Negative for chills, diaphoresis and fever.  HENT: Negative for sore throat.   Cardiovascular: Negative for chest pain.  Gastrointestinal: Negative for abdominal pain.  Musculoskeletal: Positive for arthralgias, back pain, gait problem and myalgias. Negative for neck pain.  Skin: Negative for rash.  Neurological: Positive for weakness. Negative for numbness.    Objective:  BP 138/88   Pulse 97   Temp 98.2 F (36.8 C) (Oral)   Ht 5' 2.5" (1.588 m)   Wt 200 lb (90.7 kg)   BMI 36.00 kg/m   BP Readings from Last 3 Encounters:  12/18/16 138/88  09/10/16 134/80   03/13/16 (!) 149/99    Wt Readings from Last 3 Encounters:  12/18/16 200 lb (90.7 kg)  09/10/16 200 lb (90.7 kg)  08/27/16 200 lb 9.6 oz (91 kg)     Physical Exam  Constitutional: He is oriented to person, place, and time. He appears well-developed and well-nourished. He appears distressed.  HENT:  Head: Normocephalic.  Eyes: Pupils are equal, round, and reactive to light.  Neck: Normal range of motion.  Cardiovascular: Normal rate, regular rhythm and normal heart sounds.   No murmur heard. Pulmonary/Chest: Effort normal and breath sounds normal.  Abdominal: There is no tenderness.  Musculoskeletal: He exhibits tenderness (left lumbar paraspinal musculature).  Neurological: He is alert and oriented to person, place, and time. He has normal reflexes.  Skin: Skin is warm and dry.  Psychiatric: His behavior is normal. Thought content normal.  Vitals reviewed.     Assessment & Plan:   Tyler Pruitt was seen today for back pain.  Diagnoses and all orders for this visit:  Lumbar back pain -     methylPREDNISolone acetate (DEPO-MEDROL) injection 80 mg; Inject 1 mL (80 mg total) into the muscle once. -     ketorolac (TORADOL) injection 60 mg; Inject 2 mLs (60 mg total) into the muscle once. -     Ambulatory referral to Physical Therapy  Other orders -     diclofenac (VOLTAREN) 75 MG EC tablet; Take 1 tablet (75 mg total) by mouth  2 (two) times daily. -     cyclobenzaprine (FLEXERIL) 10 MG tablet; Take 1 tablet (10 mg total) by mouth 3 (three) times daily as needed for muscle spasms.       I am having Tyler Pruitt start on diclofenac and cyclobenzaprine. I am also having him maintain his aspirin, fluticasone, omeprazole, lisinopril, hydrochlorothiazide, and doxepin. We administered methylPREDNISolone acetate and ketorolac. We will continue to administer sodium chloride.  Allergies as of 12/18/2016      Reactions   Pravastatin    Hurting "badly"      Medication List         Accurate as of 12/18/16  8:01 PM. Always use your most recent med list.          aspirin 81 MG tablet Take 81 mg by mouth daily.   cyclobenzaprine 10 MG tablet Commonly known as:  FLEXERIL Take 1 tablet (10 mg total) by mouth 3 (three) times daily as needed for muscle spasms.   diclofenac 75 MG EC tablet Commonly known as:  VOLTAREN Take 1 tablet (75 mg total) by mouth 2 (two) times daily.   doxepin 25 MG capsule Commonly known as:  SINEQUAN TAKE 1 CAPSULE (25 MG TOTAL) BY MOUTH AT BEDTIME.   fluticasone 50 MCG/ACT nasal spray Commonly known as:  FLONASE Place 2 sprays into both nostrils daily.   hydrochlorothiazide 25 MG tablet Commonly known as:  HYDRODIURIL Take 1 tablet (25 mg total) by mouth daily.   lisinopril 10 MG tablet Commonly known as:  PRINIVIL,ZESTRIL Take 1 tablet (10 mg total) by mouth daily.   omeprazole 40 MG capsule Commonly known as:  PRILOSEC Take 1 capsule (40 mg total) by mouth daily.        Follow-up: Return in about 1 week (around 12/25/2016).  Tyler Pruitt, M.D.

## 2016-12-25 ENCOUNTER — Encounter: Payer: Self-pay | Admitting: Family Medicine

## 2016-12-25 ENCOUNTER — Ambulatory Visit (INDEPENDENT_AMBULATORY_CARE_PROVIDER_SITE_OTHER): Payer: BLUE CROSS/BLUE SHIELD | Admitting: Family Medicine

## 2016-12-25 VITALS — BP 129/84 | HR 107 | Temp 98.0°F | Ht 62.5 in | Wt 198.0 lb

## 2016-12-25 DIAGNOSIS — Z23 Encounter for immunization: Secondary | ICD-10-CM

## 2016-12-25 DIAGNOSIS — E559 Vitamin D deficiency, unspecified: Secondary | ICD-10-CM | POA: Diagnosis not present

## 2016-12-25 DIAGNOSIS — Z125 Encounter for screening for malignant neoplasm of prostate: Secondary | ICD-10-CM

## 2016-12-25 DIAGNOSIS — R7989 Other specified abnormal findings of blood chemistry: Secondary | ICD-10-CM

## 2016-12-25 DIAGNOSIS — I10 Essential (primary) hypertension: Secondary | ICD-10-CM | POA: Diagnosis not present

## 2016-12-25 DIAGNOSIS — E782 Mixed hyperlipidemia: Secondary | ICD-10-CM

## 2016-12-25 MED ORDER — OMEPRAZOLE 40 MG PO CPDR: 40.0000 mg | DELAYED_RELEASE_CAPSULE | Freq: Every day | ORAL | 1 refills | Status: DC

## 2016-12-25 MED ORDER — HYDROCHLOROTHIAZIDE 25 MG PO TABS: 25.0000 mg | ORAL_TABLET | Freq: Every day | ORAL | 1 refills | Status: DC

## 2016-12-25 MED ORDER — TRAMADOL HCL 50 MG PO TABS: 50.0000 mg | ORAL_TABLET | Freq: Four times a day (QID) | ORAL | 0 refills | Status: DC | PRN

## 2016-12-25 MED ORDER — LISINOPRIL 10 MG PO TABS: 10.0000 mg | ORAL_TABLET | Freq: Every day | ORAL | 1 refills | Status: DC

## 2016-12-25 MED ORDER — DOXEPIN HCL 25 MG PO CAPS
25.0000 mg | ORAL_CAPSULE | Freq: Every day | ORAL | 1 refills | Status: DC
Start: 2016-12-25 — End: 2017-06-21

## 2016-12-25 NOTE — Progress Notes (Signed)
Subjective:  Patient ID: Tyler Pruitt., male    DOB: 1955-09-12  Age: 61 y.o. MRN: [id number]  CC: Hypertension (pt here today for routine follow up of his chronic medical conditions and follow up on his lower back pain which is a little better but still hurting.)   HPI Tyler Pruitt. presents for  follow-up of hypertension. Patient has no history of headache chest pain or shortness of breath or recent cough. Patient also denies symptoms of TIA such as focal numbness or weakness. Patient denies side effects from medication. States taking it regularly.  Some improvement - about 33% better, but not in P.T. Yet. Pain is as before, but les intense. Not radiating (See previous office note.   History Tyler Pruitt has a past medical history of Anxiety; Fatty liver; GAD (generalized anxiety disorder); GERD (gastroesophageal reflux disease); Hyperlipidemia; Hypertension; IBS (irritable bowel syndrome); Low testosterone; Palpitations; Palpitations; and Vitamin D deficiency.   He has a past surgical history that includes None.   His family history includes Heart disease (age of onset: 10) in his father; Rheum arthritis in his mother.He reports that he quit smoking about 25 years ago. His smoking use included Cigarettes. He has never used smokeless tobacco. He reports that he drinks about 1.2 - 1.8 oz of alcohol per week . He reports that he does not use drugs.  Current Outpatient Prescriptions on File Prior to Visit  Medication Sig Dispense Refill  . aspirin 81 MG tablet Take 81 mg by mouth daily.    . cyclobenzaprine (FLEXERIL) 10 MG tablet Take 1 tablet (10 mg total) by mouth 3 (three) times daily as needed for muscle spasms. 90 tablet 0  . diclofenac (VOLTAREN) 75 MG EC tablet Take 1 tablet (75 mg total) by mouth 2 (two) times daily. 60 tablet 3  . fluticasone (FLONASE) 50 MCG/ACT nasal spray Place 2 sprays into both nostrils daily. 16 g 5   Current Facility-Administered Medications on File  Prior to Visit  Medication Dose Route Frequency Provider Last Rate Last Dose  . 0.9 %  sodium chloride infusion  500 mL Intravenous Continuous Tyler Artist, MD        ROS Review of Systems  Constitutional: Negative for chills, diaphoresis, fever and unexpected weight change.  HENT: Negative for congestion, hearing loss, rhinorrhea and sore throat.   Eyes: Negative for visual disturbance.  Respiratory: Negative for cough and shortness of breath.   Cardiovascular: Negative for chest pain.  Gastrointestinal: Negative for abdominal pain, constipation and diarrhea.  Genitourinary: Negative for dysuria and flank pain.  Musculoskeletal: Positive for back pain and myalgias. Negative for arthralgias and joint swelling.  Skin: Negative for rash.  Neurological: Negative for dizziness and headaches.  Psychiatric/Behavioral: Negative for dysphoric mood and sleep disturbance.    Objective:  BP 129/84   Pulse (!) 107   Temp 98 F (36.7 C) (Oral)   Ht 5' 2.5" (1.588 m)   Wt 198 lb (89.8 kg)   BMI 35.64 kg/m   BP Readings from Last 3 Encounters:  12/25/16 129/84  12/18/16 138/88  09/10/16 134/80    Wt Readings from Last 3 Encounters:  12/25/16 198 lb (89.8 kg)  12/18/16 200 lb (90.7 kg)  09/10/16 200 lb (90.7 kg)     Physical Exam  Constitutional: He is oriented to person, place, and time. He appears well-developed and well-nourished. No distress.  HENT:  Head: Normocephalic and atraumatic.  Right Ear: External ear normal.  Left Ear:  External ear normal.  Nose: Nose normal.  Mouth/Throat: Oropharynx is clear and moist.  Eyes: Pupils are equal, round, and reactive to light. Conjunctivae and EOM are normal.  Neck: Normal range of motion. Neck supple. No thyromegaly present.  Cardiovascular: Normal rate, regular rhythm and normal heart sounds.   No murmur heard. Pulmonary/Chest: Effort normal and breath sounds normal. No respiratory distress. He has no wheezes. He has no rales.   Abdominal: Soft. Bowel sounds are normal. He exhibits no distension. There is no tenderness.  Musculoskeletal: He exhibits tenderness (lumbar paraspinous musculature).  Lymphadenopathy:    He has no cervical adenopathy.  Neurological: He is alert and oriented to person, place, and time. He has normal reflexes.  Skin: Skin is warm and dry.  Psychiatric: He has a normal mood and affect. His behavior is normal. Judgment and thought content normal.      Assessment & Plan:   Tyler Pruitt was seen today for hypertension.  Diagnoses and all orders for this visit:  Essential hypertension -     lisinopril (PRINIVIL,ZESTRIL) 10 MG tablet; Take 1 tablet (10 mg total) by mouth daily. -     Cancel: CBC with Differential/Platelet  Mixed hyperlipidemia -     Cancel: CMP14+EGFR -     Cancel: Lipid panel  Vitamin D deficiency -     Cancel: VITAMIN D 25 Hydroxy (Vit-D Deficiency, Fractures)  Low testosterone -     Cancel: Testosterone,Free and Total  Screening for prostate cancer -     Cancel: PSA, total and free  Need for immunization against influenza -     Flu Vaccine QUAD 36+ mos IM  Other orders -     traMADol (ULTRAM) 50 MG tablet; Take 1 tablet (50 mg total) by mouth every 6 (six) hours as needed. -     doxepin (SINEQUAN) 25 MG capsule; Take 1 capsule (25 mg total) by mouth at bedtime. -     hydrochlorothiazide (HYDRODIURIL) 25 MG tablet; Take 1 tablet (25 mg total) by mouth daily. -     omeprazole (PRILOSEC) 40 MG capsule; Take 1 capsule (40 mg total) by mouth daily.   Allergies as of 12/25/2016      Reactions   Pravastatin    Hurting "badly"      Medication List       Accurate as of 12/25/16 11:59 PM. Always use your most recent med list.          aspirin 81 MG tablet Take 81 mg by mouth daily.   cyclobenzaprine 10 MG tablet Commonly known as:  FLEXERIL Take 1 tablet (10 mg total) by mouth 3 (three) times daily as needed for muscle spasms.   diclofenac 75 MG EC  tablet Commonly known as:  VOLTAREN Take 1 tablet (75 mg total) by mouth 2 (two) times daily.   doxepin 25 MG capsule Commonly known as:  SINEQUAN Take 1 capsule (25 mg total) by mouth at bedtime.   fluticasone 50 MCG/ACT nasal spray Commonly known as:  FLONASE Place 2 sprays into both nostrils daily.   hydrochlorothiazide 25 MG tablet Commonly known as:  HYDRODIURIL Take 1 tablet (25 mg total) by mouth daily.   lisinopril 10 MG tablet Commonly known as:  PRINIVIL,ZESTRIL Take 1 tablet (10 mg total) by mouth daily.   omeprazole 40 MG capsule Commonly known as:  PRILOSEC Take 1 capsule (40 mg total) by mouth daily.   traMADol 50 MG tablet Commonly known as:  ULTRAM Take 1 tablet (50  mg total) by mouth every 6 (six) hours as needed.       Meds ordered this encounter  Medications  . traMADol (ULTRAM) 50 MG tablet    Sig: Take 1 tablet (50 mg total) by mouth every 6 (six) hours as needed.    Dispense:  60 tablet    Refill:  0  . lisinopril (PRINIVIL,ZESTRIL) 10 MG tablet    Sig: Take 1 tablet (10 mg total) by mouth daily.    Dispense:  90 tablet    Refill:  1  . doxepin (SINEQUAN) 25 MG capsule    Sig: Take 1 capsule (25 mg total) by mouth at bedtime.    Dispense:  90 capsule    Refill:  1  . hydrochlorothiazide (HYDRODIURIL) 25 MG tablet    Sig: Take 1 tablet (25 mg total) by mouth daily.    Dispense:  90 tablet    Refill:  1  . omeprazole (PRILOSEC) 40 MG capsule    Sig: Take 1 capsule (40 mg total) by mouth daily.    Dispense:  90 capsule    Refill:  1    Encouraged home exercise plan, physical therapy. Continue meds as is  Follow-up: Return in about 1 month (around 01/25/2017).  Claretta Fraise, M.D.

## 2016-12-26 ENCOUNTER — Other Ambulatory Visit: Payer: BLUE CROSS/BLUE SHIELD

## 2016-12-26 DIAGNOSIS — R7989 Other specified abnormal findings of blood chemistry: Secondary | ICD-10-CM | POA: Diagnosis not present

## 2016-12-26 DIAGNOSIS — Z125 Encounter for screening for malignant neoplasm of prostate: Secondary | ICD-10-CM | POA: Diagnosis not present

## 2016-12-26 DIAGNOSIS — I10 Essential (primary) hypertension: Secondary | ICD-10-CM | POA: Diagnosis not present

## 2016-12-26 DIAGNOSIS — E559 Vitamin D deficiency, unspecified: Secondary | ICD-10-CM | POA: Diagnosis not present

## 2016-12-26 DIAGNOSIS — E782 Mixed hyperlipidemia: Secondary | ICD-10-CM | POA: Diagnosis not present

## 2016-12-28 ENCOUNTER — Ambulatory Visit: Payer: BLUE CROSS/BLUE SHIELD | Admitting: Family Medicine

## 2016-12-28 LAB — LIPID PANEL
Chol/HDL Ratio: 4.1 ratio (ref 0.0–5.0)
Cholesterol, Total: 200 mg/dL — ABNORMAL HIGH (ref 100–199)
HDL: 49 mg/dL (ref >39)
LDL Calculated: 130 mg/dL — ABNORMAL HIGH (ref 0–99)
Triglycerides: 105 mg/dL (ref 0–149)
VLDL Cholesterol Cal: 21 mg/dL (ref 5–40)

## 2016-12-28 LAB — CBC WITH DIFFERENTIAL/PLATELET
Basophils Absolute: 0 10*3/uL (ref 0.0–0.2)
Basos: 1 %
EOS (ABSOLUTE): 0.2 10*3/uL (ref 0.0–0.4)
Eos: 2 %
Hematocrit: 44.8 % (ref 37.5–51.0)
Hemoglobin: 14.4 g/dL (ref 13.0–17.7)
Immature Grans (Abs): 0.1 10*3/uL (ref 0.0–0.1)
Immature Granulocytes: 1 %
Lymphocytes Absolute: 2.3 10*3/uL (ref 0.7–3.1)
Lymphs: 27 %
MCH: 29.7 pg (ref 26.6–33.0)
MCHC: 32.1 g/dL (ref 31.5–35.7)
MCV: 92 fL (ref 79–97)
Monocytes Absolute: 0.7 10*3/uL (ref 0.1–0.9)
Monocytes: 8 %
Neutrophils Absolute: 5.5 10*3/uL (ref 1.4–7.0)
Neutrophils: 61 %
Platelets: 251 10*3/uL (ref 150–379)
RBC: 4.85 x10E6/uL (ref 4.14–5.80)
RDW: 13.8 % (ref 12.3–15.4)
WBC: 8.8 10*3/uL (ref 3.4–10.8)

## 2016-12-28 LAB — CMP14+EGFR
ALT: 86 IU/L — ABNORMAL HIGH (ref 0–44)
AST: 39 IU/L (ref 0–40)
Albumin/Globulin Ratio: 1.6 (ref 1.2–2.2)
Albumin: 4.5 g/dL (ref 3.5–5.5)
Alkaline Phosphatase: 72 IU/L (ref 39–117)
BUN/Creatinine Ratio: 25 — ABNORMAL HIGH (ref 9–20)
BUN: 23 mg/dL (ref 6–24)
Bilirubin Total: 0.3 mg/dL (ref 0.0–1.2)
CO2: 25 mmol/L (ref 20–29)
Calcium: 9.4 mg/dL (ref 8.7–10.2)
Chloride: 98 mmol/L (ref 96–106)
Creatinine, Ser: 0.93 mg/dL (ref 0.76–1.27)
GFR calc Af Amer: 104 mL/min/{1.73_m2} (ref >59)
GFR calc non Af Amer: 90 mL/min/{1.73_m2} (ref >59)
Globulin, Total: 2.9 g/dL (ref 1.5–4.5)
Glucose: 92 mg/dL (ref 65–99)
Potassium: 4.5 mmol/L (ref 3.5–5.2)
Sodium: 141 mmol/L (ref 134–144)
Total Protein: 7.4 g/dL (ref 6.0–8.5)

## 2016-12-28 LAB — VITAMIN D 25 HYDROXY (VIT D DEFICIENCY, FRACTURES): Vit D, 25-Hydroxy: 28.1 ng/mL — ABNORMAL LOW (ref 30.0–100.0)

## 2016-12-28 LAB — TESTOSTERONE,FREE AND TOTAL
Testosterone, Free: 8.8 pg/mL (ref 7.2–24.0)
Testosterone: 350 ng/dL (ref 264–916)

## 2016-12-28 LAB — PSA, TOTAL AND FREE
PSA, Free Pct: 35 %
PSA, Free: 0.35 ng/mL
Prostate Specific Ag, Serum: 1 ng/mL (ref 0.0–4.0)

## 2016-12-29 ENCOUNTER — Ambulatory Visit: Payer: BLUE CROSS/BLUE SHIELD | Attending: Family Medicine | Admitting: Physical Therapy

## 2016-12-29 DIAGNOSIS — M545 Low back pain, unspecified: Secondary | ICD-10-CM

## 2016-12-29 DIAGNOSIS — R293 Abnormal posture: Secondary | ICD-10-CM
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
## 2016-12-31 ENCOUNTER — Ambulatory Visit: Payer: BLUE CROSS/BLUE SHIELD | Admitting: Physical Therapy

## 2016-12-31 ENCOUNTER — Encounter: Payer: Self-pay | Admitting: Physical Therapy

## 2016-12-31 ENCOUNTER — Telehealth: Payer: Self-pay | Admitting: Family Medicine

## 2016-12-31 ENCOUNTER — Other Ambulatory Visit: Payer: Self-pay | Admitting: *Deleted

## 2016-12-31 DIAGNOSIS — M545 Low back pain, unspecified: Secondary | ICD-10-CM

## 2016-12-31 DIAGNOSIS — R293 Abnormal posture: Secondary | ICD-10-CM | POA: Diagnosis not present

## 2016-12-31 MED ORDER — VITAMIN D (ERGOCALCIFEROL) 1.25 MG (50000 UNIT) PO CAPS: 50000.0000 [IU] | ORAL_CAPSULE | ORAL | 0 refills | Status: DC

## 2016-12-31 NOTE — Therapy (Signed)
[facility] [place], [place], [address] Phone: [phone]   Fax:  [phone]  Physical Therapy Treatment  Patient Details  Name: Tyler Pruitt. MRN: [id number] Date of Birth: 08/14/1955 Referring Provider: Claretta Fraise MD  Encounter Date: 12/31/2016      PT End of Session - 12/31/16 1655    Visit Number 2   Number of Visits 12   Date for PT Re-Evaluation 02/09/17   Authorization Type FOTO...EVERY 5TH VISIT.   PT Start Time 1646   PT Stop Time 1736   PT Time Calculation (min) 50 min   Activity Tolerance Patient tolerated treatment well   Behavior During Therapy [facility] for tasks assessed/performed      Past Medical History:  Diagnosis Date  . Anxiety    generalized anxiety disorder  . Fatty liver   . GAD (generalized anxiety disorder)   . GERD (gastroesophageal reflux disease)   . Hyperlipidemia   . Hypertension   . IBS (irritable bowel syndrome)   . Low testosterone   . Palpitations   . Palpitations   . Vitamin D deficiency     Past Surgical History:  Procedure Laterality Date  . None      There were no vitals filed for this visit.      Subjective Assessment - 12/31/16 1654    Subjective Reports his back has been throbbing today but would go away.   Limitations Standing   How long can you stand comfortably? 20-30 minutes.   Patient Stated Goals Get back to where I was.   Currently in Pain? Yes   Pain Score 4    Pain Location Back   Pain Orientation Left   Pain Descriptors / Indicators Throbbing   Pain Type Acute pain   Pain Onset 1 to 4 weeks ago   Pain Frequency Intermittent            [facility] PT Assessment - 12/31/16 0001      Assessment   Medical Diagnosis Lumbar back pain.   Next MD Visit 06/2017     Precautions   Precautions None     Restrictions   Weight Bearing Restrictions No                     [facility] Adult PT Treatment/Exercise - 12/31/16 0001      Exercises   Exercises Lumbar     Lumbar Exercises: Stretches   Single Knee to Chest Stretch 3 reps;30 seconds   Piriformis Stretch 3 reps;30 seconds     Lumbar Exercises: Aerobic   Stationary Bike NuStep L5 x12 min     Lumbar Exercises: Supine   Ab Set 10 reps;5 seconds   Isometric Hip Flexion 15 reps;5 seconds  [name] into wall for pelvic correction   Other Supine Lumbar Exercises B adductor squeeze x15 reps 5 sec hold     Modalities   Modalities Electrical Stimulation;Moist Heat     Moist Heat Therapy   Number Minutes Moist Heat 15 Minutes   Moist Heat Location Lumbar Spine     Electrical Stimulation   Electrical Stimulation Location Left low back.   Electrical Stimulation Action Pre-Mod   Electrical Stimulation Parameters 80-150 hz x15 min   Electrical Stimulation Goals Pain                     PT Long Term Goals - 12/29/16 1724      PT LONG TERM GOAL #1  Title Independent with a HEP.   Time 6   Period Weeks   Status New     PT LONG TERM GOAL #2   Title Stand 30 minutes with pain not > 2/10.   Time 6   Period Weeks   Status New     PT LONG TERM GOAL #3   Title Perform ADL's/work activites with pain not > 2-3/10.   Time 6   Period Weeks   Status New               Plan - 12/31/16 1741    Clinical Impression Statement Patient arrived to treatment with mid level L LBP pain today. Patient able to tolerate MET exercises for L pelvic correction well without complaint of any increased pain. Patient also educated regarding purpose of core activation to improve lumbar stability. Patient compliant with core activation technique. Normal modalities response noted following removal of the modalities.   Rehab Potential Excellent   PT Frequency 2x / week   PT Duration 6 weeks   PT Treatment/Interventions ADLs/Self Care Home Management;Cryotherapy;Electrical Stimulation;Ultrasound;Therapeutic activities;Therapeutic exercise;Patient/family education;Manual  techniques;Passive range of motion   PT Next Visit Plan Adductor squeeze and left SKTC to equalize leg lengths.  May consider int traction beginning at 80#.  Modalites and STW/M to affected low back region and core exercise progression.  FOTO taken.   Consulted and Agree with Plan of Care Patient      Patient will benefit from skilled therapeutic intervention in order to improve the following deficits and impairments:  Pain, Postural dysfunction, Decreased activity tolerance, Decreased range of motion  Visit Diagnosis: Acute left-sided low back pain without sciatica  Abnormal posture     Problem List Patient Active Problem List   Diagnosis Date Noted  . Fatty liver   . Low testosterone   . Vitamin D deficiency   . Obesity (BMI 30.0-34.9) 05/08/2013  . Hyperglycemia 02/06/2013  . HTN (hypertension) 12/22/2011  . Hyperlipidemia 12/22/2011  . Palpitations 12/22/2011  . Anxiety 12/22/2011    Wynelle Fanny, PTA 12/31/2016, 5:52 PM  [facility] [address] [place], [place], [address] Phone: [phone]   Fax:  [phone]  Name: Tyler Pruitt. MRN: [id number] Date of Birth: [birth year]

## 2016-12-31 NOTE — Telephone Encounter (Signed)
rx sent into pharmacy

## 2017-01-05 ENCOUNTER — Ambulatory Visit: Payer: BLUE CROSS/BLUE SHIELD | Admitting: Physical Therapy

## 2017-01-05 ENCOUNTER — Telehealth: Payer: Self-pay

## 2017-01-05 ENCOUNTER — Encounter: Payer: Self-pay | Admitting: Physical Therapy

## 2017-01-05 DIAGNOSIS — M545 Low back pain, unspecified: Secondary | ICD-10-CM

## 2017-01-05 DIAGNOSIS — R293 Abnormal posture: Secondary | ICD-10-CM | POA: Diagnosis not present

## 2017-01-05 NOTE — Therapy (Signed)
[facility] [place], [place], [address] Phone: [phone]   Fax:  [phone]  Physical Therapy Treatment  Patient Details  Name: Tyler Pruitt. MRN: [id number] Date of Birth: Mar 01, 1956 Referring Provider: Claretta Fraise MD  Encounter Date: 01/05/2017      PT End of Session - 01/05/17 1646    Visit Number 3   Number of Visits 12   Date for PT Re-Evaluation 02/09/17   Authorization Type FOTO...EVERY 5TH VISIT.   PT Start Time 1648   PT Stop Time 1741   PT Time Calculation (min) 53 min   Activity Tolerance Patient tolerated treatment well   Behavior During Therapy WFL for tasks assessed/performed      Past Medical History:  Diagnosis Date  . Anxiety    generalized anxiety disorder  . Fatty liver   . GAD (generalized anxiety disorder)   . GERD (gastroesophageal reflux disease)   . Hyperlipidemia   . Hypertension   . IBS (irritable bowel syndrome)   . Low testosterone   . Palpitations   . Palpitations   . Vitamin D deficiency     Past Surgical History:  Procedure Laterality Date  . None      There were no vitals filed for this visit.      Subjective Assessment - 01/05/17 1645    Subjective Reports only an ache in low back. 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 Reports wearing a back brace and being careful with activities.   Limitations Standing   How long can you stand comfortably? 20-30 minutes.   Patient Stated Goals Get back to where I was.   Currently in Pain? Yes   Pain Score 3    Pain Location Back   Pain Orientation Left   Pain Descriptors / Indicators Aching   Pain Type Acute pain   Pain Onset 1 to 4 weeks ago            [facility] PT Assessment - 01/05/17 0001      Assessment   Medical Diagnosis Lumbar back pain.   Next MD Visit 06/2017     Precautions   Precautions None     Restrictions   Weight  Bearing Restrictions No                     [facility] Adult PT Treatment/Exercise - 01/05/17 0001      Lumbar Exercises: Stretches   Active Hamstring Stretch 3 reps;30 seconds   Single Knee to Chest Stretch 3 reps;30 seconds   Piriformis Stretch 3 reps;30 seconds     Lumbar Exercises: Aerobic   Stationary Bike NuStep L5 x12 min     Lumbar Exercises: Standing   Other Standing Lumbar Exercises L QL stretch 2x20 sec     Lumbar Exercises: Supine   Ab Set 15 reps;5 seconds   Bridge 10 reps   Isometric Hip Flexion 15 reps;5 seconds  into wall for L pelvic correction   Other Supine Lumbar Exercises B adductor squeeze x15 reps 5 sec hold     Modalities   Modalities Electrical Stimulation;Moist Heat     Moist Heat Therapy   Number Minutes Moist Heat 15 Minutes   Moist Heat Location Lumbar Spine     Electrical Stimulation   Electrical Stimulation Location L low back   Electrical Stimulation Action Pre-Mod   Electrical Stimulation Parameters  80-150 hz x15 min   Electrical Stimulation Goals Pain                     PT Long Term Goals - 12/29/16 1724      PT LONG TERM GOAL #1   Title Independent with a HEP.   Time 6   Period Weeks   Status New     PT LONG TERM GOAL #2   Title Stand 30 minutes with pain not > 2/10.   Time 6   Period Weeks   Status New     PT LONG TERM GOAL #3   Title Perform ADL's/work activites with pain not > 2-3/10.   Time 6   Period Weeks   Status New               Plan - 01/05/17 1727    Clinical Impression Statement Patient presented in clinic with reports of improvement in regards to pain. Patient tolerated continued MET exercises well without complaint although patient did experience tightening sensation in low back. Patient guided through many low back stretches with tightness in HS and QL. No pain reported by bridges reported by patient. Normal modalities response noted following removal of the modalities.   Rehab  Potential Excellent   PT Frequency 2x / week   PT Duration 6 weeks   PT Treatment/Interventions ADLs/Self Care Home Management;Cryotherapy;Electrical Stimulation;Ultrasound;Therapeutic activities;Therapeutic exercise;Patient/family education;Manual techniques;Passive range of motion   PT Next Visit Plan Adductor squeeze and left SKTC to equalize leg lengths.  May consider int traction beginning at 80#.  Modalites and STW/M to affected low back region and core exercise progression.  FOTO taken.   Consulted and Agree with Plan of Care Patient      Patient will benefit from skilled therapeutic intervention in order to improve the following deficits and impairments:  Pain, Postural dysfunction, Decreased activity tolerance, Decreased range of motion  Visit Diagnosis: Acute left-sided low back pain without sciatica  Abnormal posture     Problem List Patient Active Problem List   Diagnosis Date Noted  . Fatty liver   . Low testosterone   . Vitamin D deficiency   . Obesity (BMI 30.0-34.9) 05/08/2013  . Hyperglycemia 02/06/2013  . HTN (hypertension) 12/22/2011  . Hyperlipidemia 12/22/2011  . Palpitations 12/22/2011  . Anxiety 12/22/2011    Wynelle Fanny, PTA 01/05/2017, 5:45 PM  [facility] [address] [place], [place], [address] Phone: [phone]   Fax:  [phone]  Name: Tyler Pruitt. MRN: [id number] Date of Birth: 1955-12-23

## 2017-01-05 NOTE — Telephone Encounter (Signed)
Please appeal

## 2017-01-05 NOTE — Telephone Encounter (Signed)
Insurance denied Tramadol  Limited to 7 day supply  Over the limit not covered

## 2017-01-07 ENCOUNTER — Telehealth: Payer: Self-pay

## 2017-01-07 ENCOUNTER — Ambulatory Visit: Payer: BLUE CROSS/BLUE SHIELD | Admitting: Physical Therapy

## 2017-01-07 ENCOUNTER — Encounter: Payer: Self-pay | Admitting: Physical Therapy

## 2017-01-07 DIAGNOSIS — M545 Low back pain, unspecified: Secondary | ICD-10-CM

## 2017-01-07 DIAGNOSIS — R293 Abnormal posture: Secondary | ICD-10-CM | POA: Diagnosis not present

## 2017-01-07 NOTE — Therapy (Signed)
[facility] [place], [place], [address] Phone: [phone]   Fax:  [phone]  Physical Therapy Treatment  Patient Details  Name: Tyler Pruitt. MRN: [id number] Date of Birth: 1956-01-25 Referring Provider: Claretta Fraise MD  Encounter Date: 01/07/2017      PT End of Session - 01/07/17 1703    Visit Number 4   Number of Visits 12   Date for PT Re-Evaluation 02/09/17   Authorization Type FOTO...EVERY 5TH VISIT.   PT Start Time 1650   PT Stop Time 1742   PT Time Calculation (min) 52 min   Activity Tolerance Patient tolerated treatment well   Behavior During Therapy WFL for tasks assessed/performed      Past Medical History:  Diagnosis Date  . Anxiety    generalized anxiety disorder  . Fatty liver   . GAD (generalized anxiety disorder)   . GERD (gastroesophageal reflux disease)   . Hyperlipidemia   . Hypertension   . IBS (irritable bowel syndrome)   . Low testosterone   . Palpitations   . Palpitations   . Vitamin D deficiency     Past Surgical History:  Procedure Laterality Date  . None      There were no vitals filed for this visit.      Subjective Assessment - 01/07/17 1701    Subjective Reports ache intermittant this morning. Felt like something aggravated or bruised while driving to PT.   Limitations Standing   How long can you stand comfortably? 20-30 minutes.   Patient Stated Goals Get back to where I was.   Currently in Pain? Yes   Pain Score 4    Pain Location Back   Pain Orientation Left   Pain Descriptors / Indicators Tightness   Pain Type Acute pain   Pain Onset 1 to 4 weeks ago   Pain Frequency Intermittent            [facility] PT Assessment - 01/07/17 0001      Assessment   Medical Diagnosis Lumbar back pain.   Next MD Visit 06/2017     Precautions   Precautions None     Restrictions   Weight Bearing Restrictions No                     [facility] Adult PT  Treatment/Exercise - 01/07/17 0001      Lumbar Exercises: Stretches   Active Hamstring Stretch 2 reps;30 seconds   Lower Trunk Rotation 5 reps;10 seconds   Standing Side Bend 2 reps;30 seconds   Piriformis Stretch 2 reps;30 seconds     Lumbar Exercises: Aerobic   Stationary Bike NuStep L5 x13 min     Lumbar Exercises: Supine   Bridge 15 reps   Straight Leg Raise 10 reps   Other Supine Lumbar Exercises B adductor squeeze x15 reps 5 sec hold     Modalities   Modalities Electrical Stimulation;Moist Heat     Moist Heat Therapy   Number Minutes Moist Heat 15 Minutes   Moist Heat Location Lumbar Spine     Electrical Stimulation   Electrical Stimulation Location L low back   Electrical Stimulation Action Pre-Mod   Electrical Stimulation Parameters 80-150 hz x15 min   Electrical Stimulation Goals Pain                     PT Long Term Goals - 12/29/16 1724      PT LONG TERM GOAL #1  Title Independent with a HEP.   Time 6   Period Weeks   Status New     PT LONG TERM GOAL #2   Title Stand 30 minutes with pain not > 2/10.   Time 6   Period Weeks   Status New     PT LONG TERM GOAL #3   Title Perform ADL's/work activites with pain not > 2-3/10.   Time 6   Period Weeks   Status New               Plan - 01/07/17 1738    Clinical Impression Statement Patient presented in clinic with intermittant reports of pain in L low back. Patient continued stretches for low back with patient reported good stretch with LTR. No other complaints with low back exercises today. Normal modalities response noted following removal of the modalities.   Rehab Potential Excellent   PT Frequency 2x / week   PT Duration 6 weeks   PT Treatment/Interventions ADLs/Self Care Home Management;Cryotherapy;Electrical Stimulation;Ultrasound;Therapeutic activities;Therapeutic exercise;Patient/family education;Manual techniques;Passive range of motion   PT Next Visit Plan Adductor squeeze  and left SKTC to equalize leg lengths.  May consider int traction beginning at 80#.  Modalites and STW/M to affected low back region and core exercise progression.  FOTO taken.   Consulted and Agree with Plan of Care Patient      Patient will benefit from skilled therapeutic intervention in order to improve the following deficits and impairments:  Pain, Postural dysfunction, Decreased activity tolerance, Decreased range of motion  Visit Diagnosis: Acute left-sided low back pain without sciatica  Abnormal posture     Problem List Patient Active Problem List   Diagnosis Date Noted  . Fatty liver   . Low testosterone   . Vitamin D deficiency   . Obesity (BMI 30.0-34.9) 05/08/2013  . Hyperglycemia 02/06/2013  . HTN (hypertension) 12/22/2011  . Hyperlipidemia 12/22/2011  . Palpitations 12/22/2011  . Anxiety 12/22/2011    Wynelle Fanny, PTA 01/07/2017, 5:47 PM  [facility] [address] [place], [place], [address] Phone: [phone]   Fax:  [phone]  Name: Tyler Pruitt. MRN: [id number] Date of Birth: Sep 12, 1955

## 2017-01-07 NOTE — Telephone Encounter (Signed)
I need an explanation to appeal the denial of Tramadol

## 2017-01-08 NOTE — Telephone Encounter (Signed)
The problem is low back pain. It is better explained in my progress note of 10/ 5 than in the follow up of 10/12. Thanks, WS

## 2017-01-09 NOTE — Telephone Encounter (Signed)
Please advise 

## 2017-01-12 ENCOUNTER — Ambulatory Visit: Payer: BLUE CROSS/BLUE SHIELD | Admitting: Physical Therapy

## 2017-01-12 DIAGNOSIS — M545 Low back pain, unspecified: Secondary | ICD-10-CM

## 2017-01-12 DIAGNOSIS — R293 Abnormal posture: Secondary | ICD-10-CM

## 2017-01-12 NOTE — Therapy (Signed)
[facility] [place], [place], [address] Phone: [phone]   Fax:  [phone]  Physical Therapy Treatment  Patient Details  Name: Tyler Pruitt. MRN: [id number] Date of Birth: [birth year] Referring Provider: Claretta Fraise MD  Encounter Date: 01/12/2017      PT End of Session - 01/12/17 1808    Visit Number 5   Number of Visits 12   Date for PT Re-Evaluation 02/09/17   Authorization Type FOTO...EVERY 5TH VISIT.   PT Start Time 0445   PT Stop Time 0534   PT Time Calculation (min) 49 min   Activity Tolerance Patient tolerated treatment well   Behavior During Therapy [facility] for tasks assessed/performed      Past Medical History:  Diagnosis Date  . Anxiety    generalized anxiety disorder  . Fatty liver   . GAD (generalized anxiety disorder)   . GERD (gastroesophageal reflux disease)   . Hyperlipidemia   . Hypertension   . IBS (irritable bowel syndrome)   . Low testosterone   . Palpitations   . Palpitations   . Vitamin D deficiency     Past Surgical History:  Procedure Laterality Date  . None      There were no vitals filed for this visit.      Subjective Assessment - 01/12/17 1809    Subjective I missed a step while on a ladder at home and fell into the bathtub.  It aggravated my back.   Pain Score 5    Pain Location Back   Pain Orientation Left   Pain Descriptors / Indicators Tightness   Pain Type Acute pain   Pain Onset 1 to 4 weeks ago                         [facility] Adult PT Treatment/Exercise - 01/12/17 0001      Exercises   Exercises Lumbar     Lumbar Exercises: Aerobic   Stationary Bike Nustep @ level 5 x 15 minutes.     Modalities   Modalities Cryotherapy;Moist Heat     Moist Heat Therapy   Number Minutes Moist Heat 20 Minutes   Moist Heat Location Lumbar Spine     Electrical Stimulation   Electrical Stimulation Location --  Left SIJ region.   Electrical  Stimulation Action --  Pre-mod.   Electrical Stimulation Parameters 80-150 Hz x 20 minutes.   Electrical Stimulation Goals Pain     Manual Therapy   Manual Therapy Soft tissue mobilization   Soft tissue mobilization In prone:  STW/M to left SIJ/upper gluteal region x 8 minutes.                     PT Long Term Goals - 12/29/16 1724      PT LONG TERM GOAL #1   Title Independent with a HEP.   Time 6   Period Weeks   Status New     PT LONG TERM GOAL #2   Title Stand 30 minutes with pain not > 2/10.   Time 6   Period Weeks   Status New     PT LONG TERM GOAL #3   Title Perform ADL's/work activites with pain not > 2-3/10.   Time 6   Period Weeks   Status New               Plan - 01/12/17 1813    Clinical Impression Statement Patient  was quite tender over his left iliolumbar ligament.  He felt much better after treatment.   PT Treatment/Interventions ADLs/Self Care Home Management;Cryotherapy;Electrical Stimulation;Ultrasound;Therapeutic activities;Therapeutic exercise;Patient/family education;Manual techniques;Passive range of motion   PT Next Visit Plan Adductor squeeze and left SKTC to equalize leg lengths.  May consider int traction beginning at 80#.  Modalites and STW/M to affected low back region and core exercise progression.  FOTO taken.   PT Home Exercise Plan Can try int traction at 80#.   Consulted and Agree with Plan of Care Patient      Patient will benefit from skilled therapeutic intervention in order to improve the following deficits and impairments:     Visit Diagnosis: Acute left-sided low back pain without sciatica  Abnormal posture     Problem List Patient Active Problem List   Diagnosis Date Noted  . Fatty liver   . Low testosterone   . Vitamin D deficiency   . Obesity (BMI 30.0-34.9) 05/08/2013  . Hyperglycemia 02/06/2013  . HTN (hypertension) 12/22/2011  . Hyperlipidemia 12/22/2011  . Palpitations 12/22/2011  . Anxiety  12/22/2011    Michol Emory, [place] MPT 01/12/2017, 6:15 PM  [facility] [address] [place], [place], [address] Phone: [phone]   Fax:  [phone]  Name: Tyler Pruitt. MRN: [id number] Date of Birth: [birth year]

## 2017-01-14 ENCOUNTER — Other Ambulatory Visit: Payer: Self-pay | Admitting: Family Medicine

## 2017-01-19 ENCOUNTER — Ambulatory Visit: Payer: BLUE CROSS/BLUE SHIELD | Attending: Family Medicine | Admitting: *Deleted

## 2017-01-19 DIAGNOSIS — M545 Low back pain, unspecified: Secondary | ICD-10-CM

## 2017-01-19 DIAGNOSIS — R293 Abnormal posture: Secondary | ICD-10-CM

## 2017-01-19 NOTE — Therapy (Signed)
[facility] [place], [place], [address] Phone: [phone]   Fax:  [phone]  Physical Therapy Treatment  Patient Details  Name: Tyler Pruitt. MRN: [id number] Date of Birth: Jun 21, 1955 Referring Provider: Claretta Fraise MD   Encounter Date: 01/19/2017  PT End of Session - 01/19/17 1800    Visit Number  6    Number of Visits  12    Date for PT Re-Evaluation  02/09/17    Authorization Type  FOTO...EVERY 5TH VISIT.    PT Start Time  1645    PT Stop Time  1740    PT Time Calculation (min)  55 min       Past Medical History:  Diagnosis Date  . Anxiety    generalized anxiety disorder  . Fatty liver   . GAD (generalized anxiety disorder)   . GERD (gastroesophageal reflux disease)   . Hyperlipidemia   . Hypertension   . IBS (irritable bowel syndrome)   . Low testosterone   . Palpitations   . Palpitations   . Vitamin D deficiency     Past Surgical History:  Procedure Laterality Date  . None      There were no vitals filed for this visit.  Subjective Assessment - 01/19/17 1650    Limitations  Standing    How long can you stand comfortably?  20-30 minutes.    Patient Stated Goals  Get back to where I was.    Currently in Pain?  Yes                      [facility] Adult PT Treatment/Exercise - 01/19/17 0001      Exercises   Exercises  Lumbar      Lumbar Exercises: Aerobic   Stationary Bike  Nustep @ level 5 x 15 minutes.      Lumbar Exercises: Quadruped   [place]  20 reps      Modalities   Modalities  Cryotherapy;Moist Heat;Ultrasound      Moist Heat Therapy   Number Minutes Moist Heat  15 Minutes    Moist Heat Location  Lumbar Spine      Electrical Stimulation   Electrical Stimulation Location  LT LB SIJ  premod 80-150hz   x 15 mins Left SIJ region.   Left SIJ region.   Electrical Stimulation Goals  Pain      Ultrasound   Ultrasound Location  LB paras in prone    Ultrasound Parameters  1.5 w/cm2 x 10 mins    Ultrasound Goals  Pain      Manual Therapy   Manual Therapy  Soft tissue mobilization    Soft tissue mobilization  In prone:  STW/M to left SIJ/upper gluteal region x 8 minutes.                  PT Long Term Goals - 12/29/16 1724      PT LONG TERM GOAL #1   Title  Independent with a HEP.    Time  6    Period  Weeks    Status  New      PT LONG TERM GOAL #2   Title  Stand 30 minutes with pain not > 2/10.    Time  6    Period  Weeks    Status  New      PT LONG TERM GOAL #3   Title  Perform ADL's/work activites with pain not > 2-3/10.  Time  6    Period  Weeks    Status  New            Plan - 01/19/17 1804    Clinical Impression Statement  Pt responded well to Rx today with decreased pain and increased mobility control in LB. Cat and Camel mobility was given for HEP. Please review again next Rx.    Clinical Presentation  Stable    Clinical Decision Making  Low    Rehab Potential  Excellent    PT Frequency  2x / week    PT Duration  6 weeks    PT Treatment/Interventions  ADLs/Self Care Home Management;Cryotherapy;Electrical Stimulation;Ultrasound;Therapeutic activities;Therapeutic exercise;Patient/family education;Manual techniques;Passive range of motion    PT Next Visit Plan  Adductor squeeze and left SKTC to equalize leg lengths.  May consider int traction beginning at 80#.  Modalites and STW/M to affected low back region and core exercise progression.  FOTO taken.       Patient will benefit from skilled therapeutic intervention in order to improve the following deficits and impairments:  Pain, Postural dysfunction, Decreased activity tolerance, Decreased range of motion  Visit Diagnosis: No diagnosis found.     Problem List Patient Active Problem List   Diagnosis Date Noted  . Fatty liver   . Low testosterone   . Vitamin D deficiency   . Obesity (BMI 30.0-34.9) 05/08/2013  . Hyperglycemia  02/06/2013  . HTN (hypertension) 12/22/2011  . Hyperlipidemia 12/22/2011  . Palpitations 12/22/2011  . Anxiety 12/22/2011    RAMSEUR,CHRIS, PTA 01/19/2017, 6:08 PM  [facility] [address] [place], [place], [address] Phone: [phone]   Fax:  [phone]  Name: Jaxzen Vanhorn. MRN: [id number] Date of Birth: 07/27/1955

## 2017-01-21 ENCOUNTER — Ambulatory Visit: Payer: BLUE CROSS/BLUE SHIELD | Admitting: *Deleted

## 2017-01-21 DIAGNOSIS — R293 Abnormal posture: Secondary | ICD-10-CM | POA: Diagnosis not present

## 2017-01-21 DIAGNOSIS — M545 Low back pain, unspecified: Secondary | ICD-10-CM

## 2017-01-21 NOTE — Therapy (Addendum)
[facility] [place], [place], [address] Phone: [phone]   Fax:  [phone]  Physical Therapy Treatment  Patient Details  Name: Tyler Pruitt. MRN: [id number] Date of Birth: 1955/04/25 Referring Provider: Claretta Fraise MD   Encounter Date: 01/21/2017  PT End of Session - 01/21/17 1704    Visit Number  7    Number of Visits  12    Date for PT Re-Evaluation  02/09/17    Authorization Type  FOTO...EVERY 5TH VISIT.    PT Start Time  1649    PT Stop Time  1740    PT Time Calculation (min)  51 min       Past Medical History:  Diagnosis Date  . Anxiety    generalized anxiety disorder  . Fatty liver   . GAD (generalized anxiety disorder)   . GERD (gastroesophageal reflux disease)   . Hyperlipidemia   . Hypertension   . IBS (irritable bowel syndrome)   . Low testosterone   . Palpitations   . Palpitations   . Vitamin D deficiency     Past Surgical History:  Procedure Laterality Date  . None      There were no vitals filed for this visit.                   [facility] Adult PT Treatment/Exercise - 01/21/17 0001      Exercises   Exercises  Lumbar      Lumbar Exercises: Aerobic   Stationary Bike  Nustep @ level 5 x 15 minutes.      Moist Heat Therapy   Number Minutes Moist Heat  15 Minutes    Moist Heat Location  Lumbar Spine      Electrical Stimulation   Electrical Stimulation Location  LT LB SIJ  premod 80-_0   x 15 mins Left SIJ region.    Electrical Stimulation Goals  Pain      Ultrasound   Ultrasound Location  LB paras 1.5 w/cm2 x 12 mins     Ultrasound Goals  Pain      Manual Therapy   Manual Therapy  Soft tissue mobilization    Soft tissue mobilization  In prone:  STW/M to left SIJ/upper gluteal region x 8 minutes.                  PT Long Term Goals - 12/29/16 1724      PT LONG TERM GOAL #1   Title  Independent with a HEP.    Time  6    Period  Weeks     Status  New      PT LONG TERM GOAL #2   Title  Stand 30 minutes with pain not > 2/10.    Time  6    Period  Weeks    Status  New      PT LONG TERM GOAL #3   Title  Perform ADL's/work activites with pain not > 2-3/10.    Time  6    Period  Weeks    Status  New            Plan - 01/21/17 1740    Clinical Impression Statement  Pt arrived to clinic today doing fairly well, but continues to tightness and soreness at the end of the day. He reports that he has been to tired in the evenings after work to perform any exs. Rx focused on decreasing tightness in LB and discussing  using better body mechanics when possible to decrease LB stress.. Normal response after modailities. Decreased pain and tension after rx. No LTGs met due to pain levels.    Clinical Presentation  Stable    Rehab Potential  Excellent    PT Frequency  2x / week    PT Duration  6 weeks    PT Treatment/Interventions  ADLs/Self Care Home Management;Cryotherapy;Electrical Stimulation;Ultrasound;Therapeutic activities;Therapeutic exercise;Patient/family education;Manual techniques;Passive range of motion    PT Next Visit Plan  Adductor squeeze and left SKTC to equalize leg lengths.  May consider int traction beginning at 80#.  Modalites and STW/M to affected low back region and core exercise progression.  FOTO taken.    PT Home Exercise Plan  Can try int traction at 80#.    Consulted and Agree with Plan of Care  Patient       Patient will benefit from skilled therapeutic intervention in order to improve the following deficits and impairments:  Pain, Postural dysfunction, Decreased activity tolerance, Decreased range of motion  Visit Diagnosis: Acute left-sided low back pain without sciatica  Abnormal posture     Problem List Patient Active Problem List   Diagnosis Date Noted  . Fatty liver   . Low testosterone   . Vitamin D deficiency   . Obesity (BMI 30.0-34.9) 05/08/2013  . Hyperglycemia 02/06/2013  . HTN  (hypertension) 12/22/2011  . Hyperlipidemia 12/22/2011  . Palpitations 12/22/2011  . Anxiety 12/22/2011    Tyler Pruitt,CHRIS, PTA 01/21/2017, 5:54 PM  [facility] [address] [place], [place], [address] Phone: [phone]   Fax:  [phone]  Name: Tyler Pruitt. MRN: [id number] Date of Birth: [birth year]  PHYSICAL THERAPY DISCHARGE SUMMARY  Visits from Start of Care: 7.  Current functional level related to goals / functional outcomes: See above.   Remaining deficits: See below.   Education / Equipment: HEP. Plan: Patient agrees to discharge.  Patient goals were not met. Patient is being discharged due to not returning since the last visit.  ?????          Mali Applegate MPT

## 2017-01-26 ENCOUNTER — Encounter: Payer: BLUE CROSS/BLUE SHIELD | Admitting: Physical Therapy

## 2017-01-28 ENCOUNTER — Encounter: Payer: BLUE CROSS/BLUE SHIELD | Admitting: Physical Therapy

## 2017-02-17 ENCOUNTER — Other Ambulatory Visit: Payer: Self-pay | Admitting: Family Medicine

## 2017-02-25 ENCOUNTER — Other Ambulatory Visit: Payer: Self-pay | Admitting: Family Medicine

## 2017-04-17 ENCOUNTER — Other Ambulatory Visit: Payer: Self-pay | Admitting: Family Medicine

## 2017-05-20 ENCOUNTER — Other Ambulatory Visit: Payer: Self-pay | Admitting: Family Medicine

## 2017-06-14 ENCOUNTER — Other Ambulatory Visit: Payer: Self-pay | Admitting: Family Medicine

## 2017-06-18 ENCOUNTER — Ambulatory Visit (INDEPENDENT_AMBULATORY_CARE_PROVIDER_SITE_OTHER): Payer: BLUE CROSS/BLUE SHIELD

## 2017-06-18 ENCOUNTER — Encounter: Payer: Self-pay | Admitting: Family Medicine

## 2017-06-18 ENCOUNTER — Ambulatory Visit: Payer: BLUE CROSS/BLUE SHIELD | Admitting: Family Medicine

## 2017-06-18 VITALS — BP 157/98 | HR 62 | Temp 98.8°F | Ht 62.5 in | Wt 196.1 lb

## 2017-06-18 DIAGNOSIS — M545 Low back pain, unspecified: Secondary | ICD-10-CM

## 2017-06-18 MED ORDER — PREDNISONE 10 MG PO TABS: ORAL_TABLET | ORAL | 0 refills | Status: DC

## 2017-06-18 NOTE — Progress Notes (Signed)
Chief Complaint  Patient presents with  . Back Pain    HPI  Patient presents today for low back pain similar to last fall. This time it radiates from left SI region laterally toward the hip. Denies dysuria. Some relief with lying still. Worse when bending. 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  There is tenderness noted at the right L3-4 and 5 paraspinous musculature.  There is stiffness for straight leg raise at about 20 degrees.  The lower extremities are neurovascularly intact.  There is decreased flexion of about 30 degrees for the spine. Neuro: Alert and oriented, No gross deficits  2 weeks assessment and plan:  1. Lumbar back pain     Meds ordered this encounter  Medications  . predniSONE (DELTASONE) 10 MG tablet    Sig: Take 5 PO x 3 days, then 4 PO x 3 days, then 3 PO x 3 days, then 2 PO x 3 days then 1 PO x 3 days.    Dispense:  45 tablet    Refill:  0    Orders Placed This Encounter  Procedures  . DG Lumbar Spine 2-3 Views    Standing Status:   Future    Number of Occurrences:   1    Standing Expiration Date:   08/18/2018    Order Specific Question:   Reason for Exam (SYMPTOM  OR DIAGNOSIS REQUIRED)    Answer:   lumbar back pain    Order Specific Question:   Preferred imaging location?    Answer:   Internal    Follow up 2 weeks.  Patient attended physical therapy with his recent episode of back pain several months ago.  We will do the home regimen he was taught at that time he was encouraged to do that twice  daily.  Claretta Fraise, MD

## 2017-06-20 ENCOUNTER — Encounter: Payer: Self-pay | Admitting: Family Medicine

## 2017-06-21 ENCOUNTER — Telehealth: Payer: Self-pay | Admitting: Family Medicine

## 2017-06-21 ENCOUNTER — Other Ambulatory Visit: Payer: Self-pay | Admitting: Family Medicine

## 2017-06-21 NOTE — Telephone Encounter (Signed)
Pt wife walked in stating they have not received call from nurse. Went to Dr. Livia Snellen nurse she fixed letter for patient.

## 2017-06-25 ENCOUNTER — Other Ambulatory Visit: Payer: Self-pay | Admitting: Family Medicine

## 2017-06-25 ENCOUNTER — Ambulatory Visit: Payer: BLUE CROSS/BLUE SHIELD | Admitting: Family Medicine

## 2017-06-25 ENCOUNTER — Encounter: Payer: Self-pay | Admitting: Family Medicine

## 2017-06-25 VITALS — BP 130/90 | HR 88 | Ht 62.5 in | Wt 191.1 lb

## 2017-06-25 DIAGNOSIS — E782 Mixed hyperlipidemia: Secondary | ICD-10-CM | POA: Diagnosis not present

## 2017-06-25 DIAGNOSIS — R7989 Other specified abnormal findings of blood chemistry: Secondary | ICD-10-CM

## 2017-06-25 DIAGNOSIS — M545 Low back pain, unspecified: Secondary | ICD-10-CM

## 2017-06-25 DIAGNOSIS — I10 Essential (primary) hypertension: Secondary | ICD-10-CM

## 2017-06-25 MED ORDER — SILDENAFIL CITRATE 20 MG PO TABS: ORAL_TABLET | ORAL | 2 refills | Status: DC

## 2017-06-25 NOTE — Progress Notes (Signed)
Subjective:  Patient ID: Tyler Busing.,  male    DOB: June 11, 1955  Age: 62 y.o.    CC: Hypertension (pt here today for routine follow up of his chronic medical conditions)   HPI Tyler Pruitt. presents for  follow-up of hypertension. Patient has no history of headache chest pain or shortness of breath or recent cough. Patient also denies symptoms of TIA such as numbness weakness lateralizing. Patient denies side effects from medication. States taking it regularly.  Patient also  in for follow-up of elevated cholesterol. Doing well without complaints on current medication. Denies side effects  including myalgia and arthralgia and nausea. Also in today for liver function testing. Currently no chest pain, shortness of breath or other cardiovascular related symptoms noted.  Patient relates that he is concerned about his erectile dysfunction and is again interested in considering treatment.  He wants to know how to proceed.  He notes that his level was low in the past but borderline at last check.  History Tyler Pruitt has a past medical history of Anxiety, Fatty liver, GAD (generalized anxiety disorder), GERD (gastroesophageal reflux disease), Hyperlipidemia, Hypertension, IBS (irritable bowel syndrome), Low testosterone, Palpitations, Palpitations, and Vitamin D deficiency.   He has a past surgical history that includes None.   His family history includes Heart disease (age of onset: 44) in his father; Rheum arthritis in his mother.He reports that he quit smoking about 25 years ago. His smoking use included cigarettes. He has never used smokeless tobacco. He reports that he drinks about 1.2 - 1.8 oz of alcohol per week. He reports that he does not use drugs.  Current Outpatient Medications on File Prior to Visit  Medication Sig Dispense Refill  . aspirin 81 MG tablet Take 81 mg by mouth daily.    . cyclobenzaprine (FLEXERIL) 10 MG tablet TAKE 1 TABLET BY MOUTH THREE TIMES A DAY AS NEEDED FOR  MUSCLE SPASMS 90 tablet 1  . diclofenac (VOLTAREN) 75 MG EC tablet TAKE 1 TABLET BY MOUTH TWICE A DAY 60 tablet 3  . doxepin (SINEQUAN) 25 MG capsule TAKE 1 CAPSULE (25 MG TOTAL) BY MOUTH AT BEDTIME. 90 capsule 1  . fluticasone (FLONASE) 50 MCG/ACT nasal spray Place 2 sprays into both nostrils daily. 16 g 5  . hydrochlorothiazide (HYDRODIURIL) 25 MG tablet TAKE 1 TABLET BY MOUTH EVERY DAY 90 tablet 0  . lisinopril (PRINIVIL,ZESTRIL) 10 MG tablet Take 1 tablet (10 mg total) by mouth daily. 90 tablet 1  . predniSONE (DELTASONE) 10 MG tablet Take 5 PO x 3 days, then 4 PO x 3 days, then 3 PO x 3 days, then 2 PO x 3 days then 1 PO x 3 days. 45 tablet 0  . traMADol (ULTRAM) 50 MG tablet Take 1 tablet (50 mg total) by mouth every 6 (six) hours as needed. 60 tablet 0  . Vitamin D, Ergocalciferol, (DRISDOL) 50000 units CAPS capsule TAKE 1 CAPSULE (50,000 UNITS TOTAL) BY MOUTH 2 (TWO) TIMES A WEEK. 16 capsule 0   Current Facility-Administered Medications on File Prior to Visit  Medication Dose Route Frequency Provider Last Rate Last Dose  . 0.9 %  sodium chloride infusion  500 mL Intravenous Continuous Ladene Artist, MD        ROS Review of Systems  Constitutional: Negative.   HENT: Negative.   Eyes: Negative for visual disturbance.  Respiratory: Negative for cough and shortness of breath.   Cardiovascular: Negative for chest pain and leg swelling.  Gastrointestinal: Negative for abdominal pain, diarrhea, nausea and vomiting.  Genitourinary: Negative for difficulty urinating.  Musculoskeletal: Positive for back pain (Improving slowly). Negative for arthralgias and myalgias.  Skin: Negative for rash.  Neurological: Negative for headaches.  Psychiatric/Behavioral: Negative for sleep disturbance.    Objective:  BP 130/90   Pulse 88   Ht 5' 2.5" (1.588 m)   Wt 191 lb 2 oz (86.7 kg)   BMI 34.40 kg/m   BP Readings from Last 3 Encounters:  06/25/17 130/90  06/18/17 (!) 157/98  12/25/16  129/84    Wt Readings from Last 3 Encounters:  06/25/17 191 lb 2 oz (86.7 kg)  06/18/17 196 lb 2 oz (89 kg)  12/25/16 198 lb (89.8 kg)     Physical Exam  Constitutional: He is oriented to person, place, and time. No distress.  HENT:  Head: Normocephalic and atraumatic.  Right Ear: External ear normal.  Left Ear: External ear normal.  Nose: Nose normal.  Mouth/Throat: Oropharynx is clear and moist.  Eyes: Pupils are equal, round, and reactive to light. Conjunctivae and EOM are normal. No scleral icterus.  Neck: Normal range of motion. Neck supple. No JVD present. No tracheal deviation present. No thyromegaly present.  Cardiovascular: Normal rate, regular rhythm and normal heart sounds. Exam reveals no gallop and no friction rub.  No murmur heard. Pulmonary/Chest: Effort normal and breath sounds normal. No respiratory distress. He has no wheezes. He has no rales. He exhibits no tenderness.  Abdominal: Soft. Bowel sounds are normal. He exhibits no distension and no mass. There is no tenderness. There is no rebound and no guarding.  Musculoskeletal: Normal range of motion. He exhibits no edema or tenderness.  Lymphadenopathy:    He has no cervical adenopathy.  Neurological: He is alert and oriented to person, place, and time. He displays normal reflexes. No cranial nerve deficit. He exhibits normal muscle tone. Coordination normal.  Skin: Skin is warm and dry. No rash noted. He is not diaphoretic. No erythema.  Psychiatric: Judgment normal.  Vitals reviewed.   Diabetic Foot Exam - Simple   No data filed        Assessment & Plan:   Tyler Pruitt was seen today for hypertension.  Diagnoses and all orders for this visit:  Essential hypertension -     CMP14+EGFR  Low testosterone  Mixed hyperlipidemia  Lumbar back pain  Other orders -     sildenafil (REVATIO) 20 MG tablet; Take 2-5 tablets daily as needed for sex   I am having Tyler Busing. start on sildenafil. I am  also having him maintain his aspirin, fluticasone, traMADol, lisinopril, cyclobenzaprine, diclofenac, Vitamin D (Ergocalciferol), hydrochlorothiazide, predniSONE, doxepin, and omeprazole. We will continue to administer sodium chloride.  Meds ordered this encounter  Medications  . sildenafil (REVATIO) 20 MG tablet    Sig: Take 2-5 tablets daily as needed for sex    Dispense:  50 tablet    Refill:  2   I will go ahead with the sildenafil for now.  He is going to try to come back on 2 occasions in the near future before 10 AM to get his blood drawn for testosterone and free testosterone levels.  If both are in the low range will discuss testosterone supplementation including the pros and cons of the injectable and topical versions.  Follow-up: Return in about 6 months (around 12/25/2017).  Claretta Fraise, M.D.

## 2017-06-26 LAB — CMP14+EGFR
ALT: 129 IU/L — ABNORMAL HIGH (ref 0–44)
AST: 49 IU/L — ABNORMAL HIGH (ref 0–40)
Albumin/Globulin Ratio: 1.2 (ref 1.2–2.2)
Albumin: 4.4 g/dL (ref 3.5–5.5)
Alkaline Phosphatase: 92 IU/L (ref 39–117)
BUN/Creatinine Ratio: 18 (ref 9–20)
BUN: 24 mg/dL (ref 6–24)
Bilirubin Total: 0.4 mg/dL (ref 0.0–1.2)
CO2: 26 mmol/L (ref 20–29)
Calcium: 10 mg/dL (ref 8.7–10.2)
Chloride: 97 mmol/L (ref 96–106)
Creatinine, Ser: 1.32 mg/dL — ABNORMAL HIGH (ref 0.76–1.27)
GFR calc Af Amer: 68 mL/min/{1.73_m2} (ref >59)
GFR calc non Af Amer: 59 mL/min/{1.73_m2} — ABNORMAL LOW (ref >59)
Globulin, Total: 3.6 g/dL (ref 1.5–4.5)
Glucose: 101 mg/dL — ABNORMAL HIGH (ref 65–99)
Potassium: 3.9 mmol/L (ref 3.5–5.2)
Sodium: 141 mmol/L (ref 134–144)
Total Protein: 8 g/dL (ref 6.0–8.5)

## 2017-06-27 ENCOUNTER — Encounter: Payer: Self-pay | Admitting: Family Medicine

## 2017-06-27 DIAGNOSIS — M545 Low back pain, unspecified: Secondary | ICD-10-CM | POA: Insufficient documentation

## 2017-07-01 ENCOUNTER — Ambulatory Visit: Payer: BLUE CROSS/BLUE SHIELD | Admitting: Family Medicine

## 2017-07-04 ENCOUNTER — Other Ambulatory Visit: Payer: Self-pay | Admitting: Family Medicine

## 2017-07-04 DIAGNOSIS — I10 Essential (primary) hypertension: Secondary | ICD-10-CM

## 2017-07-13 ENCOUNTER — Other Ambulatory Visit: Payer: Self-pay | Admitting: Family Medicine

## 2017-07-17 ENCOUNTER — Other Ambulatory Visit: Payer: BLUE CROSS/BLUE SHIELD

## 2017-07-17 ENCOUNTER — Other Ambulatory Visit: Payer: Self-pay | Admitting: *Deleted

## 2017-07-17 DIAGNOSIS — R945 Abnormal results of liver function studies: Principal | ICD-10-CM

## 2017-07-17 DIAGNOSIS — R7989 Other specified abnormal findings of blood chemistry: Secondary | ICD-10-CM

## 2017-07-18 LAB — CMP14+EGFR
ALT: 65 IU/L — ABNORMAL HIGH (ref 0–44)
AST: 28 IU/L (ref 0–40)
Albumin/Globulin Ratio: 1.4 (ref 1.2–2.2)
Albumin: 4 g/dL (ref 3.5–5.5)
Alkaline Phosphatase: 71 IU/L (ref 39–117)
BUN/Creatinine Ratio: 14 (ref 9–20)
BUN: 15 mg/dL (ref 6–24)
Bilirubin Total: 0.3 mg/dL (ref 0.0–1.2)
CO2: 25 mmol/L (ref 20–29)
Calcium: 9.2 mg/dL (ref 8.7–10.2)
Chloride: 101 mmol/L (ref 96–106)
Creatinine, Ser: 1.06 mg/dL (ref 0.76–1.27)
GFR calc Af Amer: 88 mL/min/{1.73_m2} (ref >59)
GFR calc non Af Amer: 76 mL/min/{1.73_m2} (ref >59)
Globulin, Total: 2.9 g/dL (ref 1.5–4.5)
Glucose: 92 mg/dL (ref 65–99)
Potassium: 4.3 mmol/L (ref 3.5–5.2)
Sodium: 141 mmol/L (ref 134–144)
Total Protein: 6.9 g/dL (ref 6.0–8.5)

## 2017-08-26 ENCOUNTER — Other Ambulatory Visit: Payer: Self-pay | Admitting: Family Medicine

## 2017-09-14 ENCOUNTER — Other Ambulatory Visit: Payer: Self-pay | Admitting: Family Medicine

## 2017-10-29 ENCOUNTER — Other Ambulatory Visit: Payer: Self-pay | Admitting: Family Medicine

## 2017-10-29 NOTE — Telephone Encounter (Signed)
Last seen 06/25/17  Dr Livia Snellen

## 2017-11-28 ENCOUNTER — Other Ambulatory Visit: Payer: Self-pay | Admitting: Family Medicine

## 2017-11-30 NOTE — Telephone Encounter (Signed)
Last seen 06/25/17

## 2017-12-16 ENCOUNTER — Other Ambulatory Visit: Payer: Self-pay | Admitting: Family Medicine

## 2017-12-17 NOTE — Telephone Encounter (Signed)
OV 12/24/17

## 2017-12-24 ENCOUNTER — Encounter: Payer: BLUE CROSS/BLUE SHIELD | Admitting: Family Medicine

## 2018-01-09 ENCOUNTER — Other Ambulatory Visit: Payer: Self-pay | Admitting: Family Medicine

## 2018-01-09 DIAGNOSIS — I10 Essential (primary) hypertension: Secondary | ICD-10-CM

## 2018-01-10 NOTE — Telephone Encounter (Signed)
Last seen 06/25/17

## 2018-01-18 ENCOUNTER — Encounter: Payer: Self-pay | Admitting: Family Medicine

## 2018-01-18 ENCOUNTER — Ambulatory Visit: Payer: BLUE CROSS/BLUE SHIELD | Admitting: Family Medicine

## 2018-01-18 VITALS — BP 145/87 | HR 87 | Temp 97.9°F | Ht 62.0 in | Wt 191.6 lb

## 2018-01-18 DIAGNOSIS — E782 Mixed hyperlipidemia: Secondary | ICD-10-CM | POA: Diagnosis not present

## 2018-01-18 DIAGNOSIS — E559 Vitamin D deficiency, unspecified: Secondary | ICD-10-CM | POA: Diagnosis not present

## 2018-01-18 DIAGNOSIS — M25522 Pain in left elbow: Secondary | ICD-10-CM

## 2018-01-18 DIAGNOSIS — R7989 Other specified abnormal findings of blood chemistry: Secondary | ICD-10-CM

## 2018-01-18 DIAGNOSIS — Z0000 Encounter for general adult medical examination without abnormal findings: Secondary | ICD-10-CM | POA: Diagnosis not present

## 2018-01-18 DIAGNOSIS — M25521 Pain in right elbow: Secondary | ICD-10-CM

## 2018-01-18 DIAGNOSIS — E669 Obesity, unspecified: Secondary | ICD-10-CM

## 2018-01-18 DIAGNOSIS — I10 Essential (primary) hypertension: Secondary | ICD-10-CM | POA: Diagnosis not present

## 2018-01-18 DIAGNOSIS — Z23 Encounter for immunization: Secondary | ICD-10-CM | POA: Diagnosis not present

## 2018-01-18 DIAGNOSIS — M545 Low back pain, unspecified: Secondary | ICD-10-CM

## 2018-01-18 MED ORDER — DOXEPIN HCL 25 MG PO CAPS: 25.0000 mg | ORAL_CAPSULE | Freq: Every day | ORAL | 1 refills | Status: DC

## 2018-01-18 MED ORDER — OMEPRAZOLE 40 MG PO CPDR: 40.0000 mg | DELAYED_RELEASE_CAPSULE | Freq: Every day | ORAL | 1 refills | Status: DC

## 2018-01-18 MED ORDER — VITAMIN D (ERGOCALCIFEROL) 1.25 MG (50000 UNIT) PO CAPS: 50000.0000 [IU] | ORAL_CAPSULE | ORAL | 0 refills | Status: DC

## 2018-01-18 MED ORDER — CELECOXIB 200 MG PO CAPS: 200.0000 mg | ORAL_CAPSULE | Freq: Every day | ORAL | 5 refills | Status: DC

## 2018-01-18 MED ORDER — LISINOPRIL 10 MG PO TABS: 10.0000 mg | ORAL_TABLET | Freq: Every day | ORAL | 1 refills | Status: DC

## 2018-01-18 MED ORDER — CELECOXIB 200 MG PO CAPS: 200.0000 mg | ORAL_CAPSULE | Freq: Every day | ORAL | 2 refills | Status: DC

## 2018-01-18 MED ORDER — SILDENAFIL CITRATE 20 MG PO TABS: ORAL_TABLET | ORAL | 2 refills | Status: DC

## 2018-01-18 MED ORDER — FLUTICASONE PROPIONATE 50 MCG/ACT NA SUSP: 2.0000 | Freq: Every day | NASAL | 5 refills | Status: DC

## 2018-01-18 MED ORDER — HYDROCHLOROTHIAZIDE 25 MG PO TABS: 25.0000 mg | ORAL_TABLET | Freq: Every day | ORAL | 1 refills | Status: DC

## 2018-01-18 NOTE — Progress Notes (Signed)
Subjective:  Patient ID: Tyler Busing., male    DOB: November 30, 1955  Age: 62 y.o. MRN: [id number]  CC: Medical Management of Chronic Issues and Annual Exam   HPI Tyler Pruitt. presents for CPE.   Follow-up of hypertension. Patient has no history of headache chest pain or shortness of breath or recent cough. Patient also denies symptoms of TIA such as numbness weakness lateralizing. Patient checks  blood pressure at home and has not had any elevated readings recently. Patient denies side effects from his medication. States taking it regularly.  Patient in for follow-up of GERD. Currently asymptomatic taking  PPI as needed. There is no chest pain or heartburn. No hematemesis and no melena. No dysphagia or choking. Onset is remote. Progression is stable. Complicating factors, none.  Patient has not tried the viagra. he is worried about taking so many medicines.  He would like to adding this.  Patient says that the doxepin makes him somewhat groggy the following morning even though it does help with sleep..  For this reason he does not take it regularly.  Patient finished up his vitamin D.  He has not been using supplement since that time either.   Depression screen [facility] 2/9 01/18/2018 06/18/2017 12/25/2016  Decreased Interest 0 0 0  Down, Depressed, Hopeless 0 0 0  PHQ - 2 Score 0 0 0    History Tyler Pruitt has a past medical history of Anxiety, Fatty liver, GAD (generalized anxiety disorder), GERD (gastroesophageal reflux disease), Hyperlipidemia, Hypertension, IBS (irritable bowel syndrome), Low testosterone, Palpitations, Palpitations, and Vitamin D deficiency.   He has a past surgical history that includes None.   His family history includes Heart disease (age of onset: 37) in his father; Rheum arthritis in his mother.He reports that he quit smoking about 26 years ago. His smoking use included cigarettes. He has never used smokeless tobacco. He reports that he drinks about 2.0 - 3.0 standard  drinks of alcohol per week. He reports that he does not use drugs.    ROS Review of Systems  Constitutional: Negative.  Negative for activity change, fatigue and unexpected weight change.  HENT: Negative.  Negative for congestion, ear pain, hearing loss, postnasal drip and trouble swallowing.   Eyes: Negative for pain and visual disturbance.  Respiratory: Negative for cough, chest tightness and shortness of breath.   Cardiovascular: Negative for chest pain, palpitations and leg swelling.  Gastrointestinal: Negative for abdominal distention, abdominal pain, blood in stool, constipation, diarrhea, nausea and vomiting.  Endocrine: Negative for cold intolerance, heat intolerance and polydipsia.  Genitourinary: Negative for difficulty urinating, dysuria, flank pain, frequency and urgency.  Musculoskeletal: Positive for arthralgias (elbows) and back pain. Negative for joint swelling and myalgias.  Skin: Negative for color change, rash and wound.  Neurological: Negative for dizziness, syncope, speech difficulty, weakness, light-headedness, numbness and headaches.  Hematological: Does not bruise/bleed easily.  Psychiatric/Behavioral: Negative for confusion, decreased concentration, dysphoric mood and sleep disturbance. The patient is not nervous/anxious.     Objective:  BP (!) 145/87   Pulse 87   Temp 97.9 F (36.6 C) (Oral)   Ht 5' 2"  (1.575 m)   Wt 191 lb 9.6 oz (86.9 kg)   BMI 35.04 kg/m   BP Readings from Last 3 Encounters:  01/18/18 (!) 145/87  06/25/17 130/90  06/18/17 (!) 157/98    Wt Readings from Last 3 Encounters:  01/18/18 191 lb 9.6 oz (86.9 kg)  06/25/17 191 lb 2 oz (86.7 kg)  06/18/17 196 lb 2 oz (89 kg)     Physical Exam  Constitutional: He is oriented to person, place, and time. He appears well-developed and well-nourished.  HENT:  Head: Normocephalic and atraumatic.  Mouth/Throat: Oropharynx is clear and moist.  Eyes: Pupils are equal, round, and reactive to  light. EOM are normal.  Neck: Normal range of motion. No tracheal deviation present. No thyromegaly present.  Cardiovascular: Normal rate, regular rhythm and normal heart sounds. Exam reveals no gallop and no friction rub.  No murmur heard. Pulmonary/Chest: Breath sounds normal. He has no wheezes. He has no rales.  Abdominal: Soft. Bowel sounds are normal. He exhibits no distension and no mass. There is no tenderness. Hernia confirmed negative in the right inguinal area and confirmed negative in the left inguinal area.  Genitourinary: Testes normal and penis normal.  Musculoskeletal: Normal range of motion. He exhibits no edema.  Lymphadenopathy:    He has no cervical adenopathy.  Neurological: He is alert and oriented to person, place, and time.  Skin: Skin is warm and dry.  Psychiatric: He has a normal mood and affect.      Assessment & Plan:   Tyler Pruitt was seen today for medical management of chronic issues and annual exam.  Diagnoses and all orders for this visit:  Well adult exam -     CBC with Differential/Platelet -     CMP14+EGFR -     Lipid panel -     PSA, total and free -     Cancel: VITAMIN D 25 Hydroxy (Vit-D Deficiency, Fractures)  Essential hypertension -     CBC with Differential/Platelet -     CMP14+EGFR -     Lipid panel -     PSA, total and free -     Cancel: VITAMIN D 25 Hydroxy (Vit-D Deficiency, Fractures) -     lisinopril (PRINIVIL,ZESTRIL) 10 MG tablet; Take 1 tablet (10 mg total) by mouth daily.  Mixed hyperlipidemia -     CBC with Differential/Platelet -     CMP14+EGFR -     Lipid panel -     PSA, total and free -     Cancel: VITAMIN D 25 Hydroxy (Vit-D Deficiency, Fractures)  Low testosterone -     CBC with Differential/Platelet -     CMP14+EGFR -     Lipid panel -     PSA, total and free -     Cancel: VITAMIN D 25 Hydroxy (Vit-D Deficiency, Fractures)  Obesity (BMI 30.0-34.9) -     CBC with Differential/Platelet -     CMP14+EGFR -      Lipid panel -     PSA, total and free -     Cancel: VITAMIN D 25 Hydroxy (Vit-D Deficiency, Fractures)  Vitamin D deficiency -     VITAMIN D 25 Hydroxy (Vit-D Deficiency, Fractures) -     CBC with Differential/Platelet -     CMP14+EGFR -     Lipid panel -     PSA, total and free -     Cancel: VITAMIN D 25 Hydroxy (Vit-D Deficiency, Fractures)  Lumbar back pain -     CBC with Differential/Platelet -     CMP14+EGFR -     Lipid panel -     PSA, total and free -     Cancel: VITAMIN D 25 Hydroxy (Vit-D Deficiency, Fractures)  Arthralgia of both elbows -     CBC with Differential/Platelet -     CMP14+EGFR -  Lipid panel -     PSA, total and free -     Cancel: VITAMIN D 25 Hydroxy (Vit-D Deficiency, Fractures)  Need for immunization against influenza -     Flu Vaccine QUAD 36+ mos IM  Other orders -     Discontinue: celecoxib (CELEBREX) 200 MG capsule; Take 1 capsule (200 mg total) by mouth daily. With food for back and joint pain. -     celecoxib (CELEBREX) 200 MG capsule; Take 1 capsule (200 mg total) by mouth daily. With food for back and joint pain. -     doxepin (SINEQUAN) 25 MG capsule; Take 1 capsule (25 mg total) by mouth at bedtime. -     fluticasone (FLONASE) 50 MCG/ACT nasal spray; Place 2 sprays into both nostrils daily. -     hydrochlorothiazide (HYDRODIURIL) 25 MG tablet; Take 1 tablet (25 mg total) by mouth daily. -     omeprazole (PRILOSEC) 40 MG capsule; Take 1 capsule (40 mg total) by mouth daily. -     Vitamin D, Ergocalciferol, (DRISDOL) 50000 units CAPS capsule; Take 1 capsule (50,000 Units total) by mouth 2 (two) times a week. -     sildenafil (REVATIO) 20 MG tablet; Take 2-5 tablets daily as needed for sex       I have discontinued Tyler Cons Jr.'s traMADol, diclofenac, predniSONE, and cyclobenzaprine. I have also changed his hydrochlorothiazide, lisinopril, and omeprazole. Additionally, I am having him maintain his aspirin, celecoxib, doxepin,  fluticasone, Vitamin D (Ergocalciferol), and sildenafil. We will continue to administer sodium chloride.  Allergies as of 01/18/2018      Reactions   Pravastatin    Hurting "badly"      Medication List        Accurate as of 01/18/18  6:00 PM. Always use your most recent med list.          aspirin 81 MG tablet Take 81 mg by mouth daily.   celecoxib 200 MG capsule Commonly known as:  CELEBREX Take 1 capsule (200 mg total) by mouth daily. With food for back and joint pain.   doxepin 25 MG capsule Commonly known as:  SINEQUAN Take 1 capsule (25 mg total) by mouth at bedtime.   fluticasone 50 MCG/ACT nasal spray Commonly known as:  FLONASE Place 2 sprays into both nostrils daily.   hydrochlorothiazide 25 MG tablet Commonly known as:  HYDRODIURIL Take 1 tablet (25 mg total) by mouth daily.   lisinopril 10 MG tablet Commonly known as:  PRINIVIL,ZESTRIL Take 1 tablet (10 mg total) by mouth daily.   omeprazole 40 MG capsule Commonly known as:  PRILOSEC Take 1 capsule (40 mg total) by mouth daily.   sildenafil 20 MG tablet Commonly known as:  REVATIO Take 2-5 tablets daily as needed for sex   Vitamin D (Ergocalciferol) 50000 units Caps capsule Commonly known as:  DRISDOL Take 1 capsule (50,000 Units total) by mouth 2 (two) times a week. Start taking on:  01/20/2018      Back exercises BID  Follow-up: Return in about 6 months (around 07/19/2018).  Claretta Fraise, M.D.

## 2018-01-18 NOTE — Patient Instructions (Signed)

## 2018-01-19 LAB — CBC WITH DIFFERENTIAL/PLATELET
Basophils Absolute: 0.1 10*3/uL (ref 0.0–0.2)
Basos: 1 %
EOS (ABSOLUTE): 0.2 10*3/uL (ref 0.0–0.4)
Eos: 2 %
Hematocrit: 43.4 % (ref 37.5–51.0)
Hemoglobin: 14.6 g/dL (ref 13.0–17.7)
Immature Grans (Abs): 0 10*3/uL (ref 0.0–0.1)
Immature Granulocytes: 0 %
Lymphocytes Absolute: 2.3 10*3/uL (ref 0.7–3.1)
Lymphs: 25 %
MCH: 29.9 pg (ref 26.6–33.0)
MCHC: 33.6 g/dL (ref 31.5–35.7)
MCV: 89 fL (ref 79–97)
Monocytes Absolute: 0.7 10*3/uL (ref 0.1–0.9)
Monocytes: 7 %
Neutrophils Absolute: 6.1 10*3/uL (ref 1.4–7.0)
Neutrophils: 65 %
Platelets: 269 10*3/uL (ref 150–450)
RBC: 4.88 x10E6/uL (ref 4.14–5.80)
RDW: 12.9 % (ref 12.3–15.4)
WBC: 9.4 10*3/uL (ref 3.4–10.8)

## 2018-01-19 LAB — PSA, TOTAL AND FREE
PSA, Free Pct: 32.2 %
PSA, Free: 0.29 ng/mL
Prostate Specific Ag, Serum: 0.9 ng/mL (ref 0.0–4.0)

## 2018-01-19 LAB — LIPID PANEL
Chol/HDL Ratio: 4.1 ratio (ref 0.0–5.0)
Cholesterol, Total: 228 mg/dL — ABNORMAL HIGH (ref 100–199)
HDL: 56 mg/dL (ref >39)
LDL Calculated: 151 mg/dL — ABNORMAL HIGH (ref 0–99)
Triglycerides: 104 mg/dL (ref 0–149)
VLDL Cholesterol Cal: 21 mg/dL (ref 5–40)

## 2018-01-19 LAB — CMP14+EGFR
ALT: 70 IU/L — ABNORMAL HIGH (ref 0–44)
AST: 36 IU/L (ref 0–40)
Albumin/Globulin Ratio: 1.7 (ref 1.2–2.2)
Albumin: 4.8 g/dL (ref 3.6–4.8)
Alkaline Phosphatase: 78 IU/L (ref 39–117)
BUN/Creatinine Ratio: 16 (ref 10–24)
BUN: 16 mg/dL (ref 8–27)
Bilirubin Total: 0.3 mg/dL (ref 0.0–1.2)
CO2: 24 mmol/L (ref 20–29)
Calcium: 9.6 mg/dL (ref 8.6–10.2)
Chloride: 100 mmol/L (ref 96–106)
Creatinine, Ser: 1 mg/dL (ref 0.76–1.27)
GFR calc Af Amer: 94 mL/min/{1.73_m2} (ref >59)
GFR calc non Af Amer: 81 mL/min/{1.73_m2} (ref >59)
Globulin, Total: 2.8 g/dL (ref 1.5–4.5)
Glucose: 96 mg/dL (ref 65–99)
Potassium: 3.9 mmol/L (ref 3.5–5.2)
Sodium: 142 mmol/L (ref 134–144)
Total Protein: 7.6 g/dL (ref 6.0–8.5)

## 2018-01-19 LAB — VITAMIN D 25 HYDROXY (VIT D DEFICIENCY, FRACTURES): Vit D, 25-Hydroxy: 28.1 ng/mL — ABNORMAL LOW (ref 30.0–100.0)

## 2018-07-19 ENCOUNTER — Other Ambulatory Visit: Payer: Self-pay

## 2018-07-19 ENCOUNTER — Encounter: Payer: Self-pay | Admitting: Family Medicine

## 2018-07-19 ENCOUNTER — Ambulatory Visit (INDEPENDENT_AMBULATORY_CARE_PROVIDER_SITE_OTHER): Payer: BLUE CROSS/BLUE SHIELD | Admitting: Family Medicine

## 2018-07-19 DIAGNOSIS — K219 Gastro-esophageal reflux disease without esophagitis: Secondary | ICD-10-CM

## 2018-07-19 DIAGNOSIS — I10 Essential (primary) hypertension: Secondary | ICD-10-CM | POA: Diagnosis not present

## 2018-07-19 DIAGNOSIS — M545 Low back pain, unspecified: Secondary | ICD-10-CM

## 2018-07-19 DIAGNOSIS — E669 Obesity, unspecified: Secondary | ICD-10-CM

## 2018-07-19 MED ORDER — DOXEPIN HCL 25 MG PO CAPS: 25.0000 mg | ORAL_CAPSULE | Freq: Every day | ORAL | 1 refills | Status: DC

## 2018-07-19 MED ORDER — CYCLOBENZAPRINE HCL 10 MG PO TABS: 10.0000 mg | ORAL_TABLET | Freq: Three times a day (TID) | ORAL | 0 refills | Status: DC | PRN

## 2018-07-19 MED ORDER — OMEPRAZOLE 40 MG PO CPDR: 40.0000 mg | DELAYED_RELEASE_CAPSULE | Freq: Every day | ORAL | 1 refills | Status: DC

## 2018-07-19 MED ORDER — LISINOPRIL 10 MG PO TABS: 10.0000 mg | ORAL_TABLET | Freq: Every day | ORAL | 1 refills | Status: DC

## 2018-07-19 MED ORDER — HYDROCHLOROTHIAZIDE 25 MG PO TABS: 25.0000 mg | ORAL_TABLET | Freq: Every day | ORAL | 1 refills | Status: DC

## 2018-07-19 NOTE — Progress Notes (Signed)
Subjective:    Patient ID: Tyler Busing., male    DOB: [birth year], 63 y.o.   MRN: [id number]   HPI: Tyler Pruitt. is a 63 y.o. male presenting for follow-up of hypertension. Patient has no history of headache chest pain or shortness of breath or recent cough. Patient also denies symptoms of TIA such as numbness weakness lateralizing. Patient checks  blood pressure at home and has not had any elevated readings recently. Patient denies side effects from his medication. States taking it regularly.BP at home 129/75 yesterday. Occasionally a little higher.Up to 140/75.    Patient in for follow-up of GERD. Currently asymptomatic taking omeprazole as needed. There is no chest pain or heartburn. No hematemesis and no melena. No dysphagia or choking. Onset is remote. Progression is stable. Complicating factors, none.    Depression screen [facility] 2/9 01/18/2018 06/18/2017 12/25/2016 12/18/2016 03/13/2016  Decreased Interest 0 0 0 0 0  Down, Depressed, Hopeless 0 0 0 0 0  PHQ - 2 Score 0 0 0 0 0     Relevant past medical, surgical, family and social history reviewed and updated as indicated.  Interim medical history since our last visit reviewed. Allergies and medications reviewed and updated.  ROS:  Review of Systems  Constitutional: Negative.   HENT: Negative.   Eyes: Negative for visual disturbance.  Respiratory: Negative for cough and shortness of breath.   Cardiovascular: Negative for chest pain and leg swelling.  Gastrointestinal: Negative for abdominal pain, diarrhea, nausea and vomiting.  Genitourinary: Negative for difficulty urinating.  Musculoskeletal: Positive for arthralgias and back pain. Negative for myalgias.  Skin: Negative for rash.  Neurological: Negative for headaches.  Psychiatric/Behavioral: Negative for sleep disturbance.     Social History   Tobacco Use  Smoking Status Former Smoker  . Types: Cigarettes  . Last attempt to quit: 12/22/1991  . Years since  quitting: 26.5  Smokeless Tobacco Never Used  Tobacco Comment   Smoked few cigarettes in the past       Objective:     Wt Readings from Last 3 Encounters:  01/18/18 191 lb 9.6 oz (86.9 kg)  06/25/17 191 lb 2 oz (86.7 kg)  06/18/17 196 lb 2 oz (89 kg)     Exam deferred. Pt. Harboring due to COVID 19. Phone visit performed.   Assessment & Plan:   1. Lumbar back pain   2. Essential hypertension   3. Obesity (BMI 30.0-34.9)   4. Gastroesophageal reflux disease without esophagitis     Meds ordered this encounter  Medications  . lisinopril (ZESTRIL) 10 MG tablet    Sig: Take 1 tablet (10 mg total) by mouth daily.    Dispense:  90 tablet    Refill:  1  . hydrochlorothiazide (HYDRODIURIL) 25 MG tablet    Sig: Take 1 tablet (25 mg total) by mouth daily.    Dispense:  90 tablet    Refill:  1  . doxepin (SINEQUAN) 25 MG capsule    Sig: Take 1 capsule (25 mg total) by mouth at bedtime.    Dispense:  90 capsule    Refill:  1  . omeprazole (PRILOSEC) 40 MG capsule    Sig: Take 1 capsule (40 mg total) by mouth daily.    Dispense:  90 capsule    Refill:  1  . cyclobenzaprine (FLEXERIL) 10 MG tablet    Sig: Take 1 tablet (10 mg total) by mouth 3 (three) times daily as needed for  muscle spasms.    Dispense:  90 tablet    Refill:  0    No orders of the defined types were placed in this encounter.     Diagnoses and all orders for this visit:  Lumbar back pain  Essential hypertension -     lisinopril (ZESTRIL) 10 MG tablet; Take 1 tablet (10 mg total) by mouth daily.  Obesity (BMI 30.0-34.9)  Gastroesophageal reflux disease without esophagitis  Other orders -     hydrochlorothiazide (HYDRODIURIL) 25 MG tablet; Take 1 tablet (25 mg total) by mouth daily. -     doxepin (SINEQUAN) 25 MG capsule; Take 1 capsule (25 mg total) by mouth at bedtime. -     omeprazole (PRILOSEC) 40 MG capsule; Take 1 capsule (40 mg total) by mouth daily. -     cyclobenzaprine (FLEXERIL) 10 MG  tablet; Take 1 tablet (10 mg total) by mouth 3 (three) times daily as needed for muscle spasms.    Virtual Visit via telephone Note  I discussed the limitations, risks, security and privacy concerns of performing an evaluation and management service by telephone and the availability of in person appointments. The patient was identified with two identifiers. Pt.expressed understanding and agreed to proceed. Pt. Is at home. Dr. Livia Snellen is in his office.  Follow Up Instructions:   I discussed the assessment and treatment plan with the patient. The patient was provided an opportunity to ask questions and all were answered. The patient agreed with the plan and demonstrated an understanding of the instructions.   The patient was advised to call back or seek an in-person evaluation if the symptoms worsen or if the condition fails to improve as anticipated.   Total minutes including chart review and phone contact time: 25   Follow up plan: Return in about 6 months (around 01/19/2019) for Wellness.  Claretta Fraise, MD [place]

## 2018-08-22 ENCOUNTER — Other Ambulatory Visit: Payer: Self-pay | Admitting: Family Medicine

## 2019-01-14 ENCOUNTER — Other Ambulatory Visit: Payer: Self-pay | Admitting: Family Medicine

## 2019-01-14 DIAGNOSIS — I10 Essential (primary) hypertension: Secondary | ICD-10-CM

## 2019-01-30 ENCOUNTER — Other Ambulatory Visit: Payer: Self-pay | Admitting: Family Medicine

## 2019-01-30 DIAGNOSIS — I10 Essential (primary) hypertension: Secondary | ICD-10-CM

## 2019-04-12 ENCOUNTER — Other Ambulatory Visit: Payer: Self-pay | Admitting: Family Medicine

## 2019-04-12 ENCOUNTER — Encounter: Payer: Self-pay | Admitting: Family Medicine

## 2019-04-13 ENCOUNTER — Telehealth: Payer: Self-pay | Admitting: Family Medicine

## 2019-04-13 NOTE — Telephone Encounter (Signed)
Are you ok with a TELE visit for chronic care on this pt at 500 pm 1/29? If you prefer to see him in office - or do before 500 pm - let us know and we will call them. Thanks!

## 2019-04-14 ENCOUNTER — Encounter: Payer: Self-pay | Admitting: Family Medicine

## 2019-04-14 ENCOUNTER — Ambulatory Visit (INDEPENDENT_AMBULATORY_CARE_PROVIDER_SITE_OTHER): Payer: Managed Care, Other (non HMO) | Admitting: Family Medicine

## 2019-04-14 DIAGNOSIS — F5101 Primary insomnia: Secondary | ICD-10-CM

## 2019-04-14 DIAGNOSIS — M545 Low back pain, unspecified: Secondary | ICD-10-CM

## 2019-04-14 DIAGNOSIS — I10 Essential (primary) hypertension: Secondary | ICD-10-CM

## 2019-04-14 DIAGNOSIS — G8929 Other chronic pain: Secondary | ICD-10-CM

## 2019-04-14 MED ORDER — HYDROCHLOROTHIAZIDE 25 MG PO TABS: 25.0000 mg | ORAL_TABLET | Freq: Every day | ORAL | 1 refills | Status: DC

## 2019-04-14 MED ORDER — CYCLOBENZAPRINE HCL 10 MG PO TABS: 10.0000 mg | ORAL_TABLET | Freq: Three times a day (TID) | ORAL | 0 refills | Status: DC | PRN

## 2019-04-14 MED ORDER — DOXEPIN HCL 25 MG PO CAPS: 25.0000 mg | ORAL_CAPSULE | Freq: Every day | ORAL | 0 refills | Status: DC

## 2019-04-14 MED ORDER — LISINOPRIL 10 MG PO TABS: 10.0000 mg | ORAL_TABLET | Freq: Every day | ORAL | 1 refills | Status: DC

## 2019-04-14 NOTE — Telephone Encounter (Signed)
Wife aware of visit by tele this evening.

## 2019-04-14 NOTE — Telephone Encounter (Signed)
It would be best if I could see him in person - when the lab is pen. Will accommodate whatever needs to be done, though. WS

## 2019-04-14 NOTE — Telephone Encounter (Signed)
I'll call him today at 5:45, if he can be put on my schedule, thanks, WS

## 2019-04-14 NOTE — Telephone Encounter (Signed)
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 He cannot even talk on the phone until around 5:45. He is out of BP meds. Please advise

## 2019-04-14 NOTE — Progress Notes (Signed)
Subjective:    Patient ID: Tyler Pruitt., male    DOB: 1955-10-02, 64 y.o.   MRN: [id number]   HPI: Tyler Pruitt. is a 64 y.o. male presenting for follow up of routine medical issues. presents for  follow-up of hypertension. Patient has no history of headache chest pain or shortness of breath or recent cough. Patient also denies symptoms of TIA such as focal numbness or weakness. Patient denies side effects from medication. States taking it regularly.145/80 most recently.  Pt. States he is working 12 hour days. So exhausted he doesn't have to use the d0xepin every night.  However it is there when he needs it.  Problems with pain over grogginess the next day.  Patient has occasional back pain.  This is been exacerbated by the long hours.   Depression screen [facility] 2/9 01/18/2018 06/18/2017 12/25/2016 12/18/2016 03/13/2016  Decreased Interest 0 0 0 0 0  Down, Depressed, Hopeless 0 0 0 0 0  PHQ - 2 Score 0 0 0 0 0     Relevant past medical, surgical, family and social history reviewed and updated as indicated.  Interim medical history since our last visit reviewed. Allergies and medications reviewed and updated.  ROS:  Review of Systems  Constitutional: Negative for fever.  Respiratory: Negative for shortness of breath.   Cardiovascular: Negative for chest pain, palpitations and leg swelling.  Musculoskeletal: Positive for back pain. Negative for arthralgias.  Skin: Negative for rash.     Social History   Tobacco Use  Smoking Status Former Smoker  . Types: Cigarettes  . Quit date: 12/22/1991  . Years since quitting: 27.3  Smokeless Tobacco Never Used  Tobacco Comment   Smoked few cigarettes in the past       Objective:     Wt Readings from Last 3 Encounters:  01/18/18 191 lb 9.6 oz (86.9 kg)  06/25/17 191 lb 2 oz (86.7 kg)  06/18/17 196 lb 2 oz (89 kg)     Exam deferred. Pt. Harboring due to COVID 19. Phone visit performed.   Assessment & Plan:   1.  Primary insomnia   2. Essential hypertension   3. Chronic midline low back pain without sciatica     Meds ordered this encounter  Medications  . doxepin (SINEQUAN) 25 MG capsule    Sig: Take 1 capsule (25 mg total) by mouth at bedtime.    Dispense:  30 capsule    Refill:  0    (Needs to be seen before next refill)  . cyclobenzaprine (FLEXERIL) 10 MG tablet    Sig: Take 1 tablet (10 mg total) by mouth 3 (three) times daily as needed for muscle spasms.    Dispense:  90 tablet    Refill:  0  . hydrochlorothiazide (HYDRODIURIL) 25 MG tablet    Sig: Take 1 tablet (25 mg total) by mouth daily.    Dispense:  90 tablet    Refill:  1  . lisinopril (ZESTRIL) 10 MG tablet    Sig: Take 1 tablet (10 mg total) by mouth daily. (Needs to be seen before next refill)    Dispense:  90 tablet    Refill:  1    No orders of the defined types were placed in this encounter.     Diagnoses and all orders for this visit:  Primary insomnia -     doxepin (SINEQUAN) 25 MG capsule; Take 1 capsule (25 mg total) by mouth at bedtime.  Essential hypertension -     hydrochlorothiazide (HYDRODIURIL) 25 MG tablet; Take 1 tablet (25 mg total) by mouth daily. -     lisinopril (ZESTRIL) 10 MG tablet; Take 1 tablet (10 mg total) by mouth daily. (Needs to be seen before next refill)  Chronic midline low back pain without sciatica -     cyclobenzaprine (FLEXERIL) 10 MG tablet; Take 1 tablet (10 mg total) by mouth 3 (three) times daily as needed for muscle spasms.  Patient's problem seems to be stable at this time.  As best can be evaluated by phone.  He is still getting treated for insomnia although he is working so hard that he is exhausted and does not always need it.  He however counterproductive and that he is not getting exercise or losing weight as this in turn may be impacting his blood pressure because the only reading he can get me is definitely a [place] over the border.  I encouraged him to cut back on salt  consumption to as little as possible.    Virtual Visit via telephone Note  I discussed the limitations, risks, security and privacy concerns of performing an evaluation and management service by telephone and the availability of in person appointments. The patient was identified with two identifiers. Pt.expressed understanding and agreed to proceed. Pt. Is at home. Dr. Livia Snellen is in his office.  Follow Up Instructions:   I discussed the assessment and treatment plan with the patient. The patient was provided an opportunity to ask questions and all were answered. The patient agreed with the plan and demonstrated an understanding of the instructions.   The patient was advised to call back or seek an in-person evaluation if the symptoms worsen or if the condition fails to improve as anticipated.   Total minutes including chart review and phone contact time: 26   Follow up plan: No follow-ups on file.  Claretta Fraise, MD [place]

## 2019-04-14 NOTE — Telephone Encounter (Signed)
Wife aware that pt will be called at 5:45 today

## 2019-05-20 IMAGING — DX DG LUMBAR SPINE 2-3V
2 series · 2 of 2 positions shown · non-contrast
Comparison: None.

CLINICAL DATA: Low back pain

EXAM:
LUMBAR SPINE - 2-3 VIEW

[l-spine ap]
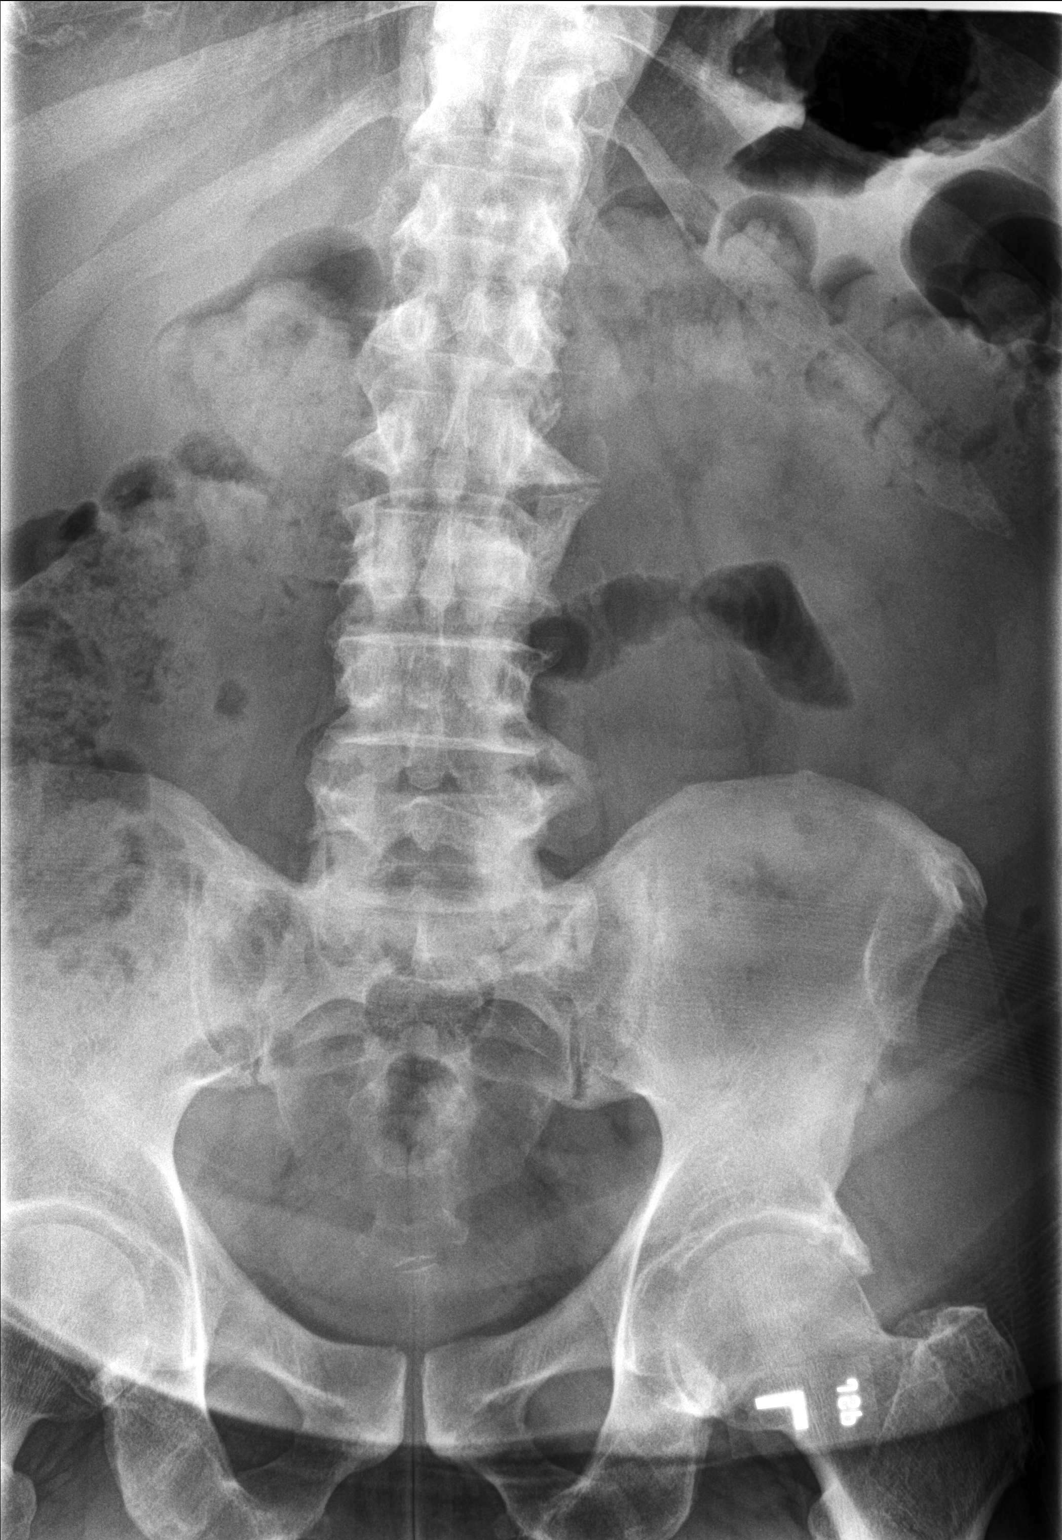

[l-spine lat]
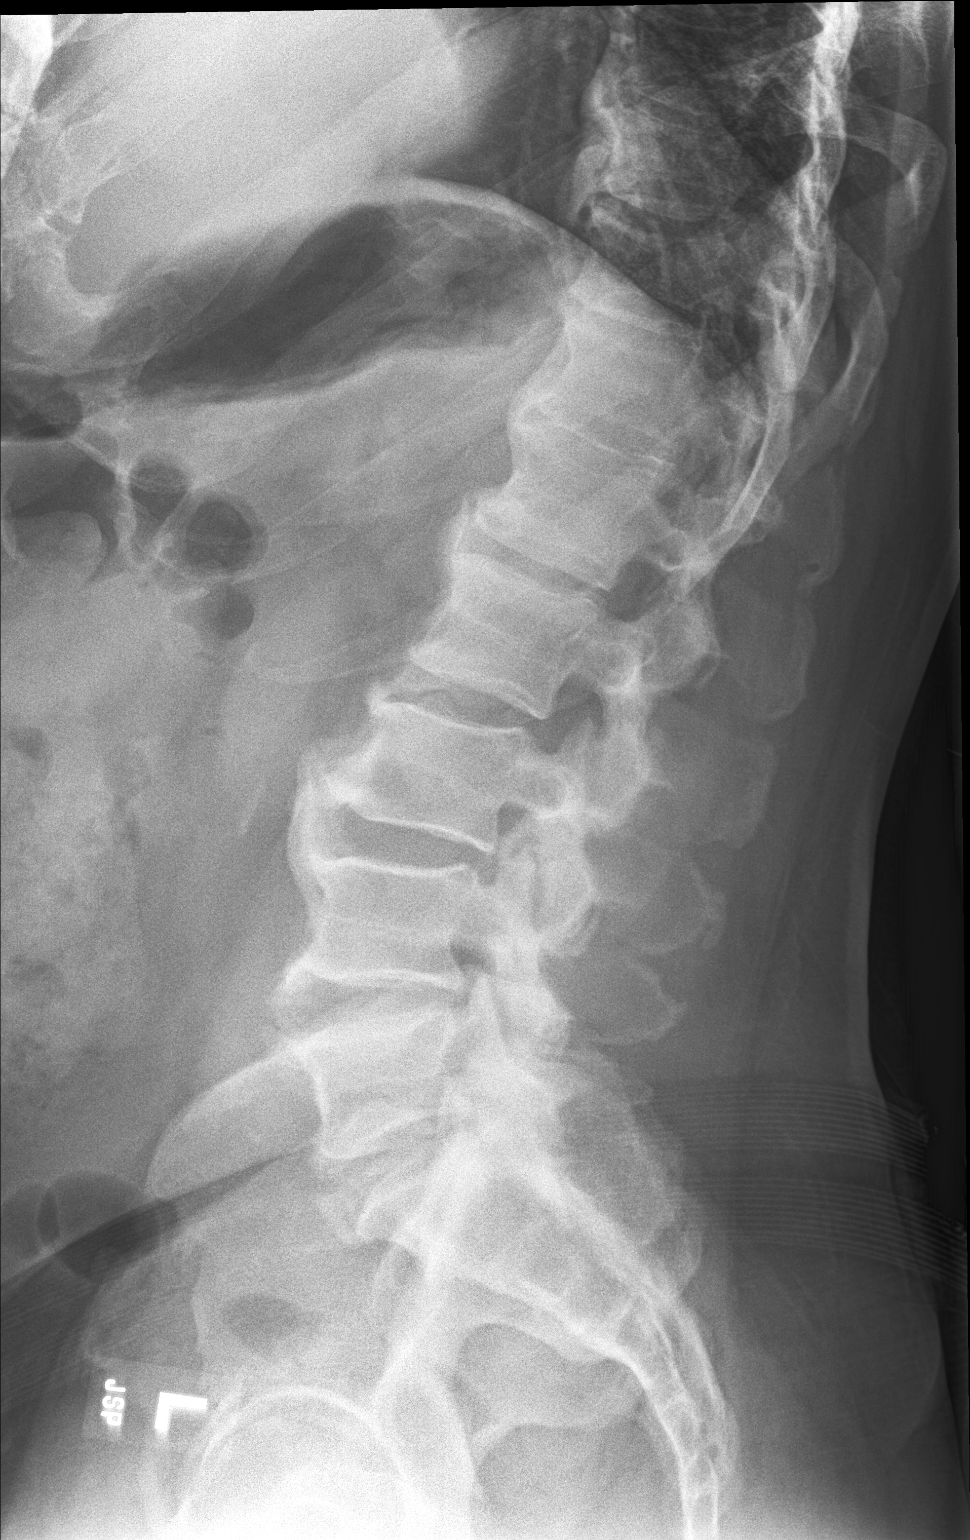

[2 of 2 positions shown; findings below may reference images not displayed]

FINDINGS: Normal lumbar lordosis and segmentation. Osteophytes are seen along
the anterior aspect of the lumbar vertebrae. No significant disc
flattening. No pars defects or listhesis.
IMPRESSION: No acute osseous abnormality. No significant disc flattening or pars
defects. Osteophytes are noted along the course of the lumbar spine.

## 2019-07-13 ENCOUNTER — Other Ambulatory Visit: Payer: Self-pay | Admitting: Family Medicine

## 2019-07-13 DIAGNOSIS — F5101 Primary insomnia: Secondary | ICD-10-CM

## 2019-08-08 ENCOUNTER — Other Ambulatory Visit: Payer: Self-pay | Admitting: Family Medicine

## 2019-08-08 DIAGNOSIS — F5101 Primary insomnia: Secondary | ICD-10-CM

## 2019-08-08 NOTE — Telephone Encounter (Signed)
Last office visit 04/14/2019 No upcoming appointments Last refill 07/14/2019,  #30, no refills

## 2019-10-05 ENCOUNTER — Other Ambulatory Visit: Payer: Self-pay | Admitting: Family Medicine

## 2019-10-05 DIAGNOSIS — I10 Essential (primary) hypertension: Secondary | ICD-10-CM

## 2019-10-05 NOTE — Telephone Encounter (Signed)
30 day supply given today  ntbs for further refills

## 2019-10-29 ENCOUNTER — Other Ambulatory Visit: Payer: Self-pay | Admitting: Family Medicine

## 2019-10-29 DIAGNOSIS — I10 Essential (primary) hypertension: Secondary | ICD-10-CM

## 2020-02-07 ENCOUNTER — Other Ambulatory Visit: Payer: Self-pay | Admitting: Family Medicine

## 2020-02-07 DIAGNOSIS — F5101 Primary insomnia: Secondary | ICD-10-CM

## 2020-02-15 ENCOUNTER — Other Ambulatory Visit: Payer: Self-pay | Admitting: Family Medicine

## 2020-02-15 ENCOUNTER — Other Ambulatory Visit: Payer: Self-pay

## 2020-02-15 DIAGNOSIS — F5101 Primary insomnia: Secondary | ICD-10-CM

## 2020-02-15 DIAGNOSIS — I10 Essential (primary) hypertension: Secondary | ICD-10-CM

## 2020-02-15 MED ORDER — DOXEPIN HCL 25 MG PO CAPS: 25.0000 mg | ORAL_CAPSULE | Freq: Every day | ORAL | 0 refills | Status: DC

## 2020-02-15 MED ORDER — LISINOPRIL 10 MG PO TABS: 10.0000 mg | ORAL_TABLET | Freq: Every day | ORAL | 0 refills | Status: DC

## 2020-02-15 MED ORDER — HYDROCHLOROTHIAZIDE 25 MG PO TABS: 25.0000 mg | ORAL_TABLET | Freq: Every day | ORAL | 0 refills | Status: DC

## 2020-02-15 NOTE — Telephone Encounter (Signed)
  Prescription Request  02/15/2020  What is the name of the medication or equipment? Pt needs blood pressure meds and doxepin. He has appt in [place].  Have you contacted your pharmacy to request a refill? (if applicable) yes  Which pharmacy would you like this sent to? [facility]   Patient notified that their request is being sent to the clinical staff for review and that they should receive a response within 2 business days.

## 2020-02-15 NOTE — Telephone Encounter (Signed)
Aware 30 day refill sent to pharmacy

## 2020-03-11 ENCOUNTER — Other Ambulatory Visit: Payer: Self-pay | Admitting: Family Medicine

## 2020-03-11 DIAGNOSIS — I10 Essential (primary) hypertension: Secondary | ICD-10-CM

## 2020-03-20 ENCOUNTER — Other Ambulatory Visit: Payer: Self-pay

## 2020-03-20 ENCOUNTER — Ambulatory Visit (INDEPENDENT_AMBULATORY_CARE_PROVIDER_SITE_OTHER): Payer: Managed Care, Other (non HMO)

## 2020-03-20 ENCOUNTER — Encounter: Payer: Self-pay | Admitting: Family Medicine

## 2020-03-20 ENCOUNTER — Ambulatory Visit: Payer: Managed Care, Other (non HMO)

## 2020-03-20 ENCOUNTER — Other Ambulatory Visit: Payer: Self-pay | Admitting: Family Medicine

## 2020-03-20 ENCOUNTER — Ambulatory Visit (INDEPENDENT_AMBULATORY_CARE_PROVIDER_SITE_OTHER): Payer: Managed Care, Other (non HMO) | Admitting: Family Medicine

## 2020-03-20 VITALS — BP 135/83 | HR 80 | Temp 97.8°F | Ht 62.0 in | Wt 200.2 lb

## 2020-03-20 DIAGNOSIS — M8949 Other hypertrophic osteoarthropathy, multiple sites: Secondary | ICD-10-CM | POA: Diagnosis not present

## 2020-03-20 DIAGNOSIS — F5101 Primary insomnia: Secondary | ICD-10-CM

## 2020-03-20 DIAGNOSIS — M159 Polyosteoarthritis, unspecified: Secondary | ICD-10-CM

## 2020-03-20 DIAGNOSIS — Z0001 Encounter for general adult medical examination with abnormal findings: Secondary | ICD-10-CM

## 2020-03-20 DIAGNOSIS — E559 Vitamin D deficiency, unspecified: Secondary | ICD-10-CM

## 2020-03-20 DIAGNOSIS — Z0000 Encounter for general adult medical examination without abnormal findings: Secondary | ICD-10-CM

## 2020-03-20 DIAGNOSIS — M545 Low back pain, unspecified: Secondary | ICD-10-CM

## 2020-03-20 DIAGNOSIS — K219 Gastro-esophageal reflux disease without esophagitis: Secondary | ICD-10-CM

## 2020-03-20 DIAGNOSIS — M5416 Radiculopathy, lumbar region: Secondary | ICD-10-CM

## 2020-03-20 DIAGNOSIS — I10 Essential (primary) hypertension: Secondary | ICD-10-CM

## 2020-03-20 DIAGNOSIS — Z23 Encounter for immunization: Secondary | ICD-10-CM | POA: Diagnosis not present

## 2020-03-20 DIAGNOSIS — Z125 Encounter for screening for malignant neoplasm of prostate: Secondary | ICD-10-CM

## 2020-03-20 LAB — URINALYSIS
Bilirubin, UA: NEGATIVE
Glucose, UA: NEGATIVE
Ketones, UA: NEGATIVE
Leukocytes,UA: NEGATIVE
Nitrite, UA: NEGATIVE
Protein,UA: NEGATIVE
RBC, UA: NEGATIVE
Specific Gravity, UA: 1.02 (ref 1.005–1.030)
Urobilinogen, Ur: 0.2 mg/dL (ref 0.2–1.0)
pH, UA: 5.5 (ref 5.0–7.5)

## 2020-03-20 MED ORDER — PREDNISONE 10 MG PO TABS: ORAL_TABLET | ORAL | 0 refills | Status: DC

## 2020-03-20 MED ORDER — DICLOFENAC SODIUM 75 MG PO TBEC: 75.0000 mg | DELAYED_RELEASE_TABLET | Freq: Two times a day (BID) | ORAL | 2 refills | Status: DC

## 2020-03-20 MED ORDER — PANTOPRAZOLE SODIUM 40 MG PO TBEC: 40.0000 mg | DELAYED_RELEASE_TABLET | Freq: Every day | ORAL | 11 refills | Status: DC

## 2020-03-20 NOTE — Progress Notes (Signed)
Subjective:  Patient ID: Tyler Pruitt., male    DOB: 1955-12-26  Age: 65 y.o. MRN: [id number]  CC: Annual Exam   HPI Tyler Pruitt. presents for  Complete physical.  presents for  follow-up of hypertension. Patient has no history of headache chest pain or shortness of breath or recent cough. Patient also denies symptoms of TIA such as focal numbness or weakness. Patient denies side effects from medication. States taking it regularly.Tyler Pruitt is concerned about his back.  He has chronic low back pain which is exacerbated by his busy work schedule.  He is working 10 hours a day six days a week.        Depression screen [facility] 2/9 01/18/2018 06/18/2017 12/25/2016  Decreased Interest 0 0 0  Down, Depressed, Hopeless 0 0 0  PHQ - 2 Score 0 0 0    History Tyler Pruitt has a past medical history of Anxiety, Fatty liver, GAD (generalized anxiety disorder), GERD (gastroesophageal reflux disease), Hyperlipidemia, Hypertension, IBS (irritable bowel syndrome), Low testosterone, Palpitations, Palpitations, and Vitamin D deficiency.   He has a past surgical history that includes None.   His family history includes Heart disease (age of onset: 26) in his father; Rheum arthritis in his mother.He reports that he quit smoking about 28 years ago. His smoking use included cigarettes. He has never used smokeless tobacco. He reports current alcohol use of about 2.0 - 3.0 standard drinks of alcohol per week. He reports that he does not use drugs.    ROS Review of Systems  Constitutional: Negative for activity change, fatigue and unexpected weight change.  HENT: Negative for congestion, ear pain, hearing loss, postnasal drip and trouble swallowing.   Eyes: Negative for pain and visual disturbance.  Respiratory: Negative for cough, chest tightness and shortness of breath.   Cardiovascular: Negative for chest pain, palpitations and leg swelling.  Gastrointestinal: Positive for abdominal pain (burning with  NSAID, celebrex). Negative for abdominal distention, blood in stool, constipation, diarrhea, nausea and vomiting.  Endocrine: Negative for cold intolerance, heat intolerance and polydipsia.  Genitourinary: Negative for difficulty urinating, dysuria, flank pain, frequency and urgency.  Musculoskeletal: Positive for arthralgias and back pain. Negative for joint swelling.  Skin: Negative for color change, rash and wound.  Neurological: Negative for dizziness, syncope, speech difficulty, weakness, light-headedness, numbness and headaches.  Hematological: Does not bruise/bleed easily.  Psychiatric/Behavioral: Negative for confusion, decreased concentration, dysphoric mood and sleep disturbance. The patient is not nervous/anxious.     Objective:  BP 135/83   Pulse 80   Temp 97.8 F (36.6 C) (Temporal)   Ht 5' 2"  (1.575 m)   Wt 200 lb 3.2 oz (90.8 kg)   BMI 36.62 kg/m   BP Readings from Last 3 Encounters:  03/20/20 135/83  01/18/18 (!) 145/87  06/25/17 130/90    Wt Readings from Last 3 Encounters:  03/20/20 200 lb 3.2 oz (90.8 kg)  01/18/18 191 lb 9.6 oz (86.9 kg)  06/25/17 191 lb 2 oz (86.7 kg)     Physical Exam Constitutional:      Appearance: He is well-developed and well-nourished.  HENT:     Head: Normocephalic and atraumatic.     Mouth/Throat:     Mouth: Oropharynx is clear and moist.  Eyes:     Extraocular Movements: EOM normal.     Pupils: Pupils are equal, round, and reactive to light.  Neck:     Thyroid: No thyromegaly.     Trachea: No tracheal deviation.  Cardiovascular:     Rate and Rhythm: Normal rate and regular rhythm.     Heart sounds: Normal heart sounds. No murmur heard. No friction rub. No gallop.   Pulmonary:     Breath sounds: Normal breath sounds. No wheezing or rales.  Abdominal:     General: Bowel sounds are normal. There is no distension.     Palpations: Abdomen is soft. There is no mass.     Tenderness: There is no abdominal tenderness.      Hernia: There is no hernia in the right inguinal area or left inguinal area.  Genitourinary:    Penis: Normal.      Testes: Normal.  Musculoskeletal:        General: Tenderness ([name] positive SLR) present. No edema. Normal range of motion.     Cervical back: Normal range of motion.     Right lower leg: No edema.     Left lower leg: No edema.  Lymphadenopathy:     Cervical: No cervical adenopathy.  Skin:    General: Skin is warm and dry.  Neurological:     Mental Status: He is alert and oriented to person, place, and time.  Psychiatric:        Mood and Affect: Mood and affect normal.       Assessment & Plan:   Tyler Pruitt was seen today for annual exam.  Diagnoses and all orders for this visit:  Well adult exam -     CBC with Differential/Platelet -     CMP14+EGFR -     Lipid panel  Primary osteoarthritis involving multiple joints -     CBC with Differential/Platelet -     CMP14+EGFR -     Lipid panel -     DG Shoulder Left; Future -     DG Shoulder Right; Future -     DG Lumbar Spine 2-3 Views; Future -     DG Knee 1-2 Views Left; Future -     DG Knee 1-2 Views Right; Future -     Rheumatoid factor -     ANA,IFA RA Diag Pnl w/rflx Tit/Patn  Chronic left-sided lumbar radiculopathy -     CBC with Differential/Platelet -     CMP14+EGFR -     Lipid panel -     DG Lumbar Spine 2-3 Views; Future  Lumbar back pain -     CBC with Differential/Platelet -     CMP14+EGFR -     Lipid panel -     DG Lumbar Spine 2-3 Views; Future -     Rheumatoid factor -     ANA,IFA RA Diag Pnl w/rflx Tit/Patn  Essential hypertension -     CBC with Differential/Platelet -     CMP14+EGFR -     Lipid panel -     Urinalysis  Primary insomnia -     CBC with Differential/Platelet -     CMP14+EGFR -     Lipid panel  Gastroesophageal reflux disease without esophagitis -     CBC with Differential/Platelet -     CMP14+EGFR -     Lipid panel  Vitamin D deficiency -     CBC with  Differential/Platelet -     CMP14+EGFR -     Lipid panel -     VITAMIN D 25 Hydroxy (Vit-D Deficiency, Fractures)  Screening for prostate cancer -     CBC with Differential/Platelet -     CMP14+EGFR -  Lipid panel -     PSA, total and free  Need for immunization against influenza -     Flu Vaccine QUAD 36+ mos IM  Other orders -     pantoprazole (PROTONIX) 40 MG tablet; Take 1 tablet (40 mg total) by mouth daily. For stomach -     diclofenac (VOLTAREN) 75 MG EC tablet; Take 1 tablet (75 mg total) by mouth 2 (two) times daily. For muscle and  Joint pain -     predniSONE (DELTASONE) 10 MG tablet; Take 5 daily for 3 days followed by 4,3,2 and 1 for 3 days each.    Patient declined to go to physical therapy.  This is due to his arduous work schedule of 610-hour days a week.  He just cannot get off.  However I gave him a printout on his AVS of lumbar spine exercises and he is going to work on that twice a day.  We discussed the importance for him of weight loss.  This should help him with his back his arthritis in general and his blood pressure.   I am having Tyler Pruitt. start on pantoprazole, diclofenac, and predniSONE. I am also having him maintain his aspirin, sildenafil, cyclobenzaprine, doxepin, hydrochlorothiazide, and lisinopril. We will continue to administer sodium chloride.  Allergies as of 03/20/2020      Reactions   Pravastatin    Hurting "badly"      Medication List       Accurate as of March 20, 2020 11:54 AM. If you have any questions, ask your nurse or doctor.        aspirin 81 MG tablet Take 81 mg by mouth daily.   cyclobenzaprine 10 MG tablet Commonly known as: FLEXERIL Take 1 tablet (10 mg total) by mouth 3 (three) times daily as needed for muscle spasms.   diclofenac 75 MG EC tablet Commonly known as: VOLTAREN Take 1 tablet (75 mg total) by mouth 2 (two) times daily. For muscle and  Joint pain Started by: Claretta Fraise, MD   doxepin 25 MG  capsule Commonly known as: SINEQUAN Take 1 capsule (25 mg total) by mouth at bedtime.   hydrochlorothiazide 25 MG tablet Commonly known as: HYDRODIURIL TAKE 1 TABLET BY MOUTH EVERY DAY   lisinopril 10 MG tablet Commonly known as: ZESTRIL TAKE 1 TABLET BY MOUTH EVERY DAY   pantoprazole 40 MG tablet Commonly known as: PROTONIX Take 1 tablet (40 mg total) by mouth daily. For stomach Started by: Claretta Fraise, MD   predniSONE 10 MG tablet Commonly known as: DELTASONE Take 5 daily for 3 days followed by 4,3,2 and 1 for 3 days each. Started by: Claretta Fraise, MD   sildenafil 20 MG tablet Commonly known as: REVATIO Take 2-5 tablets daily as needed for sex        Follow-up: Return in about 6 weeks (around 05/01/2020).  Claretta Fraise, M.D.

## 2020-03-20 NOTE — Patient Instructions (Addendum)
Start taking the prednisone 5 a day for 3 days followed by 4-day for 3 days and then 3 a day for 3 days.  When you get down to 2 a day and 1 a day 3 days each, you can add in the diclofenac.  Diclofenac will be taken routinely twice a day with food.  These are meant to help with both your back and your other joints including her knees shoulders etc. . To protect your stomach from the potential burning when taking the diclofenac or prednisone, you should start taking the pantoprazole once a day on an empty stomach.  Start that right away.   Back Exercises The following exercises strengthen the muscles that help to support the trunk and back. They also help to keep the lower back flexible. Doing these exercises can help to prevent back pain or lessen existing pain.  If you have back pain or discomfort, try doing these exercises 2-3 times each day or as told by your health care provider.  As your pain improves, do them once each day, but increase the number of times that you repeat the steps for each exercise (do more repetitions).  To prevent the recurrence of back pain, continue to do these exercises once each day or as told by your health care provider. Do exercises exactly as told by your health care provider and adjust them as directed. It is normal to feel mild stretching, pulling, tightness, or discomfort as you do these exercises, but you should stop right away if you feel sudden pain or your pain gets worse. Exercises Single knee to chest Repeat these steps 3-5 times for each leg: 1. Lie on your back on a firm bed or the floor with your legs extended. 2. Bring one knee to your chest. Your other leg should stay extended and in contact with the floor. 3. Hold your knee in place by grabbing your knee or thigh with both hands and hold. 4. Pull on your knee until you feel a gentle stretch in your lower back or buttocks. 5. Hold the stretch for 10-30 seconds. 6. Slowly release and straighten  your leg. Pelvic tilt Repeat these steps 5-10 times: 1. Lie on your back on a firm bed or the floor with your legs extended. 2. Bend your knees so they are pointing toward the ceiling and your feet are flat on the floor. 3. Tighten your lower abdominal muscles to press your lower back against the floor. This motion will tilt your pelvis so your tailbone points up toward the ceiling instead of pointing to your feet or the floor. 4. With gentle tension and even breathing, hold this position for 5-10 seconds. Cat-cow Repeat these steps until your lower back becomes more flexible: 1. Get into a hands-and-knees position on a firm surface. Keep your hands under your shoulders, and keep your knees under your hips. You may place padding under your knees for comfort. 2. Let your head hang down toward your chest. Contract your abdominal muscles and point your tailbone toward the floor so your lower back becomes rounded like the back of a cat. 3. Hold this position for 5 seconds. 4. Slowly lift your head, let your abdominal muscles relax and point your tailbone up toward the ceiling so your back forms a sagging arch like the back of a cow. 5. Hold this position for 5 seconds.  Press-ups Repeat these steps 5-10 times: 1. Lie on your abdomen (face-down) on the floor. 2. Place your palms  near your head, about shoulder-width apart. 3. Keeping your back as relaxed as possible and keeping your hips on the floor, slowly straighten your arms to raise the top half of your body and lift your shoulders. Do not use your back muscles to raise your upper torso. You may adjust the placement of your hands to make yourself more comfortable. 4. Hold this position for 5 seconds while you keep your back relaxed. 5. Slowly return to lying flat on the floor.  Bridges Repeat these steps 10 times: 1. Lie on your back on a firm surface. 2. Bend your knees so they are pointing toward the ceiling and your feet are flat on the  floor. Your arms should be flat at your sides, next to your body. 3. Tighten your buttocks muscles and lift your buttocks off the floor until your waist is at almost the same height as your knees. You should feel the muscles working in your buttocks and the back of your thighs. If you do not feel these muscles, slide your feet 1-2 inches farther away from your buttocks. 4. Hold this position for 3-5 seconds. 5. Slowly lower your hips to the starting position, and allow your buttocks muscles to relax completely. If this exercise is too easy, try doing it with your arms crossed over your chest. Abdominal crunches Repeat these steps 5-10 times: 1. Lie on your back on a firm bed or the floor with your legs extended. 2. Bend your knees so they are pointing toward the ceiling and your feet are flat on the floor. 3. Cross your arms over your chest. 4. Tip your chin slightly toward your chest without bending your neck. 5. Tighten your abdominal muscles and slowly raise your trunk (torso) high enough to lift your shoulder blades a tiny bit off the floor. Avoid raising your torso higher than that because it can put too much stress on your low back and does not help to strengthen your abdominal muscles. 6. Slowly return to your starting position. Back lifts Repeat these steps 5-10 times: 1. Lie on your abdomen (face-down) with your arms at your sides, and rest your forehead on the floor. 2. Tighten the muscles in your legs and your buttocks. 3. Slowly lift your chest off the floor while you keep your hips pressed to the floor. Keep the back of your head in line with the curve in your back. Your eyes should be looking at the floor. 4. Hold this position for 3-5 seconds. 5. Slowly return to your starting position. Contact a health care provider if:  Your back pain or discomfort gets much worse when you do an exercise.  Your worsening back pain or discomfort does not lessen within 2 hours after you  exercise. If you have any of these problems, stop doing these exercises right away. Do not do them again unless your health care provider says that you can. Get help right away if:  You develop sudden, severe back pain. If this happens, stop doing the exercises right away. Do not do them again unless your health care provider says that you can. This information is not intended to replace advice given to you by your health care provider. Make sure you discuss any questions you have with your health care provider. Document Revised: 07/07/2018 Document Reviewed: 12/02/2017 Elsevier Patient Education  2020 [employer].

## 2020-03-21 ENCOUNTER — Telehealth: Payer: Self-pay | Admitting: Family Medicine

## 2020-03-21 LAB — CBC WITH DIFFERENTIAL/PLATELET
Basophils Absolute: 0 10*3/uL (ref 0.0–0.2)
Basos: 1 %
EOS (ABSOLUTE): 0.1 10*3/uL (ref 0.0–0.4)
Eos: 2 %
Hematocrit: 47.1 % (ref 37.5–51.0)
Hemoglobin: 15.4 g/dL (ref 13.0–17.7)
Immature Grans (Abs): 0 10*3/uL (ref 0.0–0.1)
Immature Granulocytes: 1 %
Lymphocytes Absolute: 1.6 10*3/uL (ref 0.7–3.1)
Lymphs: 25 %
MCH: 29.5 pg (ref 26.6–33.0)
MCHC: 32.7 g/dL (ref 31.5–35.7)
MCV: 90 fL (ref 79–97)
Monocytes Absolute: 0.6 10*3/uL (ref 0.1–0.9)
Monocytes: 9 %
Neutrophils Absolute: 3.9 10*3/uL (ref 1.4–7.0)
Neutrophils: 62 %
Platelets: 268 10*3/uL (ref 150–450)
RBC: 5.22 x10E6/uL (ref 4.14–5.80)
RDW: 12.7 % (ref 11.6–15.4)
WBC: 6.3 10*3/uL (ref 3.4–10.8)

## 2020-03-21 LAB — LIPID PANEL
Chol/HDL Ratio: 5.1 ratio — ABNORMAL HIGH (ref 0.0–5.0)
Cholesterol, Total: 221 mg/dL — ABNORMAL HIGH (ref 100–199)
HDL: 43 mg/dL (ref >39)
LDL Chol Calc (NIH): 155 mg/dL — ABNORMAL HIGH (ref 0–99)
Triglycerides: 129 mg/dL (ref 0–149)
VLDL Cholesterol Cal: 23 mg/dL (ref 5–40)

## 2020-03-21 LAB — PSA, TOTAL AND FREE
PSA, Free Pct: 26.4 %
PSA, Free: 0.37 ng/mL
Prostate Specific Ag, Serum: 1.4 ng/mL (ref 0.0–4.0)

## 2020-03-21 LAB — CMP14+EGFR
ALT: 63 IU/L — ABNORMAL HIGH (ref 0–44)
AST: 35 IU/L (ref 0–40)
Albumin/Globulin Ratio: 1.6 (ref 1.2–2.2)
Albumin: 4.6 g/dL (ref 3.8–4.8)
Alkaline Phosphatase: 82 IU/L (ref 44–121)
BUN/Creatinine Ratio: 15 (ref 10–24)
BUN: 19 mg/dL (ref 8–27)
Bilirubin Total: 0.4 mg/dL (ref 0.0–1.2)
CO2: 28 mmol/L (ref 20–29)
Calcium: 9.7 mg/dL (ref 8.6–10.2)
Chloride: 96 mmol/L (ref 96–106)
Creatinine, Ser: 1.26 mg/dL (ref 0.76–1.27)
GFR calc Af Amer: 70 mL/min/{1.73_m2} (ref >59)
GFR calc non Af Amer: 61 mL/min/{1.73_m2} (ref >59)
Globulin, Total: 2.9 g/dL (ref 1.5–4.5)
Glucose: 113 mg/dL — ABNORMAL HIGH (ref 65–99)
Potassium: 3.8 mmol/L (ref 3.5–5.2)
Sodium: 138 mmol/L (ref 134–144)
Total Protein: 7.5 g/dL (ref 6.0–8.5)

## 2020-03-21 LAB — VITAMIN D 25 HYDROXY (VIT D DEFICIENCY, FRACTURES): Vit D, 25-Hydroxy: 22.4 ng/mL — ABNORMAL LOW (ref 30.0–100.0)

## 2020-03-21 LAB — ANA,IFA RA DIAG PNL W/RFLX TIT/PATN
ANA Titer 1: NEGATIVE
Cyclic Citrullin Peptide Ab: 5 units (ref 0–19)
Rheumatoid fact SerPl-aCnc: <10 IU/mL (ref <14.0)

## 2020-03-21 NOTE — Telephone Encounter (Signed)
Pt's wife stated that they wanted a message left on VM when calling for pt's lab results

## 2020-03-22 ENCOUNTER — Other Ambulatory Visit: Payer: Self-pay | Admitting: Family Medicine

## 2020-03-22 MED ORDER — ROSUVASTATIN CALCIUM 5 MG PO TABS: 5.0000 mg | ORAL_TABLET | Freq: Every day | ORAL | 3 refills | Status: DC

## 2020-03-22 NOTE — Telephone Encounter (Signed)
Will leave VM

## 2020-04-04 ENCOUNTER — Other Ambulatory Visit: Payer: Self-pay | Admitting: Family Medicine

## 2020-04-04 DIAGNOSIS — I10 Essential (primary) hypertension: Secondary | ICD-10-CM

## 2020-04-27 ENCOUNTER — Other Ambulatory Visit: Payer: Self-pay | Admitting: Family Medicine

## 2020-04-27 DIAGNOSIS — I10 Essential (primary) hypertension: Secondary | ICD-10-CM

## 2020-05-01 ENCOUNTER — Encounter: Payer: Self-pay | Admitting: Family Medicine

## 2020-05-01 ENCOUNTER — Ambulatory Visit: Payer: Managed Care, Other (non HMO) | Admitting: Family Medicine

## 2020-05-01 ENCOUNTER — Other Ambulatory Visit: Payer: Self-pay

## 2020-05-01 VITALS — BP 129/81 | HR 93 | Temp 97.1°F | Resp 20 | Ht 62.0 in | Wt 198.4 lb

## 2020-05-01 DIAGNOSIS — M545 Low back pain, unspecified: Secondary | ICD-10-CM

## 2020-05-01 DIAGNOSIS — I10 Essential (primary) hypertension: Secondary | ICD-10-CM

## 2020-05-01 NOTE — Progress Notes (Signed)
Subjective:  Patient ID: Tyler Pruitt., male    DOB: 12-22-1955  Age: 65 y.o. MRN: [id number]  CC: 6 week follow up back pain   HPI Tyler Pruitt. presents for recheck of his acute on chronic low back pain.  He was given a steroid pack, lumbar exercise regimen and diclofenac plus the PPI Protonix.  He states he did not get the low back pain exercises she when he was here before because of all of the x-rays and labs that he had to have done.  He would like to have it set.  He still has a very strenuous work schedule.  He is unwilling to take time off for physical therapy.  He says the steroid Dosepak helped a lot until he got down to the 2 pill a day left.  The diclofenac helped some but even with the PPI Protonix he had some GI distress so he had to cut it back to 1 a day.  He says that he at about the same time started working through lunch so he could get off earlier.  He is can quit doing that so he can eat lunch and see if by doing that he can tolerate the twice a day dose.  If not he will let me know.  Pain is still moderately severe it is in the L3-5 region midline with some radiation intermittently to the posterior left thigh.  Depression screen [facility] 2/9 05/01/2020 01/18/2018 06/18/2017  Decreased Interest 0 0 0  Down, Depressed, Hopeless 0 0 0  PHQ - 2 Score 0 0 0    History Joffrey has a past medical history of Anxiety, Fatty liver, GAD (generalized anxiety disorder), GERD (gastroesophageal reflux disease), Hyperlipidemia, Hypertension, IBS (irritable bowel syndrome), Low testosterone, Palpitations, Palpitations, and Vitamin D deficiency.   He has a past surgical history that includes None.   His family history includes Heart disease (age of onset: 90) in his father; Rheum arthritis in his mother.He reports that he quit smoking about 28 years ago. His smoking use included cigarettes. He has never used smokeless tobacco. He reports current alcohol use of about 2.0 - 3.0 standard drinks  of alcohol per week. He reports that he does not use drugs.    ROS Review of Systems  Constitutional: Negative for fever.  Respiratory: Negative for shortness of breath.   Cardiovascular: Negative for chest pain.  Musculoskeletal: Negative for arthralgias.  Skin: Negative for rash.    Objective:  BP 129/81   Pulse 93   Temp (!) 97.1 F (36.2 C) (Temporal)   Resp 20   Ht 5\' 2"  (1.575 m)   Wt 198 lb 6 oz (90 kg)   SpO2 97%   BMI 36.28 kg/m   BP Readings from Last 3 Encounters:  05/01/20 129/81  03/20/20 135/83  01/18/18 (!) 145/87    Wt Readings from Last 3 Encounters:  05/01/20 198 lb 6 oz (90 kg)  03/20/20 200 lb 3.2 oz (90.8 kg)  01/18/18 191 lb 9.6 oz (86.9 kg)     Physical Exam Vitals reviewed.  Constitutional:      Appearance: He is well-developed and well-nourished.  HENT:     Head: Normocephalic and atraumatic.     Right Ear: External ear normal.     Left Ear: External ear normal.     Mouth/Throat:     Pharynx: No oropharyngeal exudate or posterior oropharyngeal erythema.  Eyes:     Pupils: Pupils are equal, round,  and reactive to light.  Cardiovascular:     Rate and Rhythm: Normal rate and regular rhythm.     Heart sounds: No murmur heard.   Pulmonary:     Effort: No respiratory distress.     Breath sounds: Normal breath sounds.  Musculoskeletal:        General: Tenderness (L3-5 midline) present. No swelling. Normal range of motion.     Cervical back: Normal range of motion and neck supple.  Neurological:     Mental Status: He is alert and oriented to person, place, and time.       Assessment & Plan:   Mcadoo was seen today for 6 week follow up back pain.  Diagnoses and all orders for this visit:  Lumbar back pain  Primary hypertension       I have discontinued Real Cons Jr.'s predniSONE. I am also having him maintain his aspirin, sildenafil, cyclobenzaprine, hydrochlorothiazide, pantoprazole, diclofenac, doxepin,  rosuvastatin, and lisinopril. We will continue to administer sodium chloride.  Allergies as of 05/01/2020      Reactions   Pravastatin    Hurting "badly"      Medication List       Accurate as of May 01, 2020  9:22 PM. If you have any questions, ask your nurse or doctor.        STOP taking these medications   predniSONE 10 MG tablet Commonly known as: DELTASONE Stopped by: Claretta Fraise, MD     TAKE these medications   aspirin 81 MG tablet Take 81 mg by mouth daily.   cyclobenzaprine 10 MG tablet Commonly known as: FLEXERIL Take 1 tablet (10 mg total) by mouth 3 (three) times daily as needed for muscle spasms.   diclofenac 75 MG EC tablet Commonly known as: VOLTAREN Take 1 tablet (75 mg total) by mouth 2 (two) times daily. For muscle and  Joint pain   doxepin 25 MG capsule Commonly known as: SINEQUAN TAKE 1 CAPSULE (25 MG TOTAL) BY MOUTH AT BEDTIME.   hydrochlorothiazide 25 MG tablet Commonly known as: HYDRODIURIL TAKE 1 TABLET BY MOUTH EVERY DAY   lisinopril 10 MG tablet Commonly known as: ZESTRIL TAKE 1 TABLET BY MOUTH EVERY DAY   pantoprazole 40 MG tablet Commonly known as: PROTONIX Take 1 tablet (40 mg total) by mouth daily. For stomach   rosuvastatin 5 MG tablet Commonly known as: Crestor Take 1 tablet (5 mg total) by mouth daily. For cholesterol   sildenafil 20 MG tablet Commonly known as: REVATIO Take 2-5 tablets daily as needed for sex       I personally hands and the lumbar back pain exercise handout.  Recommended that he go through the exercises twice a day.  He should continue the diclofenac twice daily if possible.  Definitely he should continue the Protonix.  Should he have heartburn with this we will consider changing him over to Relafen or Celebrex.   Follow-up: Return in about 3 months (around 07/29/2020).  I requested that he return in 6 weeks.  However his work schedule will not allow him to come in sooner than 3 months.  He does not  want to take time off for doctor's visit.  Claretta Fraise, M.D.

## 2020-05-01 NOTE — Patient Instructions (Signed)

## 2020-05-08 ENCOUNTER — Other Ambulatory Visit: Payer: Self-pay

## 2020-05-08 ENCOUNTER — Ambulatory Visit (INDEPENDENT_AMBULATORY_CARE_PROVIDER_SITE_OTHER): Payer: Managed Care, Other (non HMO)

## 2020-05-08 DIAGNOSIS — Z23 Encounter for immunization: Secondary | ICD-10-CM | POA: Diagnosis not present

## 2020-05-08 NOTE — Progress Notes (Signed)
   [id number]  Name:  Tyler Pruitt.    MRN: [id number] DOB: [birth year]  05/08/2020  [name] was observed post Covid-19 immunization for 15 minutes without incident. He was provided with Vaccine Information Sheet and instruction to access the V-Safe system.   Tyler Pruitt was instructed to call 911 with any severe reactions post vaccine: Marland Kitchen Difficulty breathing  . Swelling of face and throat  . A fast heartbeat  . A bad rash all over body  . Dizziness and weakness   Immunizations Administered    Name Date Dose VIS Date Route   Moderna Covid-19 Booster Vaccine 05/08/2020  3:15 PM 0.25 mL 01/03/2020 Intramuscular   Manufacturer: Moderna   Lot: [id number]   [place]: 60479-987-21

## 2020-05-28 ENCOUNTER — Other Ambulatory Visit: Payer: Self-pay | Admitting: Family Medicine

## 2020-06-15 ENCOUNTER — Other Ambulatory Visit: Payer: Self-pay | Admitting: Family Medicine

## 2020-06-18 ENCOUNTER — Other Ambulatory Visit: Payer: Self-pay | Admitting: Family Medicine

## 2020-06-18 DIAGNOSIS — I10 Essential (primary) hypertension: Secondary | ICD-10-CM

## 2020-07-11 ENCOUNTER — Other Ambulatory Visit: Payer: Self-pay | Admitting: Family Medicine

## 2020-07-11 DIAGNOSIS — I10 Essential (primary) hypertension: Secondary | ICD-10-CM

## 2020-07-30 ENCOUNTER — Ambulatory Visit: Payer: Managed Care, Other (non HMO) | Admitting: Family Medicine

## 2020-07-30 ENCOUNTER — Other Ambulatory Visit: Payer: Self-pay

## 2020-07-30 ENCOUNTER — Encounter: Payer: Self-pay | Admitting: Family Medicine

## 2020-07-30 DIAGNOSIS — G8929 Other chronic pain: Secondary | ICD-10-CM | POA: Diagnosis not present

## 2020-07-30 DIAGNOSIS — F5101 Primary insomnia: Secondary | ICD-10-CM | POA: Diagnosis not present

## 2020-07-30 DIAGNOSIS — I10 Essential (primary) hypertension: Secondary | ICD-10-CM | POA: Diagnosis not present

## 2020-07-30 DIAGNOSIS — M545 Low back pain, unspecified: Secondary | ICD-10-CM | POA: Diagnosis not present

## 2020-07-30 MED ORDER — OMEPRAZOLE 40 MG PO CPDR: 40.0000 mg | DELAYED_RELEASE_CAPSULE | Freq: Every day | ORAL | 3 refills | Status: DC

## 2020-07-30 MED ORDER — HYDROCHLOROTHIAZIDE 25 MG PO TABS: 1.0000 | ORAL_TABLET | Freq: Every day | ORAL | 1 refills | Status: DC

## 2020-07-30 MED ORDER — DOXEPIN HCL 25 MG PO CAPS: 25.0000 mg | ORAL_CAPSULE | Freq: Every day | ORAL | 3 refills | Status: DC

## 2020-07-30 MED ORDER — LISINOPRIL 10 MG PO TABS: 1.0000 | ORAL_TABLET | Freq: Every day | ORAL | 1 refills | Status: DC

## 2020-07-30 MED ORDER — ROSUVASTATIN CALCIUM 5 MG PO TABS
5.0000 mg | ORAL_TABLET | Freq: Every day | ORAL | 3 refills | Status: DC
Start: 2020-07-30 — End: 2021-07-29

## 2020-07-30 MED ORDER — METRONIDAZOLE 1 % EX GEL: Freq: Every day | CUTANEOUS | 11 refills | Status: DC

## 2020-07-30 MED ORDER — CYCLOBENZAPRINE HCL 10 MG PO TABS
10.0000 mg | ORAL_TABLET | Freq: Three times a day (TID) | ORAL | 1 refills | Status: DC | PRN
Start: 2020-07-30 — End: 2020-10-01

## 2020-07-30 NOTE — Progress Notes (Signed)
Subjective:  Patient ID: Tyler Busing., male    DOB: 1955-10-06  Age: 65 y.o. MRN: [id number]  CC: Medical Management of Chronic Issues   HPI Tyler Hamre. presents for burning in stomach from diclofenac. Now face is staying red. Taking two diclofenac is to strong.. Makes face red. Still some heartburn. Taking pantoprazole daily. Work has decreased to 8 hour days. Not exercising. Too tired at the end of the day. Hopefully decreased work schedule will help him to get more exercise.  Depression screen [facility] 2/9 07/30/2020 07/30/2020 05/01/2020  Decreased Interest 0 0 0  Down, Depressed, Hopeless 0 0 0  PHQ - 2 Score 0 0 0  Altered sleeping 0 - -  Tired, decreased energy 2 - -  Change in appetite 2 - -  Feeling bad or failure about yourself  0 - -  Trouble concentrating 0 - -  Moving slowly or fidgety/restless 0 - -  Suicidal thoughts 0 - -  PHQ-9 Score 4 - -  Difficult doing work/chores Not difficult at all - -    History Tyler Pruitt has a past medical history of Anxiety, Fatty liver, GAD (generalized anxiety disorder), GERD (gastroesophageal reflux disease), Hyperlipidemia, Hypertension, IBS (irritable bowel syndrome), Low testosterone, Palpitations, Palpitations, and Vitamin D deficiency.   He has a past surgical history that includes None.   His family history includes Heart disease (age of onset: 22) in his father; Rheum arthritis in his mother.He reports that he quit smoking about 28 years ago. His smoking use included cigarettes. He has never used smokeless tobacco. He reports current alcohol use of about 2.0 - 3.0 standard drinks of alcohol per week. He reports that he does not use drugs.    ROS Review of Systems  Constitutional: Negative for fever.  Respiratory: Negative for shortness of breath.   Cardiovascular: Negative for chest pain.  Gastrointestinal: Positive for abdominal pain (mild, persistent heartburn).  Musculoskeletal: Positive for arthralgias.  Skin:  Positive for rash (faciallerythema).    Objective:  BP 126/72   Pulse 96   Temp 98.9 F (37.2 C)   Ht 5\' 2"  (1.575 m)   Wt 205 lb 3.2 oz (93.1 kg)   SpO2 97%   BMI 37.53 kg/m   BP Readings from Last 3 Encounters:  07/30/20 126/72  05/01/20 129/81  03/20/20 135/83    Wt Readings from Last 3 Encounters:  07/30/20 205 lb 3.2 oz (93.1 kg)  05/01/20 198 lb 6 oz (90 kg)  03/20/20 200 lb 3.2 oz (90.8 kg)     Physical Exam    Assessment & Plan:   Naithen was seen today for medical management of chronic issues.  Diagnoses and all orders for this visit:  Chronic midline low back pain without sciatica -     cyclobenzaprine (FLEXERIL) 10 MG tablet; Take 1 tablet (10 mg total) by mouth 3 (three) times daily as needed for muscle spasms.  Primary insomnia -     doxepin (SINEQUAN) 25 MG capsule; Take 1 capsule (25 mg total) by mouth at bedtime.  Essential hypertension -     hydrochlorothiazide (HYDRODIURIL) 25 MG tablet; Take 1 tablet (25 mg total) by mouth daily. -     lisinopril (ZESTRIL) 10 MG tablet; Take 1 tablet (10 mg total) by mouth daily.  Other orders -     rosuvastatin (CRESTOR) 5 MG tablet; Take 1 tablet (5 mg total) by mouth daily. For cholesterol -     metroNIDAZOLE (METROGEL) 1 %  gel; Apply topically daily. To affected areas of face -     omeprazole (PRILOSEC) 40 MG capsule; Take 1 capsule (40 mg total) by mouth daily. On an empty stomach. For stomach       I have changed Tyler Cons Jr.'s lisinopril. I am also having him start on metroNIDAZOLE and omeprazole. Additionally, I am having him maintain his aspirin, sildenafil, pantoprazole, diclofenac, cyclobenzaprine, doxepin, hydrochlorothiazide, and rosuvastatin. We will continue to administer sodium chloride.  Allergies as of 07/30/2020      Reactions   Pravastatin    Hurting "badly"      Medication List       Accurate as of Jul 30, 2020 10:44 PM. If you have any questions, ask your nurse or doctor.         aspirin 81 MG tablet Take 81 mg by mouth daily.   cyclobenzaprine 10 MG tablet Commonly known as: FLEXERIL Take 1 tablet (10 mg total) by mouth 3 (three) times daily as needed for muscle spasms.   diclofenac 75 MG EC tablet Commonly known as: VOLTAREN TAKE 1 TABLET (75 MG TOTAL) BY MOUTH 2 (TWO) TIMES DAILY. FOR MUSCLE AND JOINT PAIN   doxepin 25 MG capsule Commonly known as: SINEQUAN Take 1 capsule (25 mg total) by mouth at bedtime.   hydrochlorothiazide 25 MG tablet Commonly known as: HYDRODIURIL Take 1 tablet (25 mg total) by mouth daily.   lisinopril 10 MG tablet Commonly known as: ZESTRIL Take 1 tablet (10 mg total) by mouth daily.   metroNIDAZOLE 1 % gel Commonly known as: Metrogel Apply topically daily. To affected areas of face Started by: Claretta Fraise, MD   omeprazole 40 MG capsule Commonly known as: PRILOSEC Take 1 capsule (40 mg total) by mouth daily. On an empty stomach. For stomach Started by: Claretta Fraise, MD   pantoprazole 40 MG tablet Commonly known as: PROTONIX Take 1 tablet (40 mg total) by mouth daily. For stomach   rosuvastatin 5 MG tablet Commonly known as: Crestor Take 1 tablet (5 mg total) by mouth daily. For cholesterol   sildenafil 20 MG tablet Commonly known as: REVATIO Take 2-5 tablets daily as needed for sex        Follow-up: Return in about 6 weeks (around 09/10/2020).  Claretta Fraise, M.D.

## 2020-08-01 ENCOUNTER — Other Ambulatory Visit: Payer: Self-pay | Admitting: Family Medicine

## 2020-08-01 ENCOUNTER — Telehealth: Payer: Self-pay | Admitting: Family Medicine

## 2020-08-01 MED ORDER — MELOXICAM 15 MG PO TABS: 15.0000 mg | ORAL_TABLET | Freq: Every day | ORAL | 5 refills | Status: DC

## 2020-08-01 NOTE — Telephone Encounter (Signed)
I do not see any mention of Meloxicam in the office visit notes, please advise.

## 2020-08-01 NOTE — Telephone Encounter (Signed)
Patient aware.

## 2020-08-01 NOTE — Telephone Encounter (Signed)
Please let the patient know that I sent their prescription to their pharmacy. Thanks, WS 

## 2020-10-01 ENCOUNTER — Other Ambulatory Visit: Payer: Self-pay | Admitting: Family Medicine

## 2020-10-01 DIAGNOSIS — G8929 Other chronic pain: Secondary | ICD-10-CM

## 2020-10-27 ENCOUNTER — Other Ambulatory Visit: Payer: Self-pay | Admitting: Family Medicine

## 2021-01-25 ENCOUNTER — Other Ambulatory Visit: Payer: Self-pay | Admitting: Family Medicine

## 2021-01-28 ENCOUNTER — Other Ambulatory Visit: Payer: Self-pay | Admitting: Family Medicine

## 2021-01-29 ENCOUNTER — Encounter: Payer: Self-pay | Admitting: Family Medicine

## 2021-01-29 ENCOUNTER — Ambulatory Visit (INDEPENDENT_AMBULATORY_CARE_PROVIDER_SITE_OTHER): Payer: Managed Care, Other (non HMO) | Admitting: Family Medicine

## 2021-01-29 ENCOUNTER — Other Ambulatory Visit: Payer: Self-pay

## 2021-01-29 VITALS — BP 140/84 | HR 94 | Temp 97.3°F | Ht 62.0 in | Wt 200.8 lb

## 2021-01-29 DIAGNOSIS — E785 Hyperlipidemia, unspecified: Secondary | ICD-10-CM | POA: Diagnosis not present

## 2021-01-29 DIAGNOSIS — Z23 Encounter for immunization: Secondary | ICD-10-CM

## 2021-01-29 DIAGNOSIS — I10 Essential (primary) hypertension: Secondary | ICD-10-CM

## 2021-01-29 MED ORDER — LISINOPRIL 10 MG PO TABS: 10.0000 mg | ORAL_TABLET | Freq: Every day | ORAL | 3 refills | Status: DC

## 2021-01-29 MED ORDER — MELOXICAM 15 MG PO TABS: 15.0000 mg | ORAL_TABLET | Freq: Every day | ORAL | 3 refills | Status: DC

## 2021-01-29 MED ORDER — HYDROCHLOROTHIAZIDE 25 MG PO TABS: 25.0000 mg | ORAL_TABLET | Freq: Every day | ORAL | 3 refills | Status: DC

## 2021-01-29 MED ORDER — PREDNISONE 10 MG PO TABS: ORAL_TABLET | ORAL | 0 refills | Status: DC

## 2021-01-29 MED ORDER — OMEPRAZOLE 40 MG PO CPDR: 40.0000 mg | DELAYED_RELEASE_CAPSULE | Freq: Every day | ORAL | 3 refills | Status: DC

## 2021-01-29 NOTE — Progress Notes (Addendum)
Subjective:  Patient ID: Tyler Busing., male    DOB: 05-Apr-1955  Age: 65 y.o. MRN: [id number]  CC: Medical Management of Chronic Issues   HPI Tyler Pruitt. presents for follow-up of elevated cholesterol. Doing well without complaints on current medication.DCed statin four months ago ito see if it was causing joint pain. Sx didn't go away.including myalgia and arthralgia and nausea. Also in today for liver function testing. Currently no chest pain, shortness of breath or other cardiovascular related symptoms noted.   presents for  follow-up of hypertension. Patient has no history of headache chest pain or shortness of breath or recent cough. Patient also denies symptoms of TIA such as focal numbness or weakness. Patient denies side effects from medication. States taking it regularly. BP running 504H & 364B systolicand 83-77 diastolic.   Back pain now manageable. Hips also. Struggling with weight loss. Declines weight loss med. Says he has to eat. Declines nutrition consult as well.   Spot on top of head to check. Has derm appointment.   Right hand going numb. Starts at thumb progresses from tingling to burning. Moves across the fingers and hand.   History Tyler Pruitt has a past medical history of Anxiety, Fatty liver, GAD (generalized anxiety disorder), GERD (gastroesophageal reflux disease), Hyperlipidemia, Hypertension, IBS (irritable bowel syndrome), Low testosterone, Palpitations, Palpitations, and Vitamin D deficiency.   He has a past surgical history that includes None.   His family history includes Heart disease (age of onset: 42) in his father; Rheum arthritis in his mother.He reports that he quit smoking about 29 years ago. His smoking use included cigarettes. He has never used smokeless tobacco. He reports current alcohol use of about 2.0 - 3.0 standard drinks per week. He reports that he does not use drugs.  Current Outpatient Medications on File Prior to Visit  Medication Sig  Dispense Refill   aspirin 81 MG tablet Take 81 mg by mouth daily.     cyclobenzaprine (FLEXERIL) 10 MG tablet TAKE 1 TABLET BY MOUTH THREE TIMES A DAY AS NEEDED FOR MUSCLE SPASMS 90 tablet 3   doxepin (SINEQUAN) 25 MG capsule Take 1 capsule (25 mg total) by mouth at bedtime. 90 capsule 3   metroNIDAZOLE (METROGEL) 1 % gel Apply topically daily. To affected areas of face 45 g 11   rosuvastatin (CRESTOR) 5 MG tablet Take 1 tablet (5 mg total) by mouth daily. For cholesterol 90 tablet 3   sildenafil (REVATIO) 20 MG tablet Take 2-5 tablets daily as needed for sex 50 tablet 2   Current Facility-Administered Medications on File Prior to Visit  Medication Dose Route Frequency Provider Last Rate Last Admin   0.9 %  sodium chloride infusion  500 mL Intravenous Continuous Ladene Artist, MD        ROS Review of Systems  Constitutional:  Negative for fever.  Respiratory:  Negative for shortness of breath.   Cardiovascular:  Negative for chest pain.  Musculoskeletal:  Negative for arthralgias.  Skin:  Negative for rash.   Objective:  BP 140/84   Pulse 94   Temp (!) 97.3 F (36.3 C)   Ht 5' 2"  (1.575 m)   Wt 200 lb 12.8 oz (91.1 kg)   SpO2 98%   BMI 36.73 kg/m   BP Readings from Last 3 Encounters:  01/29/21 140/84  07/30/20 126/72  05/01/20 129/81    Wt Readings from Last 3 Encounters:  01/29/21 200 lb 12.8 oz (91.1 kg)  07/30/20 205  lb 3.2 oz (93.1 kg)  05/01/20 198 lb 6 oz (90 kg)     Physical Exam Vitals reviewed.  Constitutional:      Appearance: He is well-developed.  HENT:     Head: Normocephalic and atraumatic.     Right Ear: External ear normal.     Left Ear: External ear normal.     Mouth/Throat:     Pharynx: No oropharyngeal exudate or posterior oropharyngeal erythema.  Eyes:     Pupils: Pupils are equal, round, and reactive to light.  Cardiovascular:     Rate and Rhythm: Normal rate and regular rhythm.     Heart sounds: No murmur heard. Pulmonary:      Effort: No respiratory distress.     Breath sounds: Normal breath sounds.  Musculoskeletal:     Cervical back: Normal range of motion and neck supple.  Neurological:     Mental Status: He is alert and oriented to person, place, and time.    Lab Results  Component Value Date   HGBA1C 5.1 02/06/2013    Lab Results  Component Value Date   WBC 6.3 03/20/2020   HGB 15.4 03/20/2020   HCT 47.1 03/20/2020   PLT 268 03/20/2020   GLUCOSE 113 (H) 03/20/2020   CHOL 221 (H) 03/20/2020   TRIG 129 03/20/2020   HDL 43 03/20/2020   LDLCALC 155 (H) 03/20/2020   ALT 63 (H) 03/20/2020   AST 35 03/20/2020   NA 138 03/20/2020   K 3.8 03/20/2020   CL 96 03/20/2020   CREATININE 1.26 03/20/2020   BUN 19 03/20/2020   CO2 28 03/20/2020   TSH 3.270 06/02/2013   PSA 0.82 06/07/2012   HGBA1C 5.1 02/06/2013    US Soft Tissue Head/Neck  Result Date: 01/25/2014 CLINICAL DATA:  65 year old male with a history of globus sensation. EXAM: THYROID ULTRASOUND TECHNIQUE: Ultrasound examination of the thyroid gland and adjacent soft tissues was performed. COMPARISON:  None FINDINGS: Right thyroid lobe Measurements: 3.3 cm x 1.2 cm x 0.9 cm.  No nodules visualized. Left thyroid lobe Measurements: 3.5 cm x 1.2 cm x 0.9 cm.  No nodules visualized. Isthmus Thickness: 3 mm.  No nodules visualized. Lymphadenopathy None visualized. IMPRESSION: Unremarkable sonographic survey of the thyroid. Signed, Dulcy Fanny. Earleen Newport, DO [facility] Radiology Electronically Signed   By: Corrie Mckusick D.O.   On: 01/25/2014 15:25    Assessment & Plan:   Banner was seen today for medical management of chronic issues.  Diagnoses and all orders for this visit:  Essential hypertension -     hydrochlorothiazide (HYDRODIURIL) 25 MG tablet; Take 1 tablet (25 mg total) by mouth daily. -     lisinopril (ZESTRIL) 10 MG tablet; Take 1 tablet (10 mg total) by mouth daily. -     CBC with  Differential/Platelet -     Lipid panel -     CMP14+EGFR  Hyperlipidemia, unspecified hyperlipidemia type -     CBC with Differential/Platelet -     Lipid panel -     CMP14+EGFR  Other orders -     predniSONE (DELTASONE) 10 MG tablet; Take 5 daily for 3 days followed by 4,3,2 and 1 for 3 days each. -     omeprazole (PRILOSEC) 40 MG capsule; Take 1 capsule (40 mg total) by mouth daily. On an empty stomach. For stomach -     meloxicam (MOBIC) 15 MG tablet; Take 1 tablet (15 mg total) by mouth daily. For  joint and muscle pain  I have discontinued Real Cons Jr.'s pantoprazole. I am also having him start on predniSONE. Additionally, I am having him maintain his aspirin, sildenafil, doxepin, rosuvastatin, metroNIDAZOLE, cyclobenzaprine, hydrochlorothiazide, lisinopril, omeprazole, and meloxicam. We will continue to administer sodium chloride.  Meds ordered this encounter  Medications   hydrochlorothiazide (HYDRODIURIL) 25 MG tablet    Sig: Take 1 tablet (25 mg total) by mouth daily.    Dispense:  90 tablet    Refill:  3   lisinopril (ZESTRIL) 10 MG tablet    Sig: Take 1 tablet (10 mg total) by mouth daily.    Dispense:  90 tablet    Refill:  3   predniSONE (DELTASONE) 10 MG tablet    Sig: Take 5 daily for 3 days followed by 4,3,2 and 1 for 3 days each.    Dispense:  45 tablet    Refill:  0   omeprazole (PRILOSEC) 40 MG capsule    Sig: Take 1 capsule (40 mg total) by mouth daily. On an empty stomach. For stomach    Dispense:  90 capsule    Refill:  3   meloxicam (MOBIC) 15 MG tablet    Sig: Take 1 tablet (15 mg total) by mouth daily. For joint and muscle pain    Dispense:  90 tablet    Refill:  3     Follow-up: Return in about 6 months (around 07/29/2021).  Claretta Fraise, M.D.

## 2021-01-30 ENCOUNTER — Other Ambulatory Visit: Payer: Self-pay | Admitting: Family Medicine

## 2021-01-30 LAB — CMP14+EGFR
ALT: 61 IU/L — ABNORMAL HIGH (ref 0–44)
AST: 40 IU/L (ref 0–40)
Albumin/Globulin Ratio: 1.6 (ref 1.2–2.2)
Albumin: 4.6 g/dL (ref 3.8–4.8)
Alkaline Phosphatase: 84 IU/L (ref 44–121)
BUN/Creatinine Ratio: 13 (ref 10–24)
BUN: 16 mg/dL (ref 8–27)
Bilirubin Total: 0.4 mg/dL (ref 0.0–1.2)
CO2: 27 mmol/L (ref 20–29)
Calcium: 9.8 mg/dL (ref 8.6–10.2)
Chloride: 99 mmol/L (ref 96–106)
Creatinine, Ser: 1.28 mg/dL — ABNORMAL HIGH (ref 0.76–1.27)
Globulin, Total: 2.9 g/dL (ref 1.5–4.5)
Glucose: 105 mg/dL — ABNORMAL HIGH (ref 70–99)
Potassium: 4.2 mmol/L (ref 3.5–5.2)
Sodium: 142 mmol/L (ref 134–144)
Total Protein: 7.5 g/dL (ref 6.0–8.5)
eGFR: 63 mL/min/{1.73_m2} (ref >59)

## 2021-01-30 LAB — CBC WITH DIFFERENTIAL/PLATELET
Basophils Absolute: 0.1 10*3/uL (ref 0.0–0.2)
Basos: 1 %
EOS (ABSOLUTE): 0.2 10*3/uL (ref 0.0–0.4)
Eos: 2 %
Hematocrit: 46.5 % (ref 37.5–51.0)
Hemoglobin: 15.3 g/dL (ref 13.0–17.7)
Immature Grans (Abs): 0 10*3/uL (ref 0.0–0.1)
Immature Granulocytes: 1 %
Lymphocytes Absolute: 1.7 10*3/uL (ref 0.7–3.1)
Lymphs: 25 %
MCH: 29 pg (ref 26.6–33.0)
MCHC: 32.9 g/dL (ref 31.5–35.7)
MCV: 88 fL (ref 79–97)
Monocytes Absolute: 0.5 10*3/uL (ref 0.1–0.9)
Monocytes: 8 %
Neutrophils Absolute: 4.4 10*3/uL (ref 1.4–7.0)
Neutrophils: 63 %
Platelets: 255 10*3/uL (ref 150–450)
RBC: 5.27 x10E6/uL (ref 4.14–5.80)
RDW: 13 % (ref 11.6–15.4)
WBC: 6.9 10*3/uL (ref 3.4–10.8)

## 2021-01-30 LAB — LIPID PANEL
Chol/HDL Ratio: 4.9 ratio (ref 0.0–5.0)
Cholesterol, Total: 228 mg/dL — ABNORMAL HIGH (ref 100–199)
HDL: 47 mg/dL (ref >39)
LDL Chol Calc (NIH): 162 mg/dL — ABNORMAL HIGH (ref 0–99)
Triglycerides: 107 mg/dL (ref 0–149)
VLDL Cholesterol Cal: 19 mg/dL (ref 5–40)

## 2021-01-30 MED ORDER — EZETIMIBE 10 MG PO TABS: 10.0000 mg | ORAL_TABLET | Freq: Every day | ORAL | 3 refills | Status: DC

## 2021-01-31 NOTE — Progress Notes (Signed)
Pt returning call about labs. Please call back.

## 2021-02-03 ENCOUNTER — Encounter: Payer: Self-pay | Admitting: *Deleted

## 2021-02-03 NOTE — Progress Notes (Signed)
Patient aware, viewed on mychart

## 2021-02-07 ENCOUNTER — Other Ambulatory Visit: Payer: Self-pay | Admitting: Family Medicine

## 2021-02-07 DIAGNOSIS — M545 Low back pain, unspecified: Secondary | ICD-10-CM

## 2021-02-07 DIAGNOSIS — G8929 Other chronic pain: Secondary | ICD-10-CM

## 2021-02-07 NOTE — Telephone Encounter (Signed)
Last office visit 01/29/21 Last refill 10/01/20, #90, 3 refills

## 2021-04-22 ENCOUNTER — Other Ambulatory Visit: Payer: Self-pay | Admitting: Family Medicine

## 2021-06-14 ENCOUNTER — Other Ambulatory Visit: Payer: Self-pay | Admitting: Family Medicine

## 2021-06-14 DIAGNOSIS — M545 Low back pain, unspecified: Secondary | ICD-10-CM

## 2021-06-16 ENCOUNTER — Other Ambulatory Visit: Payer: Self-pay | Admitting: Family Medicine

## 2021-06-16 DIAGNOSIS — G5601 Carpal tunnel syndrome, right upper limb: Secondary | ICD-10-CM

## 2021-07-03 ENCOUNTER — Other Ambulatory Visit: Payer: Self-pay | Admitting: Family Medicine

## 2021-07-03 DIAGNOSIS — F5101 Primary insomnia: Secondary | ICD-10-CM

## 2021-07-06 ENCOUNTER — Other Ambulatory Visit: Payer: Self-pay | Admitting: Family Medicine

## 2021-07-06 MED ORDER — ESOMEPRAZOLE MAGNESIUM 40 MG PO CPDR: 40.0000 mg | DELAYED_RELEASE_CAPSULE | Freq: Every day | ORAL | 3 refills | Status: DC

## 2021-07-06 NOTE — Telephone Encounter (Signed)
I sent in a substitute ?

## 2021-07-23 DIAGNOSIS — D225 Melanocytic nevi of trunk: Secondary | ICD-10-CM | POA: Diagnosis not present

## 2021-07-23 DIAGNOSIS — L249 Irritant contact dermatitis, unspecified cause: Secondary | ICD-10-CM | POA: Diagnosis not present

## 2021-07-23 DIAGNOSIS — L821 Other seborrheic keratosis: Secondary | ICD-10-CM | POA: Diagnosis not present

## 2021-07-23 DIAGNOSIS — L814 Other melanin hyperpigmentation: Secondary | ICD-10-CM | POA: Diagnosis not present

## 2021-07-29 ENCOUNTER — Ambulatory Visit: Payer: BC Managed Care – PPO | Admitting: Family Medicine

## 2021-07-29 ENCOUNTER — Encounter: Payer: Self-pay | Admitting: Family Medicine

## 2021-07-29 VITALS — BP 135/78 | HR 102 | Temp 98.6°F | Ht 62.0 in | Wt 202.6 lb

## 2021-07-29 DIAGNOSIS — Z125 Encounter for screening for malignant neoplasm of prostate: Secondary | ICD-10-CM

## 2021-07-29 DIAGNOSIS — E785 Hyperlipidemia, unspecified: Secondary | ICD-10-CM

## 2021-07-29 DIAGNOSIS — G5601 Carpal tunnel syndrome, right upper limb: Secondary | ICD-10-CM

## 2021-07-29 DIAGNOSIS — I10 Essential (primary) hypertension: Secondary | ICD-10-CM | POA: Diagnosis not present

## 2021-07-29 NOTE — Progress Notes (Signed)
? ?Subjective:  ?Patient ID: Tyler Pruitt.,  ?male    DOB: January 05, 1956  Age: 66 y.o.  ? ? ?CC: Medical Management of Chronic Issues ? ? ?HPI ?[name]. presents for  follow-up of hypertension. Patient has no history of headache chest pain or shortness of breath or recent cough. Patient also denies symptoms of TIA such as numbness weakness lateralizing. Patient denies side effects from medication. States taking it regularly. ? ?Patient also  in for follow-up of elevated cholesterol. Doing well without complaints on current medication. Denies side effects  including myalgia and arthralgia and nausea. Also in today for liver function testing. Currently no chest pain, shortness of breath or other cardiovascular related symptoms noted.Taking ezetimibe 2-3 times a week. ? ?Stopped the meloxicam and omeprazole. Feels about the same without them. Prefers to stay off of them.  ? ?Prednisone and brace helped carpal tunnel of right wrist for a few months. However now getting worse. Driving him crazy. Has appointment with Dr. Arnoldo Morale of neurosurgery next week. WAnts to get it taken care of ASAP. Pain is interfering with sleep.  ? ? ? ? ?History ?Arvle has a past medical history of Anxiety, Fatty liver, GAD (generalized anxiety disorder), GERD (gastroesophageal reflux disease), Hyperlipidemia, Hypertension, IBS (irritable bowel syndrome), Low testosterone, Palpitations, Palpitations, and Vitamin D deficiency.  ? ?He has a past surgical history that includes None.  ? ?His family history includes Heart disease (age of onset: 68) in his father; Rheum arthritis in his mother.He reports that he quit smoking about 29 years ago. His smoking use included cigarettes. He has never used smokeless tobacco. He reports current alcohol use of about 2.0 - 3.0 standard drinks per week. He reports that he does not use drugs. ? ?Current Outpatient Medications on File Prior to Visit  ?Medication Sig Dispense Refill  ? cyclobenzaprine  (FLEXERIL) 10 MG tablet TAKE 1 TABLET BY MOUTH THREE TIMES A DAY AS NEEDED FOR MUSCLE SPASMS 90 tablet 3  ? doxepin (SINEQUAN) 25 MG capsule TAKE 1 CAPSULE BY MOUTH AT BEDTIME. 90 capsule 0  ? ezetimibe (ZETIA) 10 MG tablet Take 1 tablet (10 mg total) by mouth daily. 90 tablet 3  ? hydrochlorothiazide (HYDRODIURIL) 25 MG tablet Take 1 tablet (25 mg total) by mouth daily. 90 tablet 3  ? lisinopril (ZESTRIL) 10 MG tablet Take 1 tablet (10 mg total) by mouth daily. 90 tablet 3  ? ?Current Facility-Administered Medications on File Prior to Visit  ?Medication Dose Route Frequency Provider Last Rate Last Admin  ? 0.9 %  sodium chloride infusion  500 mL Intravenous Continuous Ladene Artist, MD      ? ? ?ROS ?Review of Systems  ?Constitutional:  Negative for fever.  ?Respiratory:  Negative for shortness of breath.   ?Cardiovascular:  Negative for chest pain.  ?Musculoskeletal:  Negative for arthralgias.  ?Skin:  Negative for rash.  ? ?Objective:  ?BP 135/78   Pulse (!) 102   Temp 98.6 ?F (37 ?C)   Ht 5' 2"  (1.575 m)   Wt 202 lb 9.6 oz (91.9 kg)   SpO2 96%   BMI 37.06 kg/m?  ? ?BP Readings from Last 3 Encounters:  ?07/29/21 135/78  ?01/29/21 140/84  ?07/30/20 126/72  ? ? ?Wt Readings from Last 3 Encounters:  ?07/29/21 202 lb 9.6 oz (91.9 kg)  ?01/29/21 200 lb 12.8 oz (91.1 kg)  ?07/30/20 205 lb 3.2 oz (93.1 kg)  ? ? ? ?Physical Exam ?Vitals reviewed.  ?  Constitutional:   ?   Appearance: He is well-developed.  ?HENT:  ?   Head: Normocephalic and atraumatic.  ?   Right Ear: External ear normal.  ?   Left Ear: External ear normal.  ?   Mouth/Throat:  ?   Pharynx: No oropharyngeal exudate or posterior oropharyngeal erythema.  ?Eyes:  ?   Pupils: Pupils are equal, round, and reactive to light.  ?Cardiovascular:  ?   Rate and Rhythm: Normal rate and regular rhythm.  ?   Heart sounds: No murmur heard. ?Pulmonary:  ?   Effort: No respiratory distress.  ?   Breath sounds: Normal breath sounds.  ?Musculoskeletal:  ?   Cervical  back: Normal range of motion and neck supple.  ?Neurological:  ?   Mental Status: He is alert and oriented to person, place, and time.  ? ? ?Diabetic Foot Exam - Simple   ?No data filed ?  ? ? ?Lab Results  ?Component Value Date  ? HGBA1C 5.1 02/06/2013  ? ? ?Assessment & Plan:  ? ?Jyden was seen today for medical management of chronic issues. ? ?Diagnoses and all orders for this visit: ? ?Essential hypertension ?-     CBC with Differential/Platelet ?-     CMP14+EGFR ? ?Hyperlipidemia, unspecified hyperlipidemia type ?-     Lipid panel ? ?Screening for prostate cancer ?-     PSA, total and free ? ?Carpal tunnel syndrome of right wrist ? ? ?I have discontinued Real Cons Jr.'s aspirin, sildenafil, rosuvastatin, metroNIDAZOLE, predniSONE, meloxicam, and esomeprazole. I am also having him maintain his hydrochlorothiazide, lisinopril, ezetimibe, cyclobenzaprine, and doxepin. We will continue to administer sodium chloride. ? ?No orders of the defined types were placed in this encounter. ? ? ? ?Follow-up: Return in about 6 months (around 01/29/2022) for Compete physical, hypertension, cholesterol. ? ?Claretta Fraise, M.D. ?

## 2021-07-30 LAB — PSA, TOTAL AND FREE
PSA, Free Pct: 33 %
PSA, Free: 0.33 ng/mL
Prostate Specific Ag, Serum: 1 ng/mL (ref 0.0–4.0)

## 2021-07-30 LAB — CMP14+EGFR
ALT: 43 IU/L (ref 0–44)
AST: 32 IU/L (ref 0–40)
Albumin/Globulin Ratio: 1.4 (ref 1.2–2.2)
Albumin: 4.6 g/dL (ref 3.8–4.8)
Alkaline Phosphatase: 79 IU/L (ref 44–121)
BUN/Creatinine Ratio: 16 (ref 10–24)
BUN: 17 mg/dL (ref 8–27)
Bilirubin Total: 0.3 mg/dL (ref 0.0–1.2)
CO2: 26 mmol/L (ref 20–29)
Calcium: 10.4 mg/dL — ABNORMAL HIGH (ref 8.6–10.2)
Chloride: 99 mmol/L (ref 96–106)
Creatinine, Ser: 1.08 mg/dL (ref 0.76–1.27)
Globulin, Total: 3.4 g/dL (ref 1.5–4.5)
Glucose: 102 mg/dL — ABNORMAL HIGH (ref 70–99)
Potassium: 4.8 mmol/L (ref 3.5–5.2)
Sodium: 142 mmol/L (ref 134–144)
Total Protein: 8 g/dL (ref 6.0–8.5)
eGFR: 77 mL/min/{1.73_m2} (ref >59)

## 2021-07-30 LAB — CBC WITH DIFFERENTIAL/PLATELET
Basophils Absolute: 0.1 10*3/uL (ref 0.0–0.2)
Basos: 1 %
EOS (ABSOLUTE): 0.2 10*3/uL (ref 0.0–0.4)
Eos: 2 %
Hematocrit: 47.1 % (ref 37.5–51.0)
Hemoglobin: 15.6 g/dL (ref 13.0–17.7)
Immature Grans (Abs): 0.1 10*3/uL (ref 0.0–0.1)
Immature Granulocytes: 1 %
Lymphocytes Absolute: 2.3 10*3/uL (ref 0.7–3.1)
Lymphs: 21 %
MCH: 29.9 pg (ref 26.6–33.0)
MCHC: 33.1 g/dL (ref 31.5–35.7)
MCV: 90 fL (ref 79–97)
Monocytes Absolute: 0.8 10*3/uL (ref 0.1–0.9)
Monocytes: 7 %
Neutrophils Absolute: 7.4 10*3/uL — ABNORMAL HIGH (ref 1.4–7.0)
Neutrophils: 68 %
Platelets: 271 10*3/uL (ref 150–450)
RBC: 5.22 x10E6/uL (ref 4.14–5.80)
RDW: 13.2 % (ref 11.6–15.4)
WBC: 10.9 10*3/uL — ABNORMAL HIGH (ref 3.4–10.8)

## 2021-07-30 LAB — LIPID PANEL
Chol/HDL Ratio: 4.5 ratio (ref 0.0–5.0)
Cholesterol, Total: 235 mg/dL — ABNORMAL HIGH (ref 100–199)
HDL: 52 mg/dL (ref >39)
LDL Chol Calc (NIH): 151 mg/dL — ABNORMAL HIGH (ref 0–99)
Triglycerides: 177 mg/dL — ABNORMAL HIGH (ref 0–149)
VLDL Cholesterol Cal: 32 mg/dL (ref 5–40)

## 2021-08-07 DIAGNOSIS — Z6836 Body mass index (BMI) 36.0-36.9, adult: Secondary | ICD-10-CM | POA: Diagnosis not present

## 2021-08-07 DIAGNOSIS — G5601 Carpal tunnel syndrome, right upper limb: Secondary | ICD-10-CM | POA: Diagnosis not present

## 2021-09-03 DIAGNOSIS — G5601 Carpal tunnel syndrome, right upper limb: Secondary | ICD-10-CM | POA: Diagnosis not present

## 2021-09-24 ENCOUNTER — Encounter: Payer: Self-pay | Admitting: Gastroenterology

## 2021-09-25 DIAGNOSIS — G5601 Carpal tunnel syndrome, right upper limb: Secondary | ICD-10-CM | POA: Diagnosis not present

## 2021-10-09 ENCOUNTER — Encounter: Payer: Self-pay | Admitting: Gastroenterology

## 2021-10-21 ENCOUNTER — Ambulatory Visit (AMBULATORY_SURGERY_CENTER): Payer: Self-pay | Admitting: *Deleted

## 2021-10-21 VITALS — Ht 62.0 in | Wt 210.8 lb

## 2021-10-21 DIAGNOSIS — Z86010 Personal history of colonic polyps: Secondary | ICD-10-CM

## 2021-10-21 MED ORDER — NA SULFATE-K SULFATE-MG SULF 17.5-3.13-1.6 GM/177ML PO SOLN: 1.0000 | Freq: Once | ORAL | 0 refills | Status: AC

## 2021-10-21 NOTE — Progress Notes (Signed)
No egg or soy allergy known to patient  No issues known to pt with past sedation with any surgeries or procedures Patient denies ever being told they had issues or difficulty with intubation  No FH of Malignant Hyperthermia Pt is not on diet pills Pt is not on home 02  Pt is not on blood thinners  Pt denies issues with constipation  No A fib or A flutter Have any cardiac testing pending--NO Pt instructed to use [email or web address] or GoodRx for a price reduction on prep   

## 2021-10-24 ENCOUNTER — Other Ambulatory Visit: Payer: Self-pay | Admitting: Family Medicine

## 2021-10-24 DIAGNOSIS — F5101 Primary insomnia: Secondary | ICD-10-CM

## 2021-10-24 NOTE — Telephone Encounter (Signed)
Last office visit 07/29/21 Last refill 07/06/21, #90, no refills

## 2021-11-03 ENCOUNTER — Other Ambulatory Visit: Payer: Self-pay | Admitting: Family Medicine

## 2021-11-03 DIAGNOSIS — I10 Essential (primary) hypertension: Secondary | ICD-10-CM

## 2021-11-11 ENCOUNTER — Encounter: Payer: Self-pay | Admitting: Gastroenterology

## 2021-11-26 ENCOUNTER — Encounter: Payer: Self-pay | Admitting: Gastroenterology

## 2021-11-26 ENCOUNTER — Ambulatory Visit (AMBULATORY_SURGERY_CENTER): Payer: BC Managed Care – PPO | Admitting: Gastroenterology

## 2021-11-26 VITALS — BP 145/83 | HR 88 | Temp 98.3°F | Resp 22 | Ht 62.0 in | Wt 210.8 lb

## 2021-11-26 DIAGNOSIS — Z09 Encounter for follow-up examination after completed treatment for conditions other than malignant neoplasm: Secondary | ICD-10-CM

## 2021-11-26 DIAGNOSIS — Z86010 Personal history of colonic polyps: Secondary | ICD-10-CM

## 2021-11-26 DIAGNOSIS — Z1211 Encounter for screening for malignant neoplasm of colon: Secondary | ICD-10-CM | POA: Diagnosis not present

## 2021-11-26 DIAGNOSIS — K635 Polyp of colon: Secondary | ICD-10-CM

## 2021-11-26 DIAGNOSIS — D125 Benign neoplasm of sigmoid colon: Secondary | ICD-10-CM

## 2021-11-26 MED ORDER — SODIUM CHLORIDE 0.9 % IV SOLN: 500.0000 mL | Freq: Once | INTRAVENOUS | Status: DC

## 2021-11-26 NOTE — Patient Instructions (Signed)
YOU HAD AN ENDOSCOPIC PROCEDURE TODAY AT [facility]:   Refer to the procedure report that was given to you for any specific questions about what was found during the examination.  If the procedure report does not answer your questions, please call your gastroenterologist to clarify.  If you requested that your care partner not be given the details of your procedure findings, then the procedure report has been included in a sealed envelope for you to review at your convenience later.  YOU SHOULD EXPECT: Some feelings of bloating in the abdomen. Passage of more gas than usual.  Walking can help get rid of the air that was put into your GI tract during the procedure and reduce the bloating. If you had a lower endoscopy (such as a colonoscopy or flexible sigmoidoscopy) you may notice spotting of blood in your stool or on the toilet paper. If you underwent a bowel prep for your procedure, you may not have a normal bowel movement for a few days.  Please Note:  You might notice some irritation and congestion in your nose or some drainage.  This is from the oxygen used during your procedure.  There is no need for concern and it should clear up in a day or so.  SYMPTOMS TO REPORT IMMEDIATELY:  Following lower endoscopy (colonoscopy or flexible sigmoidoscopy):  Excessive amounts of blood in the stool  Significant tenderness or worsening of abdominal pains  Swelling of the abdomen that is new, acute  Fever of 100F or higher   For urgent or emergent issues, a gastroenterologist can be reached at any hour by calling [phone]. Do not use MyChart messaging for urgent concerns.    DIET:  We do recommend a small meal at first, but then you may proceed to your regular diet.  Drink plenty of fluids but you should avoid alcoholic beverages for 24 hours.  MEDICATIONS: Continue present medications.  Please see handouts given to you by your recovery nurse.  Thank you for allowing [place] to  provide for your healthcare needs today.  ACTIVITY:  You should plan to take it easy for the rest of today and you should NOT DRIVE or use heavy machinery until tomorrow (because of the sedation medicines used during the test).    FOLLOW UP: Our staff will call the number listed on your records the next business day following your procedure.  We will call around 7:15- 8:00 am to check on you and address any questions or concerns that you may have regarding the information given to you following your procedure. If we do not reach you, we will leave a message.     If any biopsies were taken you will be contacted by phone or by letter within the next 1-3 weeks.  Please call us at [phone] if you have not heard about the biopsies in 3 weeks.    SIGNATURES/CONFIDENTIALITY: You and/or your care partner have signed paperwork which will be entered into your electronic medical record.  These signatures attest to the fact that that the information above on your After Visit Summary has been reviewed and is understood.  Full responsibility of the confidentiality of this discharge information lies with you and/or your care-partner.

## 2021-11-26 NOTE — Op Note (Signed)
[facility] Patient Name: Tyler Pruitt Procedure Date: 11/26/2021 2:10 PM MRN: [id number] Endoscopist: Ladene Artist , MD Age: 66 Referring MD:  Date of Birth: May 28, 1955 Gender: Male Account #: [id number] Procedure:                Colonoscopy Indications:              Surveillance: Personal history of adenomatous                            polyps on last colonoscopy 5 years ago Medicines:                Monitored Anesthesia Care Procedure:                Pre-Anesthesia Assessment:                           - Prior to the procedure, a History and Physical                            was performed, and patient medications and                            allergies were reviewed. The patient's tolerance of                            previous anesthesia was also reviewed. The risks                            and benefits of the procedure and the sedation                            options and risks were discussed with the patient.                            All questions were answered, and informed consent                            was obtained. Prior Anticoagulants: The patient has                            taken no previous anticoagulant or antiplatelet                            agents. ASA Grade Assessment: II - A patient with                            mild systemic disease. After reviewing the risks                            and benefits, the patient was deemed in                            satisfactory condition to undergo the procedure.  After obtaining informed consent, the colonoscope                            was passed under direct vision. Throughout the                            procedure, the patient's blood pressure, pulse, and                            oxygen saturations were monitored continuously. The                            CF [id number] #[id number] was introduced through the anus                            and advanced to the the  cecum, identified by                            appendiceal orifice and ileocecal valve. The                            ileocecal valve, appendiceal orifice, and rectum                            were photographed. The quality of the bowel                            preparation was good. The colonoscopy was performed                            without difficulty. The patient tolerated the                            procedure well. Scope In: 2:15:21 PM Scope Out: 2:32:22 PM Scope Withdrawal Time: 0 hours 14 minutes 28 seconds  Total Procedure Duration: 0 hours 17 minutes 1 second  Findings:                 The perianal and digital rectal examinations were                            normal.                           A 8 mm polyp was found in the sigmoid colon. The                            polyp was sessile. The polyp was removed with a                            cold snare. Resection and retrieval were complete.                           A few small-mouthed diverticula were found in the  left colon.                           The exam was otherwise without abnormality on                            direct and retroflexion views. Complications:            No immediate complications. Estimated blood loss:                            None. Estimated Blood Loss:     Estimated blood loss: none. Impression:               - One 8 mm polyp in the sigmoid colon, removed with                            a cold snare. Resected and retrieved.                           - Mild diverticulosis in the left colon.                           - The examination was otherwise normal on direct                            and retroflexion views. Recommendation:           - Repeat colonoscopy after studies are complete for                            surveillance based on pathology results.                           - Patient has a contact number available for                             emergencies. The signs and symptoms of potential                            delayed complications were discussed with the                            patient. Return to normal activities tomorrow.                            Written discharge instructions were provided to the                            patient.                           - Resume previous diet.                           - Continue present medications.                           -  Await pathology results. Ladene Artist, MD 11/26/2021 2:35:52 PM This report has been signed electronically.

## 2021-11-26 NOTE — Progress Notes (Signed)
To pacu, VSS. Report to [email or web address] 

## 2021-11-26 NOTE — Progress Notes (Signed)
History & Physical  Primary Care Physician:  Claretta Fraise, MD Primary Gastroenterologist: Lucio Edward, MD  CHIEF COMPLAINT:  Personal history of colon polyps   HPI: Tyler Pruitt. is a 66 y.o. male with a personal history of adenomatous colon polyps for surveillance colonoscopy.   Past Medical History:  Diagnosis Date   Anxiety    generalized anxiety disorder   Arthritis    BACK   Fatty liver    GAD (generalized anxiety disorder)    GERD (gastroesophageal reflux disease)    Hyperlipidemia    Hypertension    IBS (irritable bowel syndrome)    Low testosterone    Palpitations    Palpitations    Vitamin D deficiency     Past Surgical History:  Procedure Laterality Date   CARPAL TUNNEL RELEASE Right    COLONOSCOPY     None     POLYPECTOMY      Prior to Admission medications   Medication Sig Start Date End Date Taking? Authorizing Provider  doxepin (SINEQUAN) 25 MG capsule TAKE 1 CAPSULE BY MOUTH EVERYDAY AT BEDTIME 10/24/21  Yes Hendricks Limes F, FNP  ezetimibe (ZETIA) 10 MG tablet Take 1 tablet (10 mg total) by mouth daily. 01/30/21  Yes Stacks, Cletus Gash, MD  hydrochlorothiazide (HYDRODIURIL) 25 MG tablet Take 1 tablet (25 mg total) by mouth daily. 01/29/21  Yes Stacks, Cletus Gash, MD  lisinopril (ZESTRIL) 10 MG tablet Take 1 tablet (10 mg total) by mouth daily. 01/29/21  Yes Stacks, Cletus Gash, MD  cyclobenzaprine (FLEXERIL) 10 MG tablet TAKE 1 TABLET BY MOUTH THREE TIMES A DAY AS NEEDED FOR MUSCLE SPASMS Patient not taking: Reported on 10/21/2021 06/16/21   Claretta Fraise, MD    Current Outpatient Medications  Medication Sig Dispense Refill   doxepin (SINEQUAN) 25 MG capsule TAKE 1 CAPSULE BY MOUTH EVERYDAY AT BEDTIME 90 capsule 0   ezetimibe (ZETIA) 10 MG tablet Take 1 tablet (10 mg total) by mouth daily. 90 tablet 3   hydrochlorothiazide (HYDRODIURIL) 25 MG tablet Take 1 tablet (25 mg total) by mouth daily. 90 tablet 3   lisinopril (ZESTRIL) 10 MG tablet Take 1 tablet  (10 mg total) by mouth daily. 90 tablet 3   cyclobenzaprine (FLEXERIL) 10 MG tablet TAKE 1 TABLET BY MOUTH THREE TIMES A DAY AS NEEDED FOR MUSCLE SPASMS (Patient not taking: Reported on 10/21/2021) 90 tablet 3   Current Facility-Administered Medications  Medication Dose Route Frequency Provider Last Rate Last Admin   0.9 %  sodium chloride infusion  500 mL Intravenous Continuous Ladene Artist, MD       0.9 %  sodium chloride infusion  500 mL Intravenous Once Ladene Artist, MD        Allergies as of 11/26/2021 - Review Complete 11/26/2021  Allergen Reaction Noted   Pravastatin  02/06/2013    Family History  Problem Relation Age of Onset   Rheum arthritis Mother        Died age 41   Heart disease Father 69       Valve replacement, died age 46 suicide   Colon cancer Neg Hx    Colon polyps Neg Hx    Crohn's disease Neg Hx    Esophageal cancer Neg Hx    Rectal cancer Neg Hx    Stomach cancer Neg Hx    Ulcerative colitis Neg Hx     Social History   Socioeconomic History   Marital status: Married    Spouse name: Not on  file   Number of children: Not on file   Years of education: Not on file   Highest education level: Not on file  Occupational History   Occupation: [employer]    Employer: [employer]  Tobacco Use   Smoking status: Former    Types: Cigarettes    Quit date: 12/22/1991    Years since quitting: 29.9    Passive exposure: Never   Smokeless tobacco: Never   Tobacco comments:    Smoked few cigarettes in the past  Vaping Use   Vaping Use: Never used  Substance and Sexual Activity   Alcohol use: Yes    Alcohol/week: 2.0 - 3.0 standard drinks of alcohol    Types: 2 - 3 Cans of beer per week    Comment: 1-2 WEEK   Drug use: No   Sexual activity: Not on file  Other Topics Concern   Not on file  Social History Narrative   Lives with wife.  One child.  One grandchild.     Social Determinants of Health   Financial Resource Strain: Not on file   Food Insecurity: Not on file  Transportation Needs: Not on file  Physical Activity: Not on file  Stress: Not on file  Social Connections: Not on file  Intimate Partner Violence: Not on file    Review of Systems:  All systems reviewed were negative except where noted in HPI.   Physical Exam: General:  Alert, well-developed, in NAD Head:  Normocephalic and atraumatic. Eyes:  Sclera clear, no icterus.   Conjunctiva pink. Ears:  Normal auditory acuity. Mouth:  No deformity or lesions.  Neck:  Supple; no masses . Lungs:  Clear throughout to auscultation.   No wheezes, crackles, or rhonchi. No acute distress. Heart:  Regular rate and rhythm; no murmurs. Abdomen:  Soft, nondistended, nontender. No masses, hepatomegaly. No obvious masses.  Normal bowel .    Rectal:  Deferred   Msk:  Symmetrical without gross deformities.. Pulses:  Normal pulses noted. Extremities:  Without edema. Neurologic:  Alert and  oriented x4;  grossly normal neurologically. Skin:  Intact without significant lesions or rashes. Cervical Nodes:  No significant cervical adenopathy. Psych:  Alert and cooperative. Normal mood and affect.   Impression / Plan:   Personal history of adenomatous colon polyps for surveillance colonoscopy.  Tyler Pruitt Plan  11/26/2021, 2:07 PM See Shea Evans, [facility], to contact our on call provider

## 2021-11-26 NOTE — Progress Notes (Signed)
Called to room to assist during endoscopic procedure.  Patient ID and intended procedure confirmed with present staff. Received instructions for my participation in the procedure from the performing physician.  

## 2021-11-27 ENCOUNTER — Telehealth: Payer: Self-pay

## 2021-11-27 NOTE — Telephone Encounter (Signed)
  Follow up Call-     11/26/2021   12:51 PM  Call back number  Post procedure Call Back phone  # [phone]  Permission to leave phone message Yes     Patient questions:  Do you have a fever, pain , or abdominal swelling? No. Pain Score  0 *  Have you tolerated food without any problems? Yes.    Have you been able to return to your normal activities? Yes.    Do you have any questions about your discharge instructions: Diet   No. Medications  No. Follow up visit  No.  Do you have questions or concerns about your Care? No.  Actions: * If pain score is 4 or above: No action needed, pain <4.

## 2021-12-03 ENCOUNTER — Encounter: Payer: Self-pay | Admitting: Gastroenterology

## 2022-01-19 ENCOUNTER — Other Ambulatory Visit: Payer: Self-pay | Admitting: Family Medicine

## 2022-01-19 DIAGNOSIS — F5101 Primary insomnia: Secondary | ICD-10-CM

## 2022-01-23 DIAGNOSIS — G5601 Carpal tunnel syndrome, right upper limb: Secondary | ICD-10-CM | POA: Diagnosis not present

## 2022-01-27 ENCOUNTER — Encounter: Payer: Self-pay | Admitting: Family Medicine

## 2022-01-27 ENCOUNTER — Ambulatory Visit: Payer: BC Managed Care – PPO | Admitting: Family Medicine

## 2022-01-27 DIAGNOSIS — J069 Acute upper respiratory infection, unspecified: Secondary | ICD-10-CM

## 2022-01-27 DIAGNOSIS — U071 COVID-19: Secondary | ICD-10-CM

## 2022-01-27 MED ORDER — MOLNUPIRAVIR EUA 200MG CAPSULE: 4.0000 | ORAL_CAPSULE | Freq: Two times a day (BID) | ORAL | 0 refills | Status: AC

## 2022-01-27 MED ORDER — GUAIFENESIN ER 600 MG PO TB12: 600.0000 mg | ORAL_TABLET | Freq: Two times a day (BID) | ORAL | 0 refills | Status: AC

## 2022-01-27 NOTE — Progress Notes (Signed)
Virtual Visit via telephone Note Due to COVID-19 pandemic this visit was conducted virtually. This visit type was conducted due to national recommendations for restrictions regarding the COVID-19 Pandemic (e.g. social distancing, sheltering in place) in an effort to limit this patient's exposure and mitigate transmission in our community. All issues noted in this document were discussed and addressed.  A physical exam was not performed with this format.   I connected with Tyler Pruitt. on 01/27/2022 at 0900 by telephone and verified that I am speaking with the correct person using two identifiers. Tyler Pruitt. is currently located at home and family is currently with them during visit. The provider, Monia Pouch, FNP is located in their office at time of visit.  I discussed the limitations, risks, security and privacy concerns of performing an evaluation and management service by virtual visit and the availability of in person appointments. I also discussed with the patient that there may be a patient responsible charge related to this service. The patient expressed understanding and agreed to proceed.  Subjective:  Patient ID: Tyler Pruitt., male    DOB: 11-29-1955, 66 y.o.   MRN: [id number]  Chief Complaint:  Covid Positive   HPI: Tyler Pruitt. is a 66 y.o. male presenting on 01/27/2022 for Covid Positive   Pt reports him and his wife tested positive for COVID on Sunday. States she was started on antiviral therapy yesterday. He has been congested with cough and postnasal drainage. He has been taking Nyquil with minimal relief of symptoms. No shortness of breath, weakness, or confusion.      Relevant past medical, surgical, family, and social history reviewed and updated as indicated.  Allergies and medications reviewed and updated.   Past Medical History:  Diagnosis Date   Anxiety    generalized anxiety disorder   Arthritis    BACK   Fatty liver    GAD  (generalized anxiety disorder)    GERD (gastroesophageal reflux disease)    Hyperlipidemia    Hypertension    IBS (irritable bowel syndrome)    Low testosterone    Palpitations    Palpitations    Vitamin D deficiency     Past Surgical History:  Procedure Laterality Date   CARPAL TUNNEL RELEASE Right    COLONOSCOPY     None     POLYPECTOMY      Social History   Socioeconomic History   Marital status: Married    Spouse name: Not on file   Number of children: Not on file   Years of education: Not on file   Highest education level: Not on file  Occupational History   Occupation: [employer]    Employer: [employer]  Tobacco Use   Smoking status: Former    Types: Cigarettes    Quit date: 12/22/1991    Years since quitting: 30.1    Passive exposure: Never   Smokeless tobacco: Never   Tobacco comments:    Smoked few cigarettes in the past  Vaping Use   Vaping Use: Never used  Substance and Sexual Activity   Alcohol use: Yes    Alcohol/week: 2.0 - 3.0 standard drinks of alcohol    Types: 2 - 3 Cans of beer per week    Comment: 1-2 WEEK   Drug use: No   Sexual activity: Not on file  Other Topics Concern   Not on file  Social History Narrative   Lives with wife.  One child.  One grandchild.     Social Determinants of Health   Financial Resource Strain: Not on file  Food Insecurity: Not on file  Transportation Needs: Not on file  Physical Activity: Not on file  Stress: Not on file  Social Connections: Not on file  Intimate Partner Violence: Not on file    Outpatient Encounter Medications as of 01/27/2022  Medication Sig   guaiFENesin (MUCINEX) 600 MG 12 hr tablet Take 1 tablet (600 mg total) by mouth 2 (two) times daily for 10 days.   molnupiravir EUA (LAGEVRIO) 200 mg CAPS capsule Take 4 capsules (800 mg total) by mouth 2 (two) times daily for 5 days.   cyclobenzaprine (FLEXERIL) 10 MG tablet TAKE 1 TABLET BY MOUTH THREE TIMES A DAY AS NEEDED FOR  MUSCLE SPASMS (Patient not taking: Reported on 10/21/2021)   doxepin (SINEQUAN) 25 MG capsule TAKE 1 CAPSULE BY MOUTH EVERYDAY AT BEDTIME   ezetimibe (ZETIA) 10 MG tablet Take 1 tablet (10 mg total) by mouth daily.   hydrochlorothiazide (HYDRODIURIL) 25 MG tablet Take 1 tablet (25 mg total) by mouth daily.   lisinopril (ZESTRIL) 10 MG tablet Take 1 tablet (10 mg total) by mouth daily.   Facility-Administered Encounter Medications as of 01/27/2022  Medication   0.9 %  sodium chloride infusion   0.9 %  sodium chloride infusion    Allergies  Allergen Reactions   Pravastatin     Hurting "badly"    Review of Systems  Constitutional:  Positive for activity change, fatigue and fever. Negative for appetite change, chills, diaphoresis and unexpected weight change.  HENT:  Positive for congestion, postnasal drip and rhinorrhea. Negative for dental problem, drooling, ear discharge, ear pain, facial swelling, hearing loss, mouth sores, nosebleeds, sinus pressure, sinus pain, sneezing, sore throat, tinnitus, trouble swallowing and voice change.   Eyes: Negative.   Respiratory:  Positive for cough. Negative for chest tightness and shortness of breath.   Cardiovascular:  Negative for chest pain, palpitations and leg swelling.  Gastrointestinal:  Negative for blood in stool, constipation, diarrhea, nausea and vomiting.  Endocrine: Negative.   Genitourinary:  Negative for decreased urine volume, difficulty urinating, dysuria, frequency and urgency.  Musculoskeletal:  Negative for arthralgias and myalgias.  Skin: Negative.   Allergic/Immunologic: Negative.   Neurological:  Negative for dizziness, tremors, seizures, syncope, facial asymmetry, speech difficulty, weakness, light-headedness, numbness and headaches.  Hematological: Negative.   Psychiatric/Behavioral:  Negative for confusion, hallucinations, sleep disturbance and suicidal ideas.          Observations/Objective: No vital signs or  physical exam, this was a virtual health encounter.  Pt alert and oriented, answers all questions appropriately, and able to speak in full sentences.    Assessment and Plan: Jeramie was seen today for covid positive.  Diagnoses and all orders for this visit:  Positive self-administered antigen test for COVID-19 URI with cough and congestion Will start antiviral therapy and add Mucinex. Pt aware to drink plenty of water with Mucinex. Aware of red flags. Report new, worsening, or persistent symptoms.  -     molnupiravir EUA (LAGEVRIO) 200 mg CAPS capsule; Take 4 capsules (800 mg total) by mouth 2 (two) times daily for 5 days. -     guaiFENesin (MUCINEX) 600 MG 12 hr tablet; Take 1 tablet (600 mg total) by mouth 2 (two) times daily for 10 days.     Follow Up Instructions: Return if symptoms worsen or fail to improve.    I discussed the assessment and  treatment plan with the patient. The patient was provided an opportunity to ask questions and all were answered. The patient agreed with the plan and demonstrated an understanding of the instructions.   The patient was advised to call back or seek an in-person evaluation if the symptoms worsen or if the condition fails to improve as anticipated.  The above assessment and management plan was discussed with the patient. The patient verbalized understanding of and has agreed to the management plan. Patient is aware to call the clinic if they develop any new symptoms or if symptoms persist or worsen. Patient is aware when to return to the clinic for a follow-up visit. Patient educated on when it is appropriate to go to the emergency department.    I provided 15 minutes of time during this telephone encounter.   Monia Pouch, FNP-C [facility] [address] [place], [place] [address] [phone] 01/27/2022

## 2022-01-29 ENCOUNTER — Other Ambulatory Visit: Payer: Self-pay | Admitting: Family Medicine

## 2022-01-29 ENCOUNTER — Encounter: Payer: BC Managed Care – PPO | Admitting: Family Medicine

## 2022-01-29 DIAGNOSIS — I10 Essential (primary) hypertension: Secondary | ICD-10-CM

## 2022-01-30 ENCOUNTER — Other Ambulatory Visit: Payer: Self-pay | Admitting: Family Medicine

## 2022-01-30 DIAGNOSIS — I10 Essential (primary) hypertension: Secondary | ICD-10-CM

## 2022-02-19 IMAGING — DX DG KNEE 1-2V*L*
2 series · 2 of 2 positions shown · non-contrast
Comparison: None.

CLINICAL DATA: Pain in joint

EXAM:
LEFT KNEE - 1-2 VIEW

[knee ap]
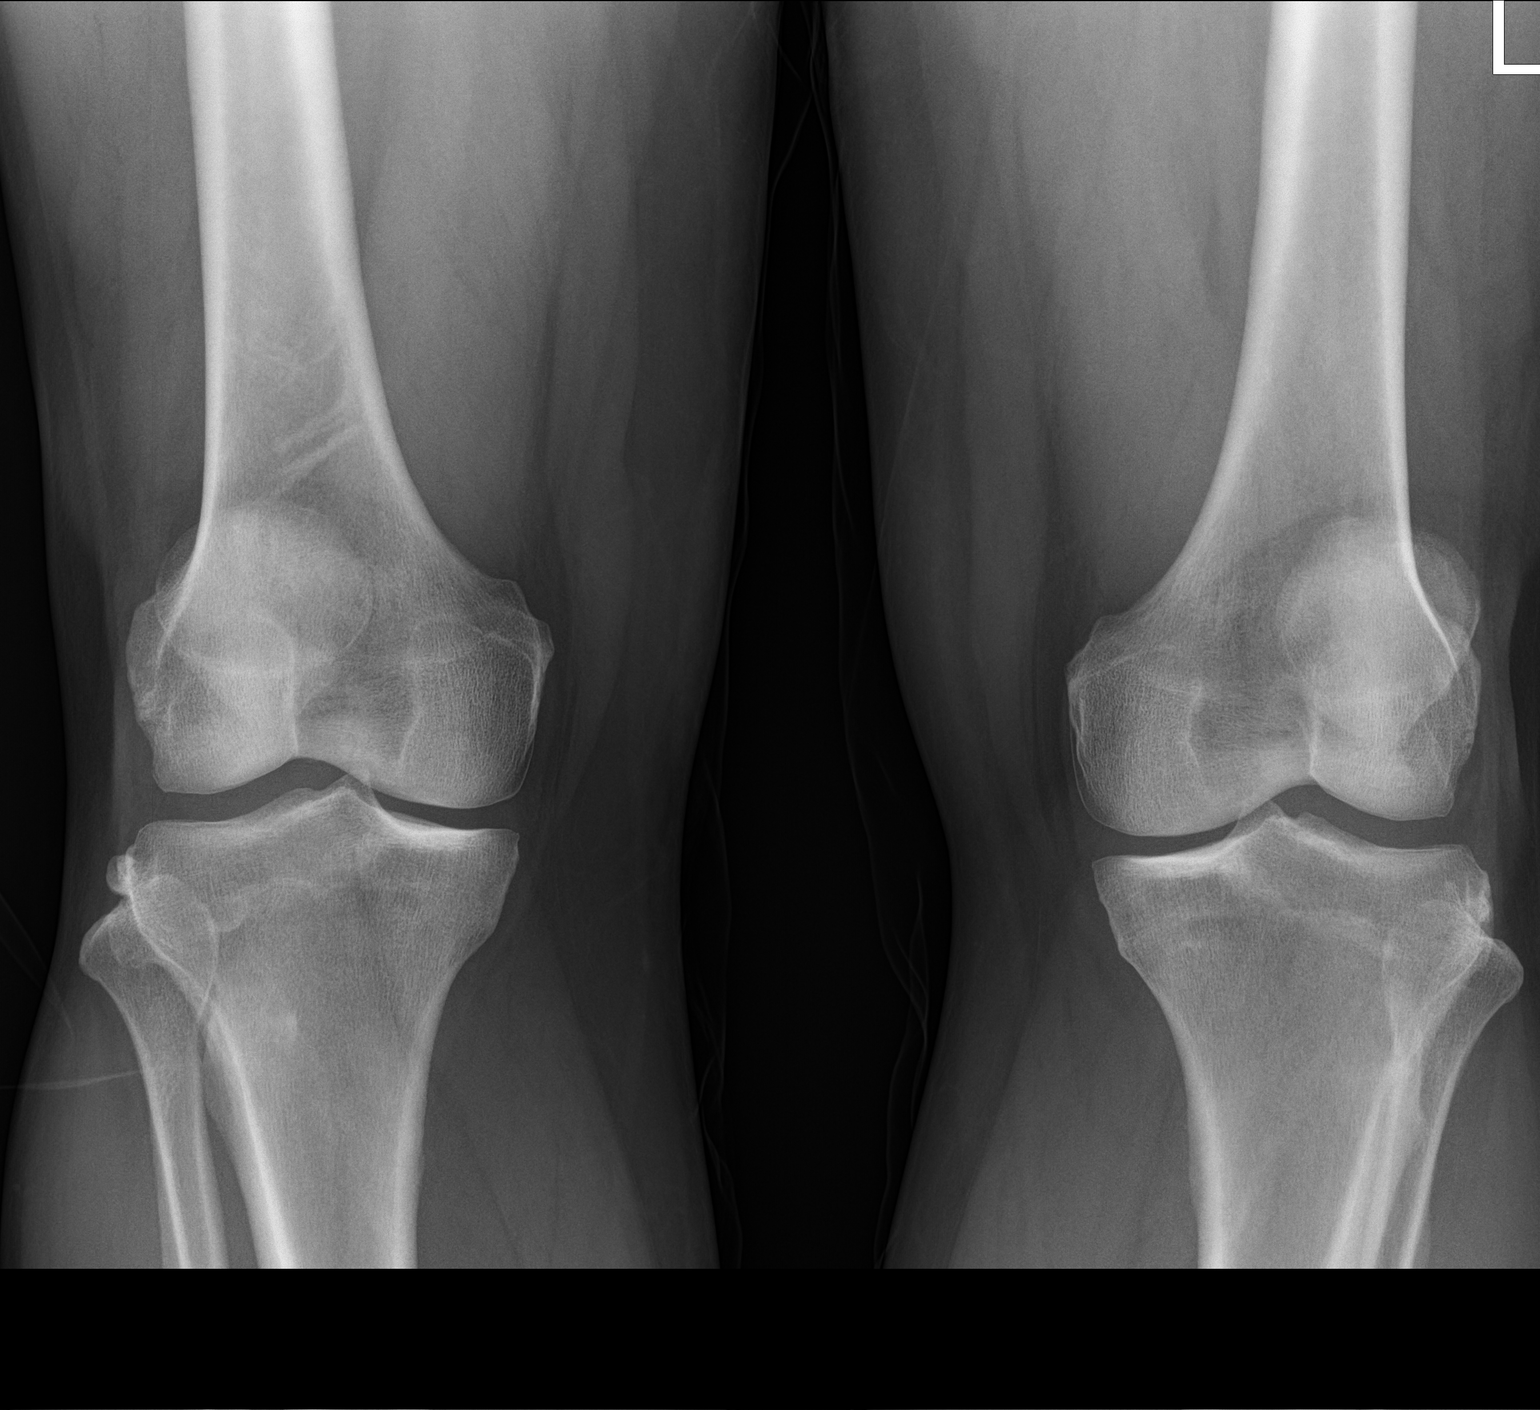

[knee lat]
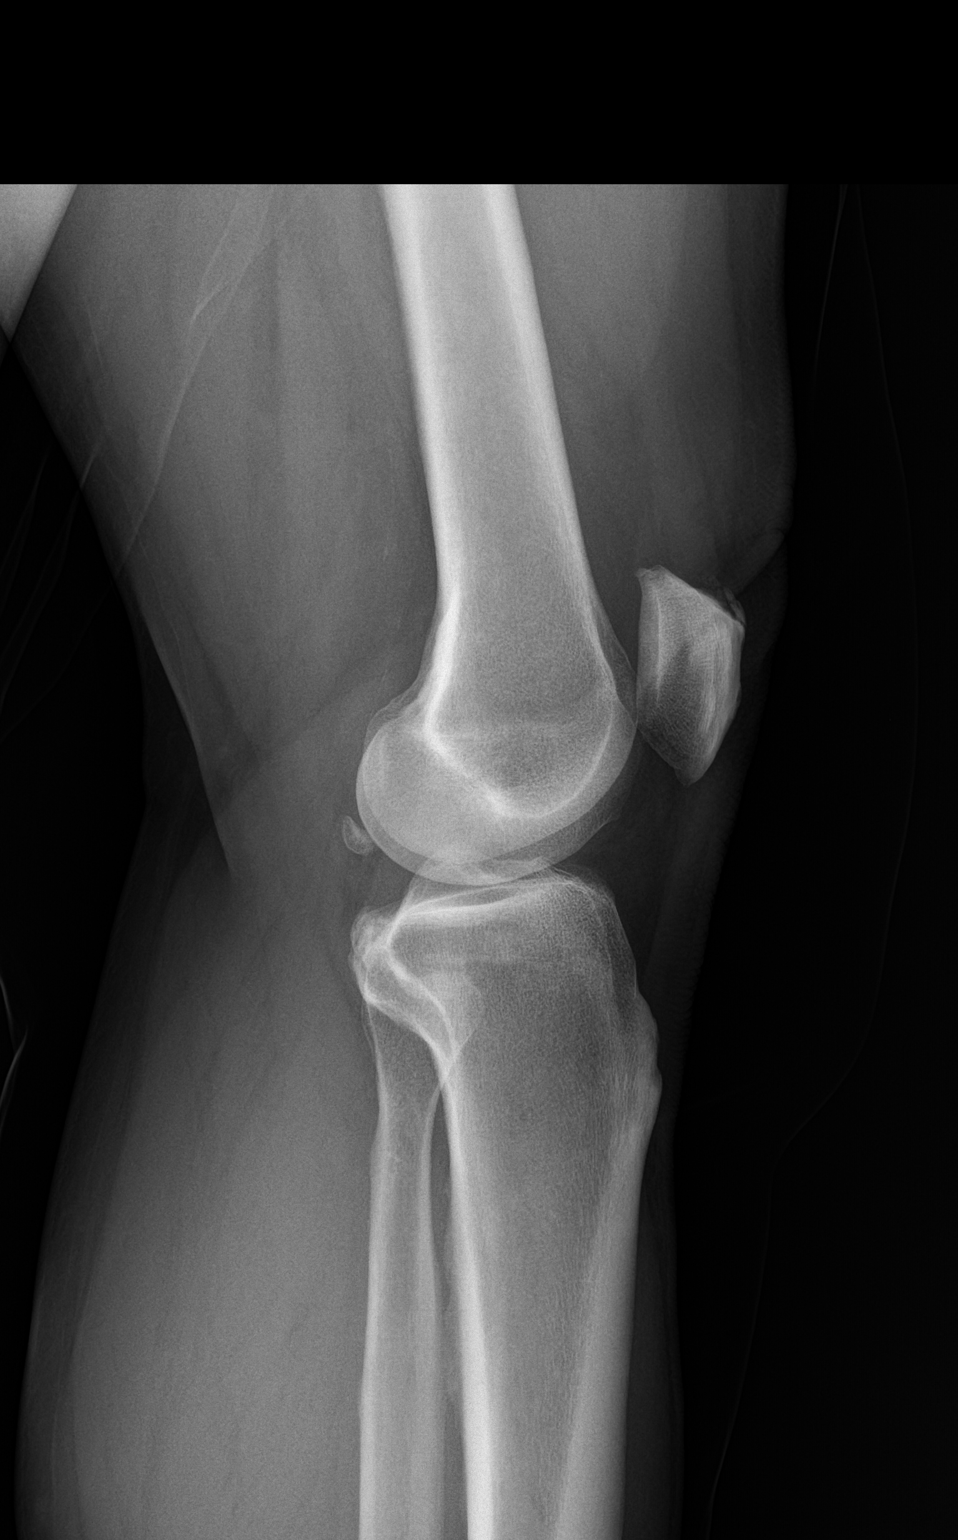

[2 of 2 positions shown; findings below may reference images not displayed]

FINDINGS: No evidence of fracture, dislocation, or joint effusion. No evidence
of arthropathy or other focal bone abnormality. Soft tissues are
unremarkable.
IMPRESSION: No significant abnormality of the left knee.

## 2022-02-19 IMAGING — DX DG KNEE 1-2V*R*
2 series · 2 of 2 positions shown · non-contrast
Comparison: None.

CLINICAL DATA: Is pain in joint

EXAM:
RIGHT KNEE - 1-2 VIEW

[knee ap]
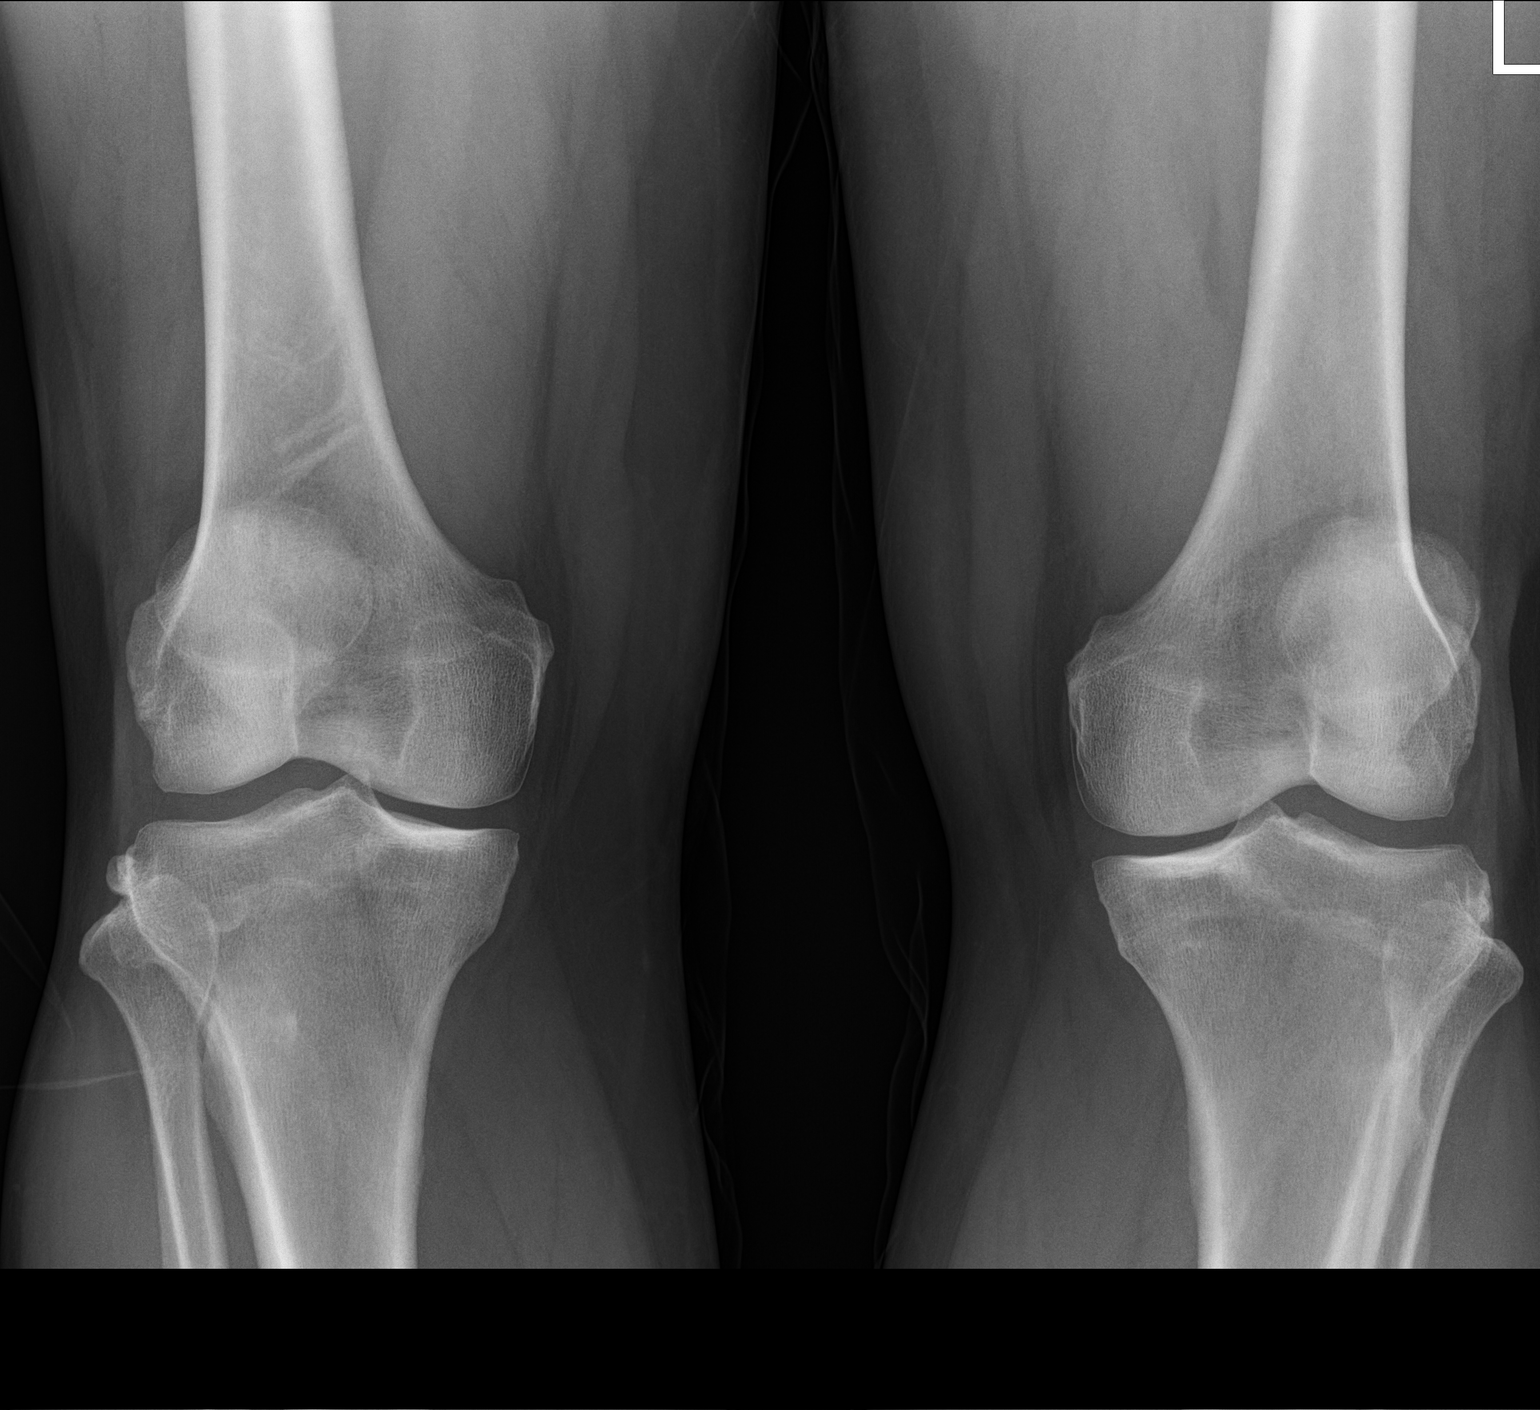

[knee lat]
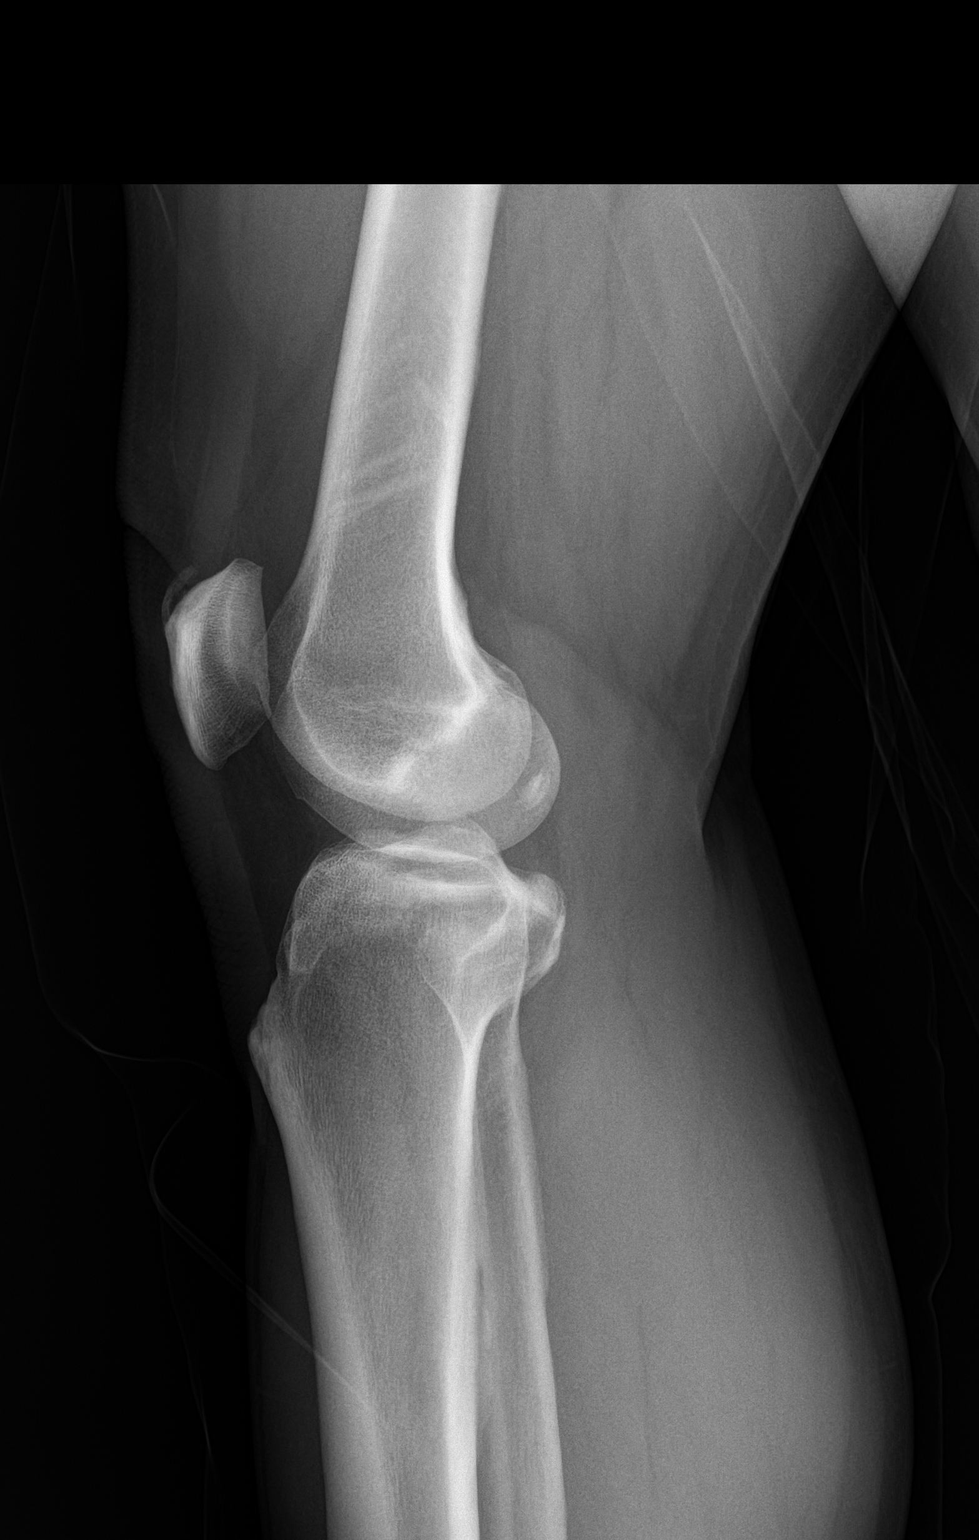

[2 of 2 positions shown; findings below may reference images not displayed]

FINDINGS: No evidence of fracture, dislocation, or joint effusion. No evidence
of arthropathy or other focal bone abnormality. Soft tissues are
unremarkable.
IMPRESSION: No significant abnormality of the right knee.

## 2022-02-20 ENCOUNTER — Other Ambulatory Visit: Payer: Self-pay | Admitting: Family Medicine

## 2022-02-20 DIAGNOSIS — I10 Essential (primary) hypertension: Secondary | ICD-10-CM

## 2022-02-20 NOTE — Telephone Encounter (Signed)
Stacks NTBS 30 days given 01/29/22

## 2022-02-23 MED ORDER — EZETIMIBE 10 MG PO TABS: 10.0000 mg | ORAL_TABLET | Freq: Every day | ORAL | 0 refills | Status: DC

## 2022-02-23 MED ORDER — HYDROCHLOROTHIAZIDE 25 MG PO TABS: 25.0000 mg | ORAL_TABLET | Freq: Every day | ORAL | 0 refills | Status: DC

## 2022-02-23 NOTE — Addendum Note (Signed)
Addended by: Antonietta Barcelona D on: 02/23/2022 09:59 AM   Modules accepted: Orders

## 2022-02-23 NOTE — Telephone Encounter (Signed)
Pt scheduled for CPE With Dr. Livia Snellen on 05/11/2022, this was first available in the morning, which is what patient requested Pt will need refill on both of these medications sent to [facility]

## 2022-03-07 ENCOUNTER — Other Ambulatory Visit: Payer: Self-pay | Admitting: Family Medicine

## 2022-03-07 DIAGNOSIS — I10 Essential (primary) hypertension: Secondary | ICD-10-CM

## 2022-03-11 ENCOUNTER — Other Ambulatory Visit: Payer: Self-pay | Admitting: Family Medicine

## 2022-03-11 DIAGNOSIS — I10 Essential (primary) hypertension: Secondary | ICD-10-CM

## 2022-04-22 ENCOUNTER — Other Ambulatory Visit: Payer: Self-pay | Admitting: Family Medicine

## 2022-04-22 DIAGNOSIS — F5101 Primary insomnia: Secondary | ICD-10-CM

## 2022-04-22 NOTE — Telephone Encounter (Signed)
Last office visit 07/29/21 Upcoming appointment 05/11/21 Last refill 01/19/22

## 2022-05-11 ENCOUNTER — Ambulatory Visit: Payer: BC Managed Care – PPO | Admitting: Family Medicine

## 2022-05-11 ENCOUNTER — Encounter: Payer: Self-pay | Admitting: Family Medicine

## 2022-05-11 VITALS — BP 153/85 | HR 91 | Temp 97.9°F | Ht 62.0 in | Wt 204.0 lb

## 2022-05-11 DIAGNOSIS — Z0001 Encounter for general adult medical examination with abnormal findings: Secondary | ICD-10-CM | POA: Diagnosis not present

## 2022-05-11 DIAGNOSIS — Z0000 Encounter for general adult medical examination without abnormal findings: Secondary | ICD-10-CM | POA: Diagnosis not present

## 2022-05-11 DIAGNOSIS — Z23 Encounter for immunization: Secondary | ICD-10-CM | POA: Diagnosis not present

## 2022-05-11 DIAGNOSIS — I10 Essential (primary) hypertension: Secondary | ICD-10-CM | POA: Diagnosis not present

## 2022-05-11 DIAGNOSIS — E559 Vitamin D deficiency, unspecified: Secondary | ICD-10-CM

## 2022-05-11 DIAGNOSIS — E782 Mixed hyperlipidemia: Secondary | ICD-10-CM | POA: Diagnosis not present

## 2022-05-11 DIAGNOSIS — F5101 Primary insomnia: Secondary | ICD-10-CM

## 2022-05-11 LAB — URINALYSIS
Bilirubin, UA: NEGATIVE
Glucose, UA: NEGATIVE
Ketones, UA: NEGATIVE
Leukocytes,UA: NEGATIVE
Nitrite, UA: NEGATIVE
Protein,UA: NEGATIVE
Specific Gravity, UA: 1.015 (ref 1.005–1.030)
Urobilinogen, Ur: 0.2 mg/dL (ref 0.2–1.0)
pH, UA: 6 (ref 5.0–7.5)

## 2022-05-11 MED ORDER — EZETIMIBE 10 MG PO TABS: 10.0000 mg | ORAL_TABLET | Freq: Every day | ORAL | 3 refills | Status: DC

## 2022-05-11 MED ORDER — DOXEPIN HCL 25 MG PO CAPS: ORAL_CAPSULE | ORAL | 3 refills | Status: DC

## 2022-05-11 MED ORDER — LISINOPRIL 10 MG PO TABS: 10.0000 mg | ORAL_TABLET | Freq: Every day | ORAL | 3 refills | Status: DC

## 2022-05-11 MED ORDER — HYDROCHLOROTHIAZIDE 25 MG PO TABS: 25.0000 mg | ORAL_TABLET | Freq: Every day | ORAL | 3 refills | Status: DC

## 2022-05-11 NOTE — Progress Notes (Signed)
Subjective:  Patient ID: [name]., male    DOB: 08/13/1955  Age: 67 y.o. MRN: [id number]  CC: Annual Exam   HPI Atlantis Rauseo. presents for Annual exam. Recent hand surgery followed by Covid. Brother In [place] recently died of unknown infection 3 days after sx started.Occurred 2 months ago. Pt. Also retired 2 mos ago.   BP 155/88 at home. Then 140/79. Working on weight loss. Eating more fruit. Admits to XS salt consumption     05/11/2022   10:50 AM 05/11/2022   10:46 AM 07/29/2021    4:05 PM  Depression screen PHQ 2/9  Decreased Interest 0 0 0  Down, Depressed, Hopeless 0 0 0  PHQ - 2 Score 0 0 0    History Rodman has a past medical history of Anxiety, Arthritis, Fatty liver, GAD (generalized anxiety disorder), GERD (gastroesophageal reflux disease), Hyperlipidemia, Hypertension, IBS (irritable bowel syndrome), Low testosterone, Palpitations, Palpitations, and Vitamin D deficiency.   He has a past surgical history that includes None; Colonoscopy; Polypectomy; and Carpal tunnel release (Right).   His family history includes Heart disease (age of onset: 62) in his father; Rheum arthritis in his mother.He reports that he quit smoking about 30 years ago. His smoking use included cigarettes. He has never been exposed to tobacco smoke. He has never used smokeless tobacco. He reports current alcohol use of about 2.0 - 3.0 standard drinks of alcohol per week. He reports that he does not use drugs.    ROS Review of Systems  Constitutional:  Negative for activity change, fatigue and unexpected weight change.  HENT:  Negative for congestion, ear pain, hearing loss, postnasal drip and trouble swallowing.   Eyes:  Negative for pain and visual disturbance.  Respiratory:  Negative for cough, chest tightness and shortness of breath.   Cardiovascular:  Negative for chest pain, palpitations and leg swelling.  Gastrointestinal:  Negative for abdominal distention, abdominal pain, blood in  stool, constipation, diarrhea, nausea and vomiting.  Endocrine: Negative for cold intolerance, heat intolerance and polydipsia.  Genitourinary:  Negative for difficulty urinating, dysuria, flank pain, frequency and urgency.  Musculoskeletal:  Negative for arthralgias and joint swelling.  Skin:  Negative for color change, rash and wound.  Neurological:  Negative for dizziness, syncope, speech difficulty, weakness, light-headedness, numbness and headaches.  Hematological:  Does not bruise/bleed easily.  Psychiatric/Behavioral:  Negative for confusion, decreased concentration, dysphoric mood and sleep disturbance. The patient is not nervous/anxious.     Objective:  BP (!) 153/85   Pulse 91   Temp 97.9 F (36.6 C)   Ht '5\' 2"'$  (1.575 m)   Wt 204 lb (92.5 kg)   SpO2 99%   BMI 37.31 kg/m   BP Readings from Last 3 Encounters:  05/11/22 (!) 153/85  11/26/21 (!) 145/83  07/29/21 135/78    Wt Readings from Last 3 Encounters:  05/11/22 204 lb (92.5 kg)  11/26/21 210 lb 12.8 oz (95.6 kg)  10/21/21 210 lb 12.8 oz (95.6 kg)     Physical Exam Constitutional:      Appearance: He is well-developed.  HENT:     Head: Normocephalic and atraumatic.  Eyes:     Pupils: Pupils are equal, round, and reactive to light.  Neck:     Thyroid: No thyromegaly.     Trachea: No tracheal deviation.  Cardiovascular:     Rate and Rhythm: Normal rate and regular rhythm.     Heart sounds: Normal heart sounds. No murmur  heard.    No friction rub. No gallop.  Pulmonary:     Breath sounds: Normal breath sounds. No wheezing or rales.  Abdominal:     General: Bowel sounds are normal. There is no distension.     Palpations: Abdomen is soft. There is no mass.     Tenderness: There is no abdominal tenderness.     Hernia: There is no hernia in the left inguinal area.  Genitourinary:    Penis: Normal.      Testes: Normal.  Musculoskeletal:        General: Normal range of motion.     Cervical back: Normal  range of motion.  Lymphadenopathy:     Cervical: No cervical adenopathy.  Skin:    General: Skin is warm and dry.  Neurological:     Mental Status: He is alert and oriented to person, place, and time.       Assessment & Plan:   Clay was seen today for annual exam.  Diagnoses and all orders for this visit:  Primary hypertension -     CBC with Differential/Platelet -     CMP14+EGFR  Mixed hyperlipidemia -     Lipid panel  Vitamin D deficiency -     VITAMIN D 25 Hydroxy (Vit-D Deficiency, Fractures)  Well adult exam -     CBC with Differential/Platelet -     CMP14+EGFR -     Lipid panel -     Urinalysis -     VITAMIN D 25 Hydroxy (Vit-D Deficiency, Fractures)  Primary insomnia -     doxepin (SINEQUAN) 25 MG capsule; TAKE 1 CAPSULE BY MOUTH EVERYDAY AT BEDTIME  Essential hypertension -     hydrochlorothiazide (HYDRODIURIL) 25 MG tablet; Take 1 tablet (25 mg total) by mouth daily. -     lisinopril (ZESTRIL) 10 MG tablet; Take 1 tablet (10 mg total) by mouth daily.  Other orders -     ezetimibe (ZETIA) 10 MG tablet; Take 1 tablet (10 mg total) by mouth daily.       I have discontinued Real Cons Jr.'s cyclobenzaprine and fluticasone. I have also changed his lisinopril. Additionally, I am having him maintain his doxepin, ezetimibe, and hydrochlorothiazide. We will continue to administer sodium chloride and sodium chloride.  Allergies as of 05/11/2022       Reactions   Pravastatin    Hurting "badly"        Medication List        Accurate as of May 11, 2022 11:38 AM. If you have any questions, ask your nurse or doctor.          STOP taking these medications    cyclobenzaprine 10 MG tablet Commonly known as: FLEXERIL Stopped by: Claretta Fraise, MD   fluticasone 0.05 % cream Commonly known as: CUTIVATE Stopped by: Claretta Fraise, MD       TAKE these medications    doxepin 25 MG capsule Commonly known as: SINEQUAN TAKE 1 CAPSULE BY  MOUTH EVERYDAY AT BEDTIME   ezetimibe 10 MG tablet Commonly known as: ZETIA Take 1 tablet (10 mg total) by mouth daily.   hydrochlorothiazide 25 MG tablet Commonly known as: HYDRODIURIL Take 1 tablet (25 mg total) by mouth daily.   lisinopril 10 MG tablet Commonly known as: ZESTRIL Take 1 tablet (10 mg total) by mouth daily. What changed: additional instructions Changed by: Claretta Fraise, MD       DASH printout given  Follow-up: Return in about 6  months (around 11/09/2022).  Claretta Fraise, M.D.

## 2022-05-11 NOTE — Patient Instructions (Signed)

## 2022-05-12 ENCOUNTER — Other Ambulatory Visit: Payer: Self-pay | Admitting: *Deleted

## 2022-05-12 LAB — CBC WITH DIFFERENTIAL/PLATELET
Basophils Absolute: 0.1 10*3/uL (ref 0.0–0.2)
Basos: 1 %
EOS (ABSOLUTE): 0.1 10*3/uL (ref 0.0–0.4)
Eos: 2 %
Hematocrit: 46 % (ref 37.5–51.0)
Hemoglobin: 15.4 g/dL (ref 13.0–17.7)
Immature Grans (Abs): 0 10*3/uL (ref 0.0–0.1)
Immature Granulocytes: 1 %
Lymphocytes Absolute: 2.2 10*3/uL (ref 0.7–3.1)
Lymphs: 29 %
MCH: 30 pg (ref 26.6–33.0)
MCHC: 33.5 g/dL (ref 31.5–35.7)
MCV: 90 fL (ref 79–97)
Monocytes Absolute: 0.6 10*3/uL (ref 0.1–0.9)
Monocytes: 7 %
Neutrophils Absolute: 4.6 10*3/uL (ref 1.4–7.0)
Neutrophils: 60 %
Platelets: 265 10*3/uL (ref 150–450)
RBC: 5.14 x10E6/uL (ref 4.14–5.80)
RDW: 12.5 % (ref 11.6–15.4)
WBC: 7.7 10*3/uL (ref 3.4–10.8)

## 2022-05-12 LAB — LIPID PANEL
Chol/HDL Ratio: 5.1 ratio — ABNORMAL HIGH (ref 0.0–5.0)
Cholesterol, Total: 240 mg/dL — ABNORMAL HIGH (ref 100–199)
HDL: 47 mg/dL (ref >39)
LDL Chol Calc (NIH): 158 mg/dL — ABNORMAL HIGH (ref 0–99)
Triglycerides: 192 mg/dL — ABNORMAL HIGH (ref 0–149)
VLDL Cholesterol Cal: 35 mg/dL (ref 5–40)

## 2022-05-12 LAB — CMP14+EGFR
ALT: 56 IU/L — ABNORMAL HIGH (ref 0–44)
AST: 36 IU/L (ref 0–40)
Albumin/Globulin Ratio: 1.4 (ref 1.2–2.2)
Albumin: 4.6 g/dL (ref 3.9–4.9)
Alkaline Phosphatase: 74 IU/L (ref 44–121)
BUN/Creatinine Ratio: 15 (ref 10–24)
BUN: 17 mg/dL (ref 8–27)
Bilirubin Total: 0.3 mg/dL (ref 0.0–1.2)
CO2: 28 mmol/L (ref 20–29)
Calcium: 10.2 mg/dL (ref 8.6–10.2)
Chloride: 99 mmol/L (ref 96–106)
Creatinine, Ser: 1.11 mg/dL (ref 0.76–1.27)
Globulin, Total: 3.2 g/dL (ref 1.5–4.5)
Glucose: 126 mg/dL — ABNORMAL HIGH (ref 70–99)
Potassium: 4.2 mmol/L (ref 3.5–5.2)
Sodium: 142 mmol/L (ref 134–144)
Total Protein: 7.8 g/dL (ref 6.0–8.5)
eGFR: 74 mL/min/{1.73_m2} (ref >59)

## 2022-05-12 LAB — VITAMIN D 25 HYDROXY (VIT D DEFICIENCY, FRACTURES): Vit D, 25-Hydroxy: 16.1 ng/mL — ABNORMAL LOW (ref 30.0–100.0)

## 2022-05-12 MED ORDER — ROSUVASTATIN CALCIUM 10 MG PO TABS: 10.0000 mg | ORAL_TABLET | Freq: Every day | ORAL | 3 refills | Status: DC

## 2022-05-12 MED ORDER — VITAMIN D (ERGOCALCIFEROL) 1.25 MG (50000 UNIT) PO CAPS: 50000.0000 [IU] | ORAL_CAPSULE | ORAL | 3 refills | Status: DC

## 2022-05-12 NOTE — Progress Notes (Signed)
Dear [name]., Your Vitamin D is  low. You need a prescription strength supplement I will send that in for you.  Your cholesterol is elevated.  I would like for you to try a medication that would reduce the cholesterol and decrease your risk for heart disease.  Nurse, if at all possible, could you send in a prescription for the patient for vitamin D 50,000 units, 1 p.o. weekly #13 with 3 refills and crestor 10 mg one a day #() with one refill?? Many thanks, WS

## 2022-07-27 DIAGNOSIS — D225 Melanocytic nevi of trunk: Secondary | ICD-10-CM | POA: Diagnosis not present

## 2022-07-27 DIAGNOSIS — L820 Inflamed seborrheic keratosis: Secondary | ICD-10-CM | POA: Diagnosis not present

## 2022-07-27 DIAGNOSIS — L821 Other seborrheic keratosis: Secondary | ICD-10-CM | POA: Diagnosis not present

## 2022-07-27 DIAGNOSIS — D492 Neoplasm of unspecified behavior of bone, soft tissue, and skin: Secondary | ICD-10-CM | POA: Diagnosis not present

## 2022-07-27 DIAGNOSIS — L814 Other melanin hyperpigmentation: Secondary | ICD-10-CM | POA: Diagnosis not present

## 2022-11-09 ENCOUNTER — Encounter: Payer: Self-pay | Admitting: Family Medicine

## 2022-11-09 ENCOUNTER — Ambulatory Visit (INDEPENDENT_AMBULATORY_CARE_PROVIDER_SITE_OTHER): Payer: Medicare HMO | Admitting: Family Medicine

## 2022-11-09 VITALS — BP 157/71 | HR 82 | Temp 97.7°F | Ht 62.0 in | Wt 206.4 lb

## 2022-11-09 DIAGNOSIS — Z23 Encounter for immunization: Secondary | ICD-10-CM | POA: Diagnosis not present

## 2022-11-09 DIAGNOSIS — E669 Obesity, unspecified: Secondary | ICD-10-CM

## 2022-11-09 DIAGNOSIS — M545 Low back pain, unspecified: Secondary | ICD-10-CM | POA: Diagnosis not present

## 2022-11-09 DIAGNOSIS — I10 Essential (primary) hypertension: Secondary | ICD-10-CM | POA: Diagnosis not present

## 2022-11-09 DIAGNOSIS — E782 Mixed hyperlipidemia: Secondary | ICD-10-CM | POA: Diagnosis not present

## 2022-11-09 MED ORDER — PREDNISONE 10 MG PO TABS: ORAL_TABLET | ORAL | 0 refills | Status: DC

## 2022-11-09 MED ORDER — CYCLOBENZAPRINE HCL 10 MG PO TABS: 10.0000 mg | ORAL_TABLET | Freq: Three times a day (TID) | ORAL | 1 refills | Status: DC | PRN

## 2022-11-09 NOTE — Patient Instructions (Signed)

## 2022-11-09 NOTE — Progress Notes (Signed)
Subjective:  Patient ID: Tyler Iha., male    DOB: March 29, 1955  Age: 67 y.o. MRN: [id number]  CC: Medical Management of Chronic Issues   HPI Tyler Sampley. presents for  follow-up of hypertension. Patient has no history of headache chest pain or shortness of breath or recent cough. Patient also denies symptoms of TIA such as focal numbness or weakness. Patient denies side effects from medication. States taking it regularly.  Dced rosuvastatin when he started having back pai. Was also doing a lot of labor around the house. Now just using garlic.    History Tyler Pruitt has a past medical history of Anxiety, Arthritis, Fatty liver, GAD (generalized anxiety disorder), GERD (gastroesophageal reflux disease), Hyperlipidemia, Hypertension, IBS (irritable bowel syndrome), Low testosterone, Palpitations, Palpitations, and Vitamin D deficiency.   He has a past surgical history that includes None; Colonoscopy; Polypectomy; and Carpal tunnel release (Right).   His family history includes Heart disease (age of onset: 93) in his father; Rheum arthritis in his mother.He reports that he quit smoking about 30 years ago. His smoking use included cigarettes. He has never been exposed to tobacco smoke. He has never used smokeless tobacco. He reports current alcohol use of about 2.0 - 3.0 standard drinks of alcohol per week. He reports that he does not use drugs.  Current Outpatient Medications on File Prior to Visit  Medication Sig Dispense Refill   doxepin (SINEQUAN) 25 MG capsule TAKE 1 CAPSULE BY MOUTH EVERYDAY AT BEDTIME 90 capsule 3   Garlic 1000 MG CAPS Take 1 capsule by mouth 2 (two) times daily at 10 AM and 5 PM.     hydrochlorothiazide (HYDRODIURIL) 25 MG tablet Take 1 tablet (25 mg total) by mouth daily. 90 tablet 3   lisinopril (ZESTRIL) 10 MG tablet Take 1 tablet (10 mg total) by mouth daily. 90 tablet 3   Vitamin D, Ergocalciferol, (DRISDOL) 1.25 MG (50000 UNIT) CAPS capsule Take 1 capsule  (50,000 Units total) by mouth every 7 (seven) days. 13 capsule 3   rosuvastatin (CRESTOR) 10 MG tablet Take 1 tablet (10 mg total) by mouth daily. (Patient not taking: Reported on 11/09/2022) 90 tablet 3   Current Facility-Administered Medications on File Prior to Visit  Medication Dose Route Frequency Provider Last Rate Last Admin   0.9 %  sodium chloride infusion  500 mL Intravenous Continuous Claudette Head T, MD       0.9 %  sodium chloride infusion  500 mL Intravenous Once Meryl Dare, MD        ROS Review of Systems  Constitutional:  Negative for fever.  Respiratory:  Negative for shortness of breath.   Cardiovascular:  Negative for chest pain.  Musculoskeletal:  Positive for arthralgias (hips, lower back) and back pain (using aleve and advil, ice, heat, TENS.).  Skin:  Negative for rash.    Objective:  BP (!) 157/71   Pulse 82   Temp 97.7 F (36.5 C)   Ht 5\' 2"  (1.575 m)   Wt 206 lb 6.4 oz (93.6 kg)   SpO2 97%   BMI 37.75 kg/m   BP Readings from Last 3 Encounters:  11/09/22 (!) 157/71  05/11/22 (!) 153/85  11/26/21 (!) 145/83    Wt Readings from Last 3 Encounters:  11/09/22 206 lb 6.4 oz (93.6 kg)  05/11/22 204 lb (92.5 kg)  11/26/21 210 lb 12.8 oz (95.6 kg)     Physical Exam Vitals reviewed.  Constitutional:  Appearance: He is well-developed.  HENT:     Head: Normocephalic and atraumatic.     Right Ear: External ear normal.     Left Ear: External ear normal.     Mouth/Throat:     Pharynx: No oropharyngeal exudate or posterior oropharyngeal erythema.  Eyes:     Pupils: Pupils are equal, round, and reactive to light.  Cardiovascular:     Rate and Rhythm: Normal rate and regular rhythm.     Heart sounds: No murmur heard. Pulmonary:     Effort: No respiratory distress.     Breath sounds: Normal breath sounds.  Musculoskeletal:        General: Tenderness (lumbar spinalis bilaterally) present.     Cervical back: Normal range of motion and neck  supple.  Neurological:     Mental Status: He is alert and oriented to person, place, and time.       Assessment & Plan:   Tyler Pruitt was seen today for medical management of chronic issues.  Diagnoses and all orders for this visit:  Mixed hyperlipidemia -     Lipid panel  Primary hypertension -     CBC with Differential/Platelet -     CMP14+EGFR  Need for shingles vaccine -     Zoster Recombinant (Shingrix )  Lumbar back pain  Obesity (BMI 35.0-39.9 without comorbidity)  Other orders -     predniSONE (DELTASONE) 10 MG tablet; Take 5 daily for 2 days followed by 4,3,2 and 1 for 2 days each. -     cyclobenzaprine (FLEXERIL) 10 MG tablet; Take 1 tablet (10 mg total) by mouth 3 (three) times daily as needed for muscle spasms.   Allergies as of 11/09/2022       Reactions   Pravastatin    Hurting "badly"        Medication List        Accurate as of November 09, 2022 10:43 AM. If you have any questions, ask your nurse or doctor.          STOP taking these medications    ezetimibe 10 MG tablet Commonly known as: ZETIA Stopped by: Geovonni Meyerhoff       TAKE these medications    cyclobenzaprine 10 MG tablet Commonly known as: FLEXERIL Take 1 tablet (10 mg total) by mouth 3 (three) times daily as needed for muscle spasms. Started by: Ever Halberg   doxepin 25 MG capsule Commonly known as: SINEQUAN TAKE 1 CAPSULE BY MOUTH EVERYDAY AT BEDTIME   Garlic 1000 MG Caps Take 1 capsule by mouth 2 (two) times daily at 10 AM and 5 PM.   hydrochlorothiazide 25 MG tablet Commonly known as: HYDRODIURIL Take 1 tablet (25 mg total) by mouth daily.   lisinopril 10 MG tablet Commonly known as: ZESTRIL Take 1 tablet (10 mg total) by mouth daily.   predniSONE 10 MG tablet Commonly known as: DELTASONE Take 5 daily for 2 days followed by 4,3,2 and 1 for 2 days each. Started by: Rosina Cressler   rosuvastatin 10 MG tablet Commonly known as: Crestor Take 1 tablet (10 mg  total) by mouth daily.   Vitamin D (Ergocalciferol) 1.25 MG (50000 UNIT) Caps capsule Commonly known as: DRISDOL Take 1 capsule (50,000 Units total) by mouth every 7 (seven) days.        Meds ordered this encounter  Medications   predniSONE (DELTASONE) 10 MG tablet    Sig: Take 5 daily for 2 days followed by 4,3,2 and 1 for  2 days each.    Dispense:  30 tablet    Refill:  0   cyclobenzaprine (FLEXERIL) 10 MG tablet    Sig: Take 1 tablet (10 mg total) by mouth 3 (three) times daily as needed for muscle spasms.    Dispense:  90 tablet    Refill:  1    Pt. Will rrestart rosuvastatin if the Garlic hasn't brought cholesterol to goal. Continue OTC NSAIDS since scrips weren't effective. Monitor for GI distress. [place] labs for renal, hepatic changes  Follow-up: Return in about 6 months (around 05/12/2023).  Mechele Claude, M.D.

## 2022-11-10 LAB — CBC WITH DIFFERENTIAL/PLATELET
Basophils Absolute: 0.1 10*3/uL (ref 0.0–0.2)
Basos: 1 %
EOS (ABSOLUTE): 0.2 10*3/uL (ref 0.0–0.4)
Eos: 3 %
Hematocrit: 42.9 % (ref 37.5–51.0)
Hemoglobin: 13.9 g/dL (ref 13.0–17.7)
Immature Grans (Abs): 0.1 10*3/uL (ref 0.0–0.1)
Immature Granulocytes: 1 %
Lymphocytes Absolute: 1.6 10*3/uL (ref 0.7–3.1)
Lymphs: 26 %
MCH: 29.3 pg (ref 26.6–33.0)
MCHC: 32.4 g/dL (ref 31.5–35.7)
MCV: 91 fL (ref 79–97)
Monocytes Absolute: 0.5 10*3/uL (ref 0.1–0.9)
Monocytes: 9 %
Neutrophils Absolute: 3.6 10*3/uL (ref 1.4–7.0)
Neutrophils: 60 %
Platelets: 222 10*3/uL (ref 150–450)
RBC: 4.74 x10E6/uL (ref 4.14–5.80)
RDW: 12.9 % (ref 11.6–15.4)
WBC: 6 10*3/uL (ref 3.4–10.8)

## 2022-11-10 LAB — CMP14+EGFR
ALT: 47 IU/L — ABNORMAL HIGH (ref 0–44)
AST: 29 IU/L (ref 0–40)
Albumin: 4.4 g/dL (ref 3.9–4.9)
Alkaline Phosphatase: 75 IU/L (ref 44–121)
BUN/Creatinine Ratio: 17 (ref 10–24)
BUN: 17 mg/dL (ref 8–27)
Bilirubin Total: 0.2 mg/dL (ref 0.0–1.2)
CO2: 26 mmol/L (ref 20–29)
Calcium: 9.1 mg/dL (ref 8.6–10.2)
Chloride: 102 mmol/L (ref 96–106)
Creatinine, Ser: 1 mg/dL (ref 0.76–1.27)
Globulin, Total: 2.8 g/dL (ref 1.5–4.5)
Glucose: 138 mg/dL — ABNORMAL HIGH (ref 70–99)
Potassium: 4.5 mmol/L (ref 3.5–5.2)
Sodium: 142 mmol/L (ref 134–144)
Total Protein: 7.2 g/dL (ref 6.0–8.5)
eGFR: 84 mL/min/{1.73_m2} (ref >59)

## 2022-11-10 LAB — LIPID PANEL
Chol/HDL Ratio: 5 ratio (ref 0.0–5.0)
Cholesterol, Total: 226 mg/dL — ABNORMAL HIGH (ref 100–199)
HDL: 45 mg/dL (ref >39)
LDL Chol Calc (NIH): 160 mg/dL — ABNORMAL HIGH (ref 0–99)
Triglycerides: 116 mg/dL (ref 0–149)
VLDL Cholesterol Cal: 21 mg/dL (ref 5–40)

## 2022-12-29 ENCOUNTER — Ambulatory Visit (INDEPENDENT_AMBULATORY_CARE_PROVIDER_SITE_OTHER): Payer: Medicare HMO

## 2022-12-29 DIAGNOSIS — Z23 Encounter for immunization: Secondary | ICD-10-CM | POA: Diagnosis not present

## 2023-02-01 ENCOUNTER — Telehealth: Payer: Self-pay | Admitting: Family Medicine

## 2023-02-01 DIAGNOSIS — D225 Melanocytic nevi of trunk: Secondary | ICD-10-CM | POA: Diagnosis not present

## 2023-02-01 DIAGNOSIS — L570 Actinic keratosis: Secondary | ICD-10-CM | POA: Diagnosis not present

## 2023-02-01 DIAGNOSIS — L820 Inflamed seborrheic keratosis: Secondary | ICD-10-CM | POA: Diagnosis not present

## 2023-02-01 DIAGNOSIS — L821 Other seborrheic keratosis: Secondary | ICD-10-CM | POA: Diagnosis not present

## 2023-02-01 DIAGNOSIS — L538 Other specified erythematous conditions: Secondary | ICD-10-CM | POA: Diagnosis not present

## 2023-02-01 DIAGNOSIS — L814 Other melanin hyperpigmentation: Secondary | ICD-10-CM | POA: Diagnosis not present

## 2023-02-20 ENCOUNTER — Other Ambulatory Visit: Payer: Self-pay | Admitting: Family Medicine

## 2023-03-15 ENCOUNTER — Ambulatory Visit: Payer: Medicare HMO | Admitting: Family Medicine

## 2023-03-15 ENCOUNTER — Other Ambulatory Visit: Payer: Self-pay | Admitting: Family Medicine

## 2023-03-15 ENCOUNTER — Encounter: Payer: Self-pay | Admitting: Family Medicine

## 2023-03-15 VITALS — Temp 97.7°F | Ht 62.0 in | Wt 186.6 lb

## 2023-03-15 DIAGNOSIS — E756 Lipid storage disorder, unspecified: Secondary | ICD-10-CM

## 2023-03-15 DIAGNOSIS — Z7984 Long term (current) use of oral hypoglycemic drugs: Secondary | ICD-10-CM | POA: Diagnosis not present

## 2023-03-15 DIAGNOSIS — R3589 Other polyuria: Secondary | ICD-10-CM | POA: Diagnosis not present

## 2023-03-15 DIAGNOSIS — I10 Essential (primary) hypertension: Secondary | ICD-10-CM

## 2023-03-15 DIAGNOSIS — R000 Tachycardia, unspecified: Secondary | ICD-10-CM

## 2023-03-15 DIAGNOSIS — E1169 Type 2 diabetes mellitus with other specified complication: Secondary | ICD-10-CM | POA: Diagnosis not present

## 2023-03-15 LAB — URINALYSIS, COMPLETE
Bilirubin, UA: NEGATIVE
Leukocytes,UA: NEGATIVE
Nitrite, UA: NEGATIVE
Protein,UA: NEGATIVE
Specific Gravity, UA: 1.01 (ref 1.005–1.030)
Urobilinogen, Ur: 0.2 mg/dL (ref 0.2–1.0)
pH, UA: 5 (ref 5.0–7.5)

## 2023-03-15 LAB — MICROSCOPIC EXAMINATION
Epithelial Cells (non renal): NONE SEEN /[HPF] (ref 0–10)
RBC, Urine: NONE SEEN /[HPF] (ref 0–2)
Renal Epithel, UA: NONE SEEN /[HPF]
WBC, UA: NONE SEEN /[HPF] (ref 0–5)
Yeast, UA: NONE SEEN

## 2023-03-15 LAB — BAYER DCA HB A1C WAIVED: HB A1C (BAYER DCA - WAIVED): 13.7 % — ABNORMAL HIGH (ref 4.8–5.6)

## 2023-03-15 LAB — HEMOGLOBIN, FINGERSTICK: Hemoglobin: 15.1 g/dL (ref 12.6–17.7)

## 2023-03-15 LAB — GLUCOSE HEMOCUE WAIVED: Glu Hemocue Waived: 444 mg/dL — ABNORMAL HIGH (ref 70–99)

## 2023-03-15 MED ORDER — LANCETS MISC. MISC: 1.0000 | Freq: Three times a day (TID) | 0 refills | Status: DC

## 2023-03-15 MED ORDER — LANCET DEVICE MISC: 1.0000 | Freq: Three times a day (TID) | 0 refills | Status: AC

## 2023-03-15 MED ORDER — METFORMIN HCL ER 500 MG PO TB24: 500.0000 mg | ORAL_TABLET | Freq: Two times a day (BID) | ORAL | 3 refills | Status: DC

## 2023-03-15 MED ORDER — BLOOD GLUCOSE MONITORING SUPPL DEVI: 1.0000 | Freq: Three times a day (TID) | 0 refills | Status: DC

## 2023-03-15 MED ORDER — BLOOD GLUCOSE TEST VI STRP: 1.0000 | ORAL_STRIP | Freq: Three times a day (TID) | 0 refills | Status: DC

## 2023-03-15 NOTE — Patient Instructions (Signed)

## 2023-03-15 NOTE — Addendum Note (Signed)
Addended by: Mechele Claude on: 03/15/2023 05:56 PM   Modules accepted: Orders

## 2023-03-15 NOTE — Progress Notes (Addendum)
Subjective:  Patient ID: Tyler Pruitt., male    DOB: 07/21/1955  Age: 67 y.o. MRN: [id number]  CC: Polyuria and Tachycardia   HPI Tyler Pruitt. presents for dry mouth, polydipsia, vision changes, polyuria. Getting worse for a week. Present for at least 3 weeks.      03/15/2023    8:02 AM 03/15/2023    7:47 AM 11/09/2022   10:01 AM  Depression screen PHQ 2/9  Decreased Interest 0 0 0  Down, Depressed, Hopeless 0 0 0  PHQ - 2 Score 0 0 0    History Tyler Pruitt has a past medical history of Anxiety, Arthritis, Fatty liver, GAD (generalized anxiety disorder), GERD (gastroesophageal reflux disease), Hyperlipidemia, Hypertension, IBS (irritable bowel syndrome), Low testosterone, Palpitations, Palpitations, and Vitamin D deficiency.   He has a past surgical history that includes None; Colonoscopy; Polypectomy; and Carpal tunnel release (Right).   His family history includes Heart disease (age of onset: 59) in his father; Rheum arthritis in his mother.He reports that he quit smoking about 31 years ago. His smoking use included cigarettes. He has never been exposed to tobacco smoke. He has never used smokeless tobacco. He reports current alcohol use of about 2.0 - 3.0 standard drinks of alcohol per week. He reports that he does not use drugs.    ROS Review of Systems  Constitutional: Negative.   HENT: Negative.  Negative for mouth sores (dry mouth present).   Eyes:  Positive for visual disturbance.  Respiratory:  Negative for cough and shortness of breath.   Cardiovascular:  Negative for chest pain and leg swelling.  Gastrointestinal:  Negative for abdominal pain, diarrhea, nausea and vomiting.  Endocrine: Positive for polydipsia, polyphagia and polyuria.  Genitourinary:  Negative for difficulty urinating.  Musculoskeletal:  Negative for arthralgias and myalgias.  Skin:  Negative for rash.  Neurological:  Positive for light-headedness. Negative for headaches.   Psychiatric/Behavioral:  Negative for sleep disturbance.     Objective:  Temp 97.7 F (36.5 C)   Ht 5\' 2"  (1.575 m)   Wt 186 lb 9.6 oz (84.6 kg)   SpO2 96%   BMI 34.13 kg/m   BP Readings from Last 3 Encounters:  11/09/22 (!) 157/71  05/11/22 (!) 153/85  11/26/21 (!) 145/83    Wt Readings from Last 3 Encounters:  03/15/23 186 lb 9.6 oz (84.6 kg)  11/09/22 206 lb 6.4 oz (93.6 kg)  05/11/22 204 lb (92.5 kg)     Physical Exam Vitals reviewed.  Constitutional:      Appearance: He is well-developed.  HENT:     Head: Normocephalic and atraumatic.     Right Ear: External ear normal.     Left Ear: External ear normal.     Mouth/Throat:     Pharynx: No oropharyngeal exudate or posterior oropharyngeal erythema.  Eyes:     Pupils: Pupils are equal, round, and reactive to light.  Cardiovascular:     Rate and Rhythm: Normal rate and regular rhythm.     Heart sounds: No murmur heard. Pulmonary:     Effort: No respiratory distress.     Breath sounds: Normal breath sounds.  Musculoskeletal:     Cervical back: Normal range of motion and neck supple.  Neurological:     Mental Status: He is alert and oriented to person, place, and time.       Assessment & Plan:   Tyler Pruitt was seen today for polyuria and tachycardia.  Diagnoses and all orders for  this visit:  Polyuria -     Urinalysis, Complete; Future -     Urine Culture; Future -     Urine Culture -     Urinalysis, Complete -     Glucose Hemocue Waived -     Hemoglobin, fingerstick -     CMP14+EGFR -     CBC with Differential/Platelet -     Bayer DCA Hb A1c Waived -     Amb ref to Medical Nutrition Therapy-MNT  Tachycardia -     EKG 12-Lead -     Glucose Hemocue Waived -     Hemoglobin, fingerstick -     CMP14+EGFR -     CBC with Differential/Platelet -     Bayer DCA Hb A1c Waived -     Amb ref to Medical Nutrition Therapy-MNT  Diabetic lipidosis (HCC) -     CMP14+EGFR -     CBC with  Differential/Platelet -     Bayer DCA Hb A1c Waived -     Amb ref to Medical Nutrition Therapy-MNT  Other orders -     metFORMIN (GLUCOPHAGE-XR) 500 MG 24 hr tablet; Take 1 tablet (500 mg total) by mouth 2 (two) times daily with a meal. -     Microscopic Examination -     Blood Glucose Monitoring Suppl DEVI; 1 each by Does not apply route in the morning, at noon, and at bedtime. May substitute to any manufacturer covered by patient's insurance. -     Glucose Blood (BLOOD GLUCOSE TEST STRIPS) STRP; 1 each by In Vitro route in the morning, at noon, and at bedtime. May substitute to any manufacturer covered by patient's insurance. -     Lancet Device MISC; 1 each by Does not apply route in the morning, at noon, and at bedtime. May substitute to any manufacturer covered by patient's insurance. -     Lancets Misc. MISC; 1 each by Does not apply route in the morning, at noon, and at bedtime. May substitute to any manufacturer covered by patient's insurance.       I have discontinued Tyler Pruitt predniSONE. I am also having him start on metFORMIN, Blood Glucose Monitoring Suppl, BLOOD GLUCOSE TEST STRIPS, Lancet Device, and Lancets Misc.. Additionally, I am having him maintain his doxepin, hydrochlorothiazide, lisinopril, Vitamin D (Ergocalciferol), rosuvastatin, Garlic, and cyclobenzaprine. We will stop administering sodium chloride and sodium chloride.  Allergies as of 03/15/2023       Reactions   Pravastatin    Hurting "badly"        Medication List        Accurate as of March 15, 2023  5:56 PM. If you have any questions, ask your nurse or doctor.          STOP taking these medications    predniSONE 10 MG tablet Commonly known as: DELTASONE Stopped by: Tyler Pruitt       TAKE these medications    Blood Glucose Monitoring Suppl Devi 1 each by Does not apply route in the morning, at noon, and at bedtime. May substitute to any manufacturer covered by patient's  insurance. Started by: Tyler Pruitt   BLOOD GLUCOSE TEST STRIPS Strp 1 each by In Vitro route in the morning, at noon, and at bedtime. May substitute to any manufacturer covered by patient's insurance. Started by: [name]   cyclobenzaprine 10 MG tablet Commonly known as: FLEXERIL TAKE 1 TABLET BY MOUTH THREE TIMES A DAY AS NEEDED FOR MUSCLE  SPASMS   doxepin 25 MG capsule Commonly known as: SINEQUAN TAKE 1 CAPSULE BY MOUTH EVERYDAY AT BEDTIME   Garlic 1000 MG Caps Take 1 capsule by mouth 2 (two) times daily at 10 AM and 5 PM.   hydrochlorothiazide 25 MG tablet Commonly known as: HYDRODIURIL Take 1 tablet (25 mg total) by mouth daily.   Lancet Device Misc 1 each by Does not apply route in the morning, at noon, and at bedtime. May substitute to any manufacturer covered by patient's insurance. Started by: Kaaliyah Kita   [employer] Misc 1 each by Does not apply route in the morning, at noon, and at bedtime. May substitute to any manufacturer covered by patient's insurance. Started by: Trampas Stettner   lisinopril 10 MG tablet Commonly known as: ZESTRIL Take 1 tablet (10 mg total) by mouth daily.   metFORMIN 500 MG 24 hr tablet Commonly known as: GLUCOPHAGE-XR Take 1 tablet (500 mg total) by mouth 2 (two) times daily with a meal. Started by: Cylan Borum   rosuvastatin 10 MG tablet Commonly known as: Crestor Take 1 tablet (10 mg total) by mouth daily.   Vitamin D (Ergocalciferol) 1.25 MG (50000 UNIT) Caps capsule Commonly known as: DRISDOL Take 1 capsule (50,000 Units total) by mouth every 7 (seven) days.         Follow-up: Return in about 2 weeks (around 03/29/2023) for diabetes.  Mechele Claude, M.D.

## 2023-03-16 ENCOUNTER — Other Ambulatory Visit: Payer: Self-pay

## 2023-03-16 ENCOUNTER — Inpatient Hospital Stay (HOSPITAL_COMMUNITY)
Admission: EM | Admit: 2023-03-16 | Discharge: 2023-03-20 | DRG: 638 | Disposition: A | Discharge status: Home / Self Care | Payer: Medicare HMO | Attending: Family Medicine | Admitting: Family Medicine

## 2023-03-16 ENCOUNTER — Telehealth: Payer: Self-pay | Admitting: Family Medicine

## 2023-03-16 ENCOUNTER — Telehealth: Payer: Self-pay

## 2023-03-16 ENCOUNTER — Encounter (HOSPITAL_COMMUNITY): Payer: Self-pay | Admitting: Emergency Medicine

## 2023-03-16 DIAGNOSIS — N179 Acute kidney failure, unspecified: Secondary | ICD-10-CM | POA: Diagnosis present

## 2023-03-16 DIAGNOSIS — K760 Fatty (change of) liver, not elsewhere classified: Secondary | ICD-10-CM | POA: Diagnosis present

## 2023-03-16 DIAGNOSIS — E119 Type 2 diabetes mellitus without complications: Secondary | ICD-10-CM

## 2023-03-16 DIAGNOSIS — E1110 Type 2 diabetes mellitus with ketoacidosis without coma: Principal | ICD-10-CM

## 2023-03-16 DIAGNOSIS — Z87891 Personal history of nicotine dependence: Secondary | ICD-10-CM

## 2023-03-16 DIAGNOSIS — E66811 Obesity, class 1: Secondary | ICD-10-CM | POA: Diagnosis present

## 2023-03-16 DIAGNOSIS — N189 Chronic kidney disease, unspecified: Secondary | ICD-10-CM | POA: Diagnosis not present

## 2023-03-16 DIAGNOSIS — F411 Generalized anxiety disorder: Secondary | ICD-10-CM | POA: Diagnosis present

## 2023-03-16 DIAGNOSIS — E782 Mixed hyperlipidemia: Secondary | ICD-10-CM | POA: Diagnosis not present

## 2023-03-16 DIAGNOSIS — E785 Hyperlipidemia, unspecified: Secondary | ICD-10-CM | POA: Diagnosis present

## 2023-03-16 DIAGNOSIS — R739 Hyperglycemia, unspecified: Secondary | ICD-10-CM | POA: Diagnosis not present

## 2023-03-16 DIAGNOSIS — E875 Hyperkalemia: Secondary | ICD-10-CM | POA: Diagnosis present

## 2023-03-16 DIAGNOSIS — R040 Epistaxis: Secondary | ICD-10-CM | POA: Diagnosis not present

## 2023-03-16 DIAGNOSIS — Z7984 Long term (current) use of oral hypoglycemic drugs: Secondary | ICD-10-CM

## 2023-03-16 DIAGNOSIS — Z6834 Body mass index (BMI) 34.0-34.9, adult: Secondary | ICD-10-CM

## 2023-03-16 DIAGNOSIS — Z8261 Family history of arthritis: Secondary | ICD-10-CM

## 2023-03-16 DIAGNOSIS — E559 Vitamin D deficiency, unspecified: Secondary | ICD-10-CM | POA: Diagnosis present

## 2023-03-16 DIAGNOSIS — E1100 Type 2 diabetes mellitus with hyperosmolarity without nonketotic hyperglycemic-hyperosmolar coma (NKHHC): Principal | ICD-10-CM | POA: Diagnosis present

## 2023-03-16 DIAGNOSIS — D72829 Elevated white blood cell count, unspecified: Secondary | ICD-10-CM | POA: Diagnosis not present

## 2023-03-16 DIAGNOSIS — R350 Frequency of micturition: Secondary | ICD-10-CM | POA: Diagnosis present

## 2023-03-16 DIAGNOSIS — E860 Dehydration: Secondary | ICD-10-CM | POA: Diagnosis present

## 2023-03-16 DIAGNOSIS — Z8249 Family history of ischemic heart disease and other diseases of the circulatory system: Secondary | ICD-10-CM

## 2023-03-16 DIAGNOSIS — K219 Gastro-esophageal reflux disease without esophagitis: Secondary | ICD-10-CM | POA: Diagnosis present

## 2023-03-16 DIAGNOSIS — Z79899 Other long term (current) drug therapy: Secondary | ICD-10-CM

## 2023-03-16 DIAGNOSIS — I10 Essential (primary) hypertension: Secondary | ICD-10-CM | POA: Diagnosis present

## 2023-03-16 DIAGNOSIS — E669 Obesity, unspecified: Secondary | ICD-10-CM | POA: Diagnosis present

## 2023-03-16 HISTORY — DX: Type 2 diabetes mellitus without complications: E11.9

## 2023-03-16 LAB — CBC WITH DIFFERENTIAL/PLATELET
Abs Immature Granulocytes: 0.06 10*3/uL (ref 0.00–0.07)
Basophils Absolute: 0.1 10*3/uL (ref 0.0–0.2)
Basophils Absolute: 0.1 10*3/uL (ref 0.0–0.1)
Basophils Relative: 1 %
Basos: 1 %
EOS (ABSOLUTE): 0.1 10*3/uL (ref 0.0–0.4)
Eos: 1 %
Eosinophils Absolute: 0.1 10*3/uL (ref 0.0–0.5)
Eosinophils Relative: 1 %
HCT: 44.8 % (ref 39.0–52.0)
Hematocrit: 46.2 % (ref 37.5–51.0)
Hemoglobin: 15.1 g/dL (ref 13.0–17.7)
Hemoglobin: 15.7 g/dL (ref 13.0–17.0)
Immature Grans (Abs): 0.1 10*3/uL (ref 0.0–0.1)
Immature Granulocytes: 1 %
Immature Granulocytes: 1 %
Lymphocytes Absolute: 2.3 10*3/uL (ref 0.7–3.1)
Lymphocytes Relative: 19 %
Lymphs Abs: 2.1 10*3/uL (ref 0.7–4.0)
Lymphs: 22 %
MCH: 30 pg (ref 26.6–33.0)
MCH: 30.3 pg (ref 26.0–34.0)
MCHC: 32.7 g/dL (ref 31.5–35.7)
MCHC: 35 g/dL (ref 30.0–36.0)
MCV: 92 fL (ref 79–97)
MCV: 86.5 fL (ref 80.0–100.0)
Monocytes Absolute: 0.6 10*3/uL (ref 0.1–0.9)
Monocytes Absolute: 0.6 10*3/uL (ref 0.1–1.0)
Monocytes Relative: 6 %
Monocytes: 6 %
Neutro Abs: 7.9 10*3/uL — ABNORMAL HIGH (ref 1.7–7.7)
Neutrophils Absolute: 7.2 10*3/uL — ABNORMAL HIGH (ref 1.4–7.0)
Neutrophils Relative %: 72 %
Neutrophils: 69 %
Platelets: 258 10*3/uL (ref 150–450)
Platelets: 248 10*3/uL (ref 150–400)
RBC: 5.03 x10E6/uL (ref 4.14–5.80)
RBC: 5.18 MIL/uL (ref 4.22–5.81)
RDW: 11.7 % (ref 11.6–15.4)
RDW: 11.9 % (ref 11.5–15.5)
WBC: 10.3 10*3/uL (ref 3.4–10.8)
WBC: 10.8 10*3/uL — ABNORMAL HIGH (ref 4.0–10.5)
nRBC: 0 % (ref 0.0–0.2)

## 2023-03-16 LAB — I-STAT VENOUS BLOOD GAS, ED
Acid-base deficit: 5 mmol/L — ABNORMAL HIGH (ref 0.0–2.0)
Bicarbonate: 20.2 mmol/L (ref 20.0–28.0)
Calcium, Ion: 1.23 mmol/L (ref 1.15–1.40)
HCT: 46 % (ref 39.0–52.0)
Hemoglobin: 15.6 g/dL (ref 13.0–17.0)
O2 Saturation: 95 %
Potassium: 4.3 mmol/L (ref 3.5–5.1)
Sodium: 129 mmol/L — ABNORMAL LOW (ref 135–145)
TCO2: 21 mmol/L — ABNORMAL LOW (ref 22–32)
pCO2, Ven: 36.1 mm[Hg] — ABNORMAL LOW (ref 44–60)
pH, Ven: 7.355 (ref 7.25–7.43)
pO2, Ven: 76 mm[Hg] — ABNORMAL HIGH (ref 32–45)

## 2023-03-16 LAB — CMP14+EGFR
ALT: 54 IU/L — ABNORMAL HIGH (ref 0–44)
AST: 28 IU/L (ref 0–40)
Albumin: 4.7 g/dL (ref 3.9–4.9)
Alkaline Phosphatase: 118 IU/L (ref 44–121)
BUN/Creatinine Ratio: 18 (ref 10–24)
BUN: 24 mg/dL (ref 8–27)
Bilirubin Total: 0.5 mg/dL (ref 0.0–1.2)
CO2: 23 mmol/L (ref 20–29)
Calcium: 10.1 mg/dL (ref 8.6–10.2)
Chloride: 86 mmol/L — ABNORMAL LOW (ref 96–106)
Creatinine, Ser: 1.36 mg/dL — ABNORMAL HIGH (ref 0.76–1.27)
Globulin, Total: 3.4 g/dL (ref 1.5–4.5)
Glucose: 513 mg/dL (ref 70–99)
Potassium: 5.6 mmol/L — ABNORMAL HIGH (ref 3.5–5.2)
Sodium: 127 mmol/L — ABNORMAL LOW (ref 134–144)
Total Protein: 8.1 g/dL (ref 6.0–8.5)
eGFR: 58 mL/min/{1.73_m2} — ABNORMAL LOW (ref >59)

## 2023-03-16 LAB — BASIC METABOLIC PANEL
Anion gap: 11 (ref 5–15)
BUN: 21 mg/dL (ref 8–23)
CO2: 22 mmol/L (ref 22–32)
Calcium: 8.6 mg/dL — ABNORMAL LOW (ref 8.9–10.3)
Chloride: 102 mmol/L (ref 98–111)
Creatinine, Ser: 0.97 mg/dL (ref 0.61–1.24)
GFR, Estimated: >60 mL/min (ref >60)
Glucose, Bld: 230 mg/dL — ABNORMAL HIGH (ref 70–99)
Potassium: 4 mmol/L (ref 3.5–5.1)
Sodium: 135 mmol/L (ref 135–145)

## 2023-03-16 LAB — URINALYSIS, ROUTINE W REFLEX MICROSCOPIC
Bacteria, UA: NONE SEEN
Bilirubin Urine: NEGATIVE
Glucose, UA: >=500 mg/dL — AB
Hgb urine dipstick: NEGATIVE
Ketones, ur: 80 mg/dL — AB
Leukocytes,Ua: NEGATIVE
Nitrite: NEGATIVE
Protein, ur: NEGATIVE mg/dL
Specific Gravity, Urine: 1.021 (ref 1.005–1.030)
pH: 5 (ref 5.0–8.0)

## 2023-03-16 LAB — GLUCOSE, CAPILLARY
Glucose-Capillary: 296 mg/dL — ABNORMAL HIGH (ref 70–99)
Glucose-Capillary: 302 mg/dL — ABNORMAL HIGH (ref 70–99)
Glucose-Capillary: 248 mg/dL — ABNORMAL HIGH (ref 70–99)

## 2023-03-16 LAB — COMPREHENSIVE METABOLIC PANEL
ALT: 53 U/L — ABNORMAL HIGH (ref 0–44)
AST: 33 U/L (ref 15–41)
Albumin: 4 g/dL (ref 3.5–5.0)
Alkaline Phosphatase: 82 U/L (ref 38–126)
Anion gap: 14 (ref 5–15)
BUN: 27 mg/dL — ABNORMAL HIGH (ref 8–23)
CO2: 22 mmol/L (ref 22–32)
Calcium: 10.2 mg/dL (ref 8.9–10.3)
Chloride: 93 mmol/L — ABNORMAL LOW (ref 98–111)
Creatinine, Ser: 1.31 mg/dL — ABNORMAL HIGH (ref 0.61–1.24)
GFR, Estimated: >60 mL/min (ref >60)
Glucose, Bld: 461 mg/dL — ABNORMAL HIGH (ref 70–99)
Potassium: 5.3 mmol/L — ABNORMAL HIGH (ref 3.5–5.1)
Sodium: 129 mmol/L — ABNORMAL LOW (ref 135–145)
Total Bilirubin: 0.9 mg/dL (ref 0.0–1.2)
Total Protein: 8.1 g/dL (ref 6.5–8.1)

## 2023-03-16 LAB — BETA-HYDROXYBUTYRIC ACID
Beta-Hydroxybutyric Acid: 3.95 mmol/L — ABNORMAL HIGH (ref 0.05–0.27)
Beta-Hydroxybutyric Acid: 0.84 mmol/L — ABNORMAL HIGH (ref 0.05–0.27)

## 2023-03-16 LAB — CBG MONITORING, ED
Glucose-Capillary: 404 mg/dL — ABNORMAL HIGH (ref 70–99)
Glucose-Capillary: 268 mg/dL — ABNORMAL HIGH (ref 70–99)

## 2023-03-16 LAB — HIV ANTIBODY (ROUTINE TESTING W REFLEX): HIV Screen 4th Generation wRfx: NONREACTIVE

## 2023-03-16 LAB — OSMOLALITY: Osmolality: 316 mosm/kg — ABNORMAL HIGH (ref 275–295)

## 2023-03-16 LAB — TSH: TSH: 3.81 u[IU]/mL (ref 0.350–4.500)

## 2023-03-16 MED ORDER — INSULIN GLARGINE-YFGN 100 UNIT/ML ~~LOC~~ SOLN
10.0000 [IU] | Freq: Every day | SUBCUTANEOUS | Status: DC
  Administered 2023-03-16 – 2023-03-17 (×2): 10 [IU] via SUBCUTANEOUS
  Filled 2023-03-16 (×2): qty 0.1

## 2023-03-16 MED ORDER — ENOXAPARIN SODIUM 40 MG/0.4ML IJ SOSY
40.0000 mg | PREFILLED_SYRINGE | INTRAMUSCULAR | Status: DC
  Administered 2023-03-16 – 2023-03-18 (×3): 40 mg via SUBCUTANEOUS
  Filled 2023-03-16 (×4): qty 0.4

## 2023-03-16 MED ORDER — ONDANSETRON HCL 4 MG PO TABS: 4.0000 mg | ORAL_TABLET | Freq: Four times a day (QID) | ORAL | Status: DC | PRN

## 2023-03-16 MED ORDER — HYDRALAZINE HCL 20 MG/ML IJ SOLN: 10.0000 mg | INTRAMUSCULAR | Status: DC | PRN

## 2023-03-16 MED ORDER — INSULIN STARTER KIT- PEN NEEDLES (ENGLISH)
1.0000 | Freq: Once | Status: AC
  Administered 2023-03-16: 1
  Filled 2023-03-16 (×2): qty 1

## 2023-03-16 MED ORDER — ROSUVASTATIN CALCIUM 5 MG PO TABS
10.0000 mg | ORAL_TABLET | Freq: Every day | ORAL | Status: DC
  Administered 2023-03-16 – 2023-03-20 (×5): 10 mg via ORAL
  Filled 2023-03-16 (×5): qty 2

## 2023-03-16 MED ORDER — LACTATED RINGERS IV BOLUS
20.0000 mL/kg | Freq: Once | INTRAVENOUS | Status: AC
  Administered 2023-03-16: 1692 mL via INTRAVENOUS

## 2023-03-16 MED ORDER — SODIUM CHLORIDE 0.9% FLUSH
3.0000 mL | Freq: Two times a day (BID) | INTRAVENOUS | Status: DC
  Administered 2023-03-16 – 2023-03-20 (×9): 3 mL via INTRAVENOUS

## 2023-03-16 MED ORDER — ALBUTEROL SULFATE (2.5 MG/3ML) 0.083% IN NEBU: 2.5000 mg | INHALATION_SOLUTION | Freq: Four times a day (QID) | RESPIRATORY_TRACT | Status: DC | PRN

## 2023-03-16 MED ORDER — ACETAMINOPHEN 325 MG PO TABS: 650.0000 mg | ORAL_TABLET | Freq: Four times a day (QID) | ORAL | Status: DC | PRN

## 2023-03-16 MED ORDER — INSULIN ASPART 100 UNIT/ML IJ SOLN: 0.0000 [IU] | Freq: Every day | INTRAMUSCULAR | Status: DC

## 2023-03-16 MED ORDER — DEXTROSE IN LACTATED RINGERS 5 % IV SOLN: INTRAVENOUS | Status: DC

## 2023-03-16 MED ORDER — ACETAMINOPHEN 650 MG RE SUPP: 650.0000 mg | Freq: Four times a day (QID) | RECTAL | Status: DC | PRN

## 2023-03-16 MED ORDER — LACTATED RINGERS IV SOLN: INTRAVENOUS | Status: DC

## 2023-03-16 MED ORDER — INSULIN ASPART 100 UNIT/ML IJ SOLN
4.0000 [IU] | Freq: Once | INTRAMUSCULAR | Status: AC
  Administered 2023-03-16: 4 [IU] via SUBCUTANEOUS

## 2023-03-16 MED ORDER — INSULIN ASPART 100 UNIT/ML IJ SOLN
0.0000 [IU] | Freq: Three times a day (TID) | INTRAMUSCULAR | Status: DC
  Administered 2023-03-16: 3 [IU] via SUBCUTANEOUS
  Administered 2023-03-16: 5 [IU] via SUBCUTANEOUS

## 2023-03-16 MED ORDER — SODIUM CHLORIDE 0.9 % IV SOLN: INTRAVENOUS | Status: DC

## 2023-03-16 MED ORDER — SODIUM CHLORIDE 0.9 % IV SOLN
INTRAVENOUS | Status: AC
Start: 2023-03-16 — End: 2023-03-16

## 2023-03-16 MED ORDER — INSULIN ASPART 100 UNIT/ML IJ SOLN: 0.0000 [IU] | Freq: Three times a day (TID) | INTRAMUSCULAR | Status: DC

## 2023-03-16 MED ORDER — INSULIN REGULAR(HUMAN) IN NACL 100-0.9 UT/100ML-% IV SOLN
INTRAVENOUS | Status: DC
  Administered 2023-03-16: 13 [IU]/h via INTRAVENOUS
  Filled 2023-03-16: qty 100

## 2023-03-16 MED ORDER — ONDANSETRON HCL 4 MG/2ML IJ SOLN: 4.0000 mg | Freq: Four times a day (QID) | INTRAMUSCULAR | Status: DC | PRN

## 2023-03-16 MED ORDER — INSULIN ASPART 100 UNIT/ML IJ SOLN
0.0000 [IU] | Freq: Three times a day (TID) | INTRAMUSCULAR | Status: DC
Start: 2023-03-17 — End: 2023-03-19
  Administered 2023-03-17: 11 [IU] via SUBCUTANEOUS
  Administered 2023-03-17: 5 [IU] via SUBCUTANEOUS
  Administered 2023-03-17: 15 [IU] via SUBCUTANEOUS
  Administered 2023-03-18: 5 [IU] via SUBCUTANEOUS
  Administered 2023-03-18: 8 [IU] via SUBCUTANEOUS
  Administered 2023-03-18: 5 [IU] via SUBCUTANEOUS

## 2023-03-16 MED ORDER — DOXEPIN HCL 25 MG PO CAPS
25.0000 mg | ORAL_CAPSULE | Freq: Every day | ORAL | Status: DC
  Administered 2023-03-16 – 2023-03-19 (×4): 25 mg via ORAL
  Filled 2023-03-16 (×5): qty 1

## 2023-03-16 MED ORDER — CYCLOBENZAPRINE HCL 10 MG PO TABS: 10.0000 mg | ORAL_TABLET | Freq: Three times a day (TID) | ORAL | Status: DC | PRN

## 2023-03-16 MED ORDER — LIVING WELL WITH DIABETES BOOK
Freq: Once | Status: AC
  Filled 2023-03-16: qty 1

## 2023-03-16 MED ORDER — DEXTROSE 50 % IV SOLN: 0.0000 mL | INTRAVENOUS | Status: DC | PRN

## 2023-03-16 MED ORDER — POTASSIUM CHLORIDE 10 MEQ/100ML IV SOLN
10.0000 meq | INTRAVENOUS | Status: DC
Start: 2023-03-16 — End: 2023-03-16
  Administered 2023-03-16: 10 meq via INTRAVENOUS
  Filled 2023-03-16: qty 100

## 2023-03-16 NOTE — ED Notes (Signed)
 ED TO INPATIENT HANDOFF REPORT  ED Nurse Name and Phone #: Lorenza [phone]  S Name/Age/Gender Tyler Pruitt. 67 y.o. male Room/Bed: 002C/002C  Code Status   Code Status: Full Code  Home/SNF/Other Home Patient oriented to: self, place, time, and situation Is this baseline? Yes   Triage Complete: Triage complete  Chief Complaint Hyperosmolar hyperglycemic state (HHS) (HCC) [E11.00]  Triage Note Pt reports increased urination and thirst x 3 weeks.  Seen at [facility] and told blood sugar was high yesterday.  Started on Metformin  yesterday.  No previous history of diabetes.  Reports intermittent nausea and generalized weakness.  20 lb weight loss in the last 3 weeks and blurred vision.   Allergies Allergies  Allergen Reactions   Pravastatin      Hurting badly    Level of Care/Admitting Diagnosis ED Disposition     ED Disposition  Admit   Condition  --   Comment  Hospital Area: [facility] [[id number]]  Level of Care: Telemetry Medical [104]  May place patient in observation at [facility] or [facility] if equivalent level of care is available:: No  Covid Evaluation: Asymptomatic - no recent exposure (last 10 days) testing not required  Diagnosis: Hyperosmolar hyperglycemic state (HHS) [facility]) [[id number]]  Admitting Physician: CLAUDENE MAXIMINO LABOR [[id number]]  Attending Physician: CLAUDENE MAXIMINO LABOR [[id number]]          B Medical/Surgery History Past Medical History:  Diagnosis Date   Anxiety    generalized anxiety disorder   Arthritis    BACK   Diabetes (HCC)    Fatty liver    GAD (generalized anxiety disorder)    GERD (gastroesophageal reflux disease)    Hyperlipidemia    Hypertension    IBS (irritable bowel syndrome)    Low testosterone     Palpitations    Palpitations    Vitamin D  deficiency    Past Surgical History:  Procedure Laterality Date   CARPAL TUNNEL RELEASE Right    COLONOSCOPY     None     POLYPECTOMY        A IV Location/Drains/Wounds Patient Lines/Drains/Airways Status     Active Line/Drains/Airways     Name Placement date Placement time Site Days   Peripheral IV 03/16/23 18 G 1 Right;Anterior;Upper Arm 03/16/23  1138  Arm  less than 1   Peripheral IV 03/16/23 18 G 1.88 Right;Upper;Medial Arm 03/16/23  1221  Arm  less than 1            Intake/Output Last 24 hours  Intake/Output Summary (Last 24 hours) at 03/16/2023 1805 Last data filed at 03/16/2023 1351 Gross per 24 hour  Intake 2900 ml  Output --  Net 2900 ml    Labs/Imaging Results for orders placed or performed during the hospital encounter of 03/16/23 (from the past 48 hours)  CBC with Differential     Status: Abnormal   Collection Time: 03/16/23 10:35 AM  Result Value Ref Range   WBC 10.8 (H) 4.0 - 10.5 K/uL   RBC 5.18 4.22 - 5.81 MIL/uL   Hemoglobin 15.7 13.0 - 17.0 g/dL   HCT 55.1 60.9 - 47.9 %   MCV 86.5 80.0 - 100.0 fL   MCH 30.3 26.0 - 34.0 pg   MCHC 35.0 30.0 - 36.0 g/dL   RDW 88.0 88.4 - 84.4 %   Platelets 248 150 - 400 K/uL   nRBC 0.0 0.0 - 0.2 %   Neutrophils Relative % 72 %  Neutro Abs 7.9 (H) 1.7 - 7.7 K/uL   Lymphocytes Relative 19 %   Lymphs Abs 2.1 0.7 - 4.0 K/uL   Monocytes Relative 6 %   Monocytes Absolute 0.6 0.1 - 1.0 K/uL   Eosinophils Relative 1 %   Eosinophils Absolute 0.1 0.0 - 0.5 K/uL   Basophils Relative 1 %   Basophils Absolute 0.1 0.0 - 0.1 K/uL   Immature Granulocytes 1 %   Abs Immature Granulocytes 0.06 0.00 - 0.07 K/uL    Comment: Performed at [facility], [address], [place], [place] [address]  Comprehensive metabolic panel     Status: Abnormal   Collection Time: 03/16/23 10:35 AM  Result Value Ref Range   Sodium 129 (L) 135 - 145 mmol/L   Potassium 5.3 (H) 3.5 - 5.1 mmol/L   Chloride 93 (L) 98 - 111 mmol/L   CO2 22 22 - 32 mmol/L   Glucose, Bld 461 (H) 70 - 99 mg/dL    Comment: Glucose reference range applies only to samples taken after fasting  for at least 8 hours.   BUN 27 (H) 8 - 23 mg/dL   Creatinine, Ser 8.68 (H) 0.61 - 1.24 mg/dL   Calcium  10.2 8.9 - 10.3 mg/dL   Total Protein 8.1 6.5 - 8.1 g/dL   Albumin 4.0 3.5 - 5.0 g/dL   AST 33 15 - 41 U/L   ALT 53 (H) 0 - 44 U/L   Alkaline Phosphatase 82 38 - 126 U/L   Total Bilirubin 0.9 0.0 - 1.2 mg/dL   GFR, Estimated >39 >39 mL/min    Comment: (NOTE) Calculated using the CKD-EPI Creatinine Equation (2021)    Anion gap 14 5 - 15    Comment: Performed at [facility], [address], [place], [place] [address]  Beta-hydroxybutyric acid     Status: Abnormal   Collection Time: 03/16/23 11:30 AM  Result Value Ref Range   Beta-Hydroxybutyric Acid 3.95 (H) 0.05 - 0.27 mmol/L    Comment: Performed at [facility], [address], [place], [place] [address]  Osmolality     Status: Abnormal   Collection Time: 03/16/23 11:30 AM  Result Value Ref Range   Osmolality 316 (H) 275 - 295 mOsm/kg    Comment: REPEATED TO VERIFY Performed at [facility], [address], [place], [place] [address]   I-Stat Venous Blood Gas, ED     Status: Abnormal   Collection Time: 03/16/23 11:34 AM  Result Value Ref Range   pH, Ven 7.355 7.25 - 7.43   pCO2, Ven 36.1 (L) 44 - 60 mmHg   pO2, Ven 76 (H) 32 - 45 mmHg   Bicarbonate 20.2 20.0 - 28.0 mmol/L   TCO2 21 (L) 22 - 32 mmol/L   O2 Saturation 95 %   Acid-base deficit 5.0 (H) 0.0 - 2.0 mmol/L   Sodium 129 (L) 135 - 145 mmol/L   Potassium 4.3 3.5 - 5.1 mmol/L   Calcium , Ion 1.23 1.15 - 1.40 mmol/L   HCT 46.0 39.0 - 52.0 %   Hemoglobin 15.6 13.0 - 17.0 g/dL   Sample type VENOUS   CBG monitoring, ED     Status: Abnormal   Collection Time: 03/16/23 12:28 PM  Result Value Ref Range   Glucose-Capillary 404 (H) 70 - 99 mg/dL    Comment: Glucose reference range applies only to samples taken after fasting for at least 8 hours.  CBG monitoring, ED  Status: Abnormal   Collection Time: 03/16/23  1:23 PM  Result Value Ref Range    Glucose-Capillary 268 (H) 70 - 99 mg/dL    Comment: Glucose reference range applies only to samples taken after fasting for at least 8 hours.  HIV Antibody (routine testing w rflx)     Status: None   Collection Time: 03/16/23  3:12 PM  Result Value Ref Range   HIV Screen 4th Generation wRfx Non Reactive Non Reactive    Comment: Performed at [facility], [address] [place], [place] [address]  Basic metabolic panel     Status: Abnormal   Collection Time: 03/16/23  3:12 PM  Result Value Ref Range   Sodium 135 135 - 145 mmol/L   Potassium 4.0 3.5 - 5.1 mmol/L   Chloride 102 98 - 111 mmol/L   CO2 22 22 - 32 mmol/L   Glucose, Bld 230 (H) 70 - 99 mg/dL    Comment: Glucose reference range applies only to samples taken after fasting for at least 8 hours.   BUN 21 8 - 23 mg/dL   Creatinine, Ser 9.02 0.61 - 1.24 mg/dL   Calcium  8.6 (L) 8.9 - 10.3 mg/dL   GFR, Estimated >39 >39 mL/min    Comment: (NOTE) Calculated using the CKD-EPI Creatinine Equation (2021)    Anion gap 11 5 - 15    Comment: Performed at [facility], [address], [place], [place] [address]  Urinalysis, Routine w reflex microscopic -Urine, Clean Catch     Status: Abnormal   Collection Time: 03/16/23  4:00 PM  Result Value Ref Range   Color, Urine YELLOW YELLOW   APPearance CLEAR CLEAR   Specific Gravity, Urine 1.021 1.005 - 1.030   pH 5.0 5.0 - 8.0   Glucose, UA >=500 (A) NEGATIVE mg/dL   Hgb urine dipstick NEGATIVE NEGATIVE   Bilirubin Urine NEGATIVE NEGATIVE   Ketones, ur 80 (A) NEGATIVE mg/dL   Protein, ur NEGATIVE NEGATIVE mg/dL   Nitrite NEGATIVE NEGATIVE   Leukocytes,Ua NEGATIVE NEGATIVE   RBC / HPF 0-5 0 - 5 RBC/hpf   WBC, UA 0-5 0 - 5 WBC/hpf   Bacteria, UA NONE SEEN NONE SEEN   Squamous Epithelial / HPF 0-5 0 - 5 /HPF   Mucus PRESENT     Comment: Performed at [facility], [address], [place], [place] [address]   No results found.  Pending Labs Unresulted Labs (From  admission, onward)     Start     Ordered   03/17/23 0500  CBC  Tomorrow morning,   R        03/16/23 1303   03/17/23 0500  Basic metabolic panel  Tomorrow morning,   R        03/16/23 1303   03/16/23 1340  TSH  Add-on,   AD        03/16/23 1339   03/16/23 1130  Beta-hydroxybutyric acid  (Diabetes Ketoacidosis (DKA))  Now then every 8 hours,   STAT (with TIMED, URGENT occurrences)      03/16/23 1129            Vitals/Pain Today's Vitals   03/16/23 1400 03/16/23 1415 03/16/23 1430 03/16/23 1545  BP: 136/78 139/79 138/77 (!) 149/78  Pulse: (!) 103 95 96 97  Resp: 18 16 19 19   Temp:    98.1 F (36.7 C)  TempSrc:    Oral  SpO2: 96% 95% 94% 95%  PainSc:  Isolation Precautions No active isolations  Medications Medications  dextrose  50 % solution 0-50 mL (has no administration in time range)  enoxaparin  (LOVENOX ) injection 40 mg (40 mg Subcutaneous Given 03/16/23 1353)  sodium chloride  flush (NS) 0.9 % injection 3 mL (3 mLs Intravenous Given 03/16/23 1339)  acetaminophen  (TYLENOL ) tablet 650 mg (has no administration in time range)    Or  acetaminophen  (TYLENOL ) suppository 650 mg (has no administration in time range)  ondansetron  (ZOFRAN ) tablet 4 mg (has no administration in time range)    Or  ondansetron  (ZOFRAN ) injection 4 mg (has no administration in time range)  albuterol  (PROVENTIL ) (2.5 MG/3ML) 0.083% nebulizer solution 2.5 mg (has no administration in time range)  insulin  glargine-yfgn (SEMGLEE ) injection 10 Units (10 Units Subcutaneous Given 03/16/23 1352)  insulin  aspart (novoLOG ) injection 0-5 Units (has no administration in time range)  insulin  aspart (novoLOG ) injection 0-9 Units (3 Units Subcutaneous Given 03/16/23 1352)  0.9 %  sodium chloride  infusion ( Intravenous New Bag/Given 03/16/23 1358)  lactated ringers  bolus 1,692 mL (0 mLs Intravenous Stopped 03/16/23 1319)  insulin  starter kit- pen needles (English) 1 kit (1 kit Other Given 03/16/23 1440)   living well with diabetes book MISC ( Does not apply Given 03/16/23 1441)    Mobility walks     Focused Assessments Cardiac Assessment Handoff:  Cardiac Rhythm: Normal sinus rhythm (HR 95) Lab Results  Component Value Date   CKTOTAL 125 05/08/2013   No results found for: DDIMER Does the Patient currently have chest pain? No    R Recommendations: See Admitting Provider Note  Report given to:   Additional Notes:

## 2023-03-16 NOTE — Telephone Encounter (Signed)
 Copied from CRM [id number]. Topic: General - Other >> Mar 16, 2023  8:19 AM Fonda Kinder J wrote: Reason for CRM: Pts daughter wanted to notify the office that the pt has been transported to [facility] following his lab results

## 2023-03-16 NOTE — Telephone Encounter (Signed)
 Called patient and discussed with both the patient and his wife critical results was received.  His labs indicate that he has an anion gap acidosis.  Patient appears to be in DKA.  I have recommended that they seek emergent care in the ER as I anticipate he will need hospitalization for IV fluids, electrolyte correction, insulin .  Both he and his wife voiced good understanding and will head directly to [facility] in [place].  I spoke to the intake nurse there in the ER, Crystal, she is aware of his impending arrival by private vehicle  18.0 mEq/L Anion gap Delta gap: 6.0 mEq/L Delta ratio: 6.0; suggests high anion gap acidosis with concurrent metabolic alkalosis (or compensated respiratory acidosis)  16.3 mEq/L Albumin corrected anion gap; suggests high anion gap acidosis Albumin corrected delta gap: 4.3 mEq/L Albumin corrected delta ratio: 4.3  Results for orders placed or performed in visit on 03/15/23 (from the past 24 hours)  Glucose Hemocue Waived     Status: Abnormal   Collection Time: 03/15/23  8:44 AM  Result Value Ref Range   Glu Hemocue Waived 444 (H) 70 - 99 mg/dL   Narrative   Performed at:  [address] [facility] [address] [place], [place]  [id number] Lab Director: Stefano Sprang [facility], Phone:  [phone]  Hemoglobin, fingerstick     Status: None   Collection Time: 03/15/23  8:44 AM  Result Value Ref Range   Hemoglobin 15.1 12.6 - 17.7 g/dL   Narrative   Performed at:  [facility] [address] [place], [place]  [id number] Lab Director: Stefano Sprang [facility], Phone:  [phone]  Bayer DCA Hb A1c Waived     Status: Abnormal   Collection Time: 03/15/23  9:36 AM  Result Value Ref Range   HB A1C (BAYER DCA - WAIVED) 13.7 (H) 4.8 - 5.6 %   Narrative   Performed at:  [address] [facility] [address], [place], [place]  [id number] Lab Director: Stefano Sprang [facility], Phone:  [phone]  CMP14+EGFR     Status: Abnormal   Collection Time: 03/15/23  9:38 AM   Result Value Ref Range   Glucose 513 (HH) 70 - 99 mg/dL   BUN 24 8 - 27 mg/dL   Creatinine, Ser 8.63 (H) 0.76 - 1.27 mg/dL   eGFR 58 (L) >40 fO/fpw/8.26   BUN/Creatinine Ratio 18 10 - 24   Sodium 127 (L) 134 - 144 mmol/L   Potassium 5.6 (H) 3.5 - 5.2 mmol/L   Chloride 86 (L) 96 - 106 mmol/L   CO2 23 20 - 29 mmol/L   Calcium  10.1 8.6 - 10.2 mg/dL   Total Protein 8.1 6.0 - 8.5 g/dL   Albumin 4.7 3.9 - 4.9 g/dL   Globulin, Total 3.4 1.5 - 4.5 g/dL   Bilirubin Total 0.5 0.0 - 1.2 mg/dL   Alkaline Phosphatase 118 44 - 121 IU/L   AST 28 0 - 40 IU/L   ALT 54 (H) 0 - 44 IU/L   Narrative   Performed at:  [address] [place], [place]  [id number] Lab Director: Frankey Sas MD, Phone:  [phone]  Specimen Comment: Called/faxed to [place] Joy RN on 03/16/2023 at 03:22 Specimen Comment: ET for test Glucose  CBC with Differential/Platelet     Status: Abnormal   Collection Time: 03/15/23  9:38 AM  Result Value Ref Range   WBC 10.3 3.4 - 10.8 x10E3/uL   RBC 5.03 4.14 - 5.80  x10E6/uL   Hemoglobin 15.1 13.0 - 17.7 g/dL   Hematocrit 53.7 62.4 - 51.0 %   MCV 92 79 - 97 fL   MCH 30.0 26.6 - 33.0 pg   MCHC 32.7 31.5 - 35.7 g/dL   RDW 88.2 88.3 - 84.5 %   Platelets 258 150 - 450 x10E3/uL   Neutrophils 69 Not Estab. %   Lymphs 22 Not Estab. %   Monocytes 6 Not Estab. %   Eos 1 Not Estab. %   Basos 1 Not Estab. %   Neutrophils Absolute 7.2 (H) 1.4 - 7.0 x10E3/uL   Lymphocytes Absolute 2.3 0.7 - 3.1 x10E3/uL   Monocytes Absolute 0.6 0.1 - 0.9 x10E3/uL   EOS (ABSOLUTE) 0.1 0.0 - 0.4 x10E3/uL   Basophils Absolute 0.1 0.0 - 0.2 x10E3/uL   Immature Granulocytes 1 Not Estab. %   Immature Grans (Abs) 0.1 0.0 - 0.1 x10E3/uL   Narrative   Performed at:  [address] [place], [place]  [id number] Lab Director: Frankey Sas MD, Phone:  [phone]  Specimen Comment: Called/faxed to Comer Joy RN on 03/16/2023 at 03:22 Specimen  Comment: ET for test Glucose

## 2023-03-16 NOTE — ED Triage Notes (Signed)
 Pt reports increased urination and thirst x 3 weeks.  Seen at [facility] and told blood sugar was high yesterday.  Started on Metformin  yesterday.  No previous history of diabetes.  Reports intermittent nausea and generalized weakness.  20 lb weight loss in the last 3 weeks and blurred vision.

## 2023-03-16 NOTE — ED Notes (Signed)
 Admitting MD at [facility].

## 2023-03-16 NOTE — ED Provider Notes (Signed)
 [place] EMERGENCY DEPARTMENT AT [facility] Provider Note   CSN: [id number] Arrival date & time: 03/16/23  9042     History  No chief complaint on file.   Kendrick Haapala. is a 67 y.o. male.  HPI   Patient is a 67 year old male, he has a history of hypertension, he was diagnosed yesterday with diabetes at his family doctor's office.  He had gone to see his family doctor because of a couple of weeks of progressive polydipsia polyuria and some weight loss.  He was found to have hyperglycemia in the office and was started on metformin  of which she took his first dose last night.  This morning he was called and told about abnormal results and told to come to the emergency department.  He had ketones in his urine, there was concern for DKA.  The patient has not been vomiting he has not had diarrhea but he does feel increasingly weak and lightheaded and tachycardic measuring heart rates at home as high as 120.  He has had nothing to eat or drink this morning  Home Medications Prior to Admission medications   Medication Sig Start Date End Date Taking? Authorizing Provider  Blood Glucose Monitoring Suppl DEVI 1 each by Does not apply route in the morning, at noon, and at bedtime. May substitute to any manufacturer covered by patient's insurance. 03/15/23   Zollie Lowers, MD  cyclobenzaprine  (FLEXERIL ) 10 MG tablet TAKE 1 TABLET BY MOUTH THREE TIMES A DAY AS NEEDED FOR MUSCLE SPASMS 02/22/23   Zollie Lowers, MD  doxepin  (SINEQUAN ) 25 MG capsule TAKE 1 CAPSULE BY MOUTH EVERYDAY AT BEDTIME 05/11/22   Zollie Lowers, MD  Garlic 1000 MG CAPS Take 1 capsule by mouth 2 (two) times daily at 10 AM and 5 PM.    [provider]  Glucose Blood (BLOOD GLUCOSE TEST STRIPS) STRP 1 each by In Vitro route in the morning, at noon, and at bedtime. May substitute to any manufacturer covered by patient's insurance. 03/15/23 04/14/23  Zollie Lowers, MD  hydrochlorothiazide  (HYDRODIURIL ) 25 MG  tablet Take 1 tablet (25 mg total) by mouth daily. 05/11/22   Zollie Lowers, MD  Lancet Device MISC 1 each by Does not apply route in the morning, at noon, and at bedtime. May substitute to any manufacturer covered by patient's insurance. 03/15/23 04/14/23  Zollie Lowers, MD  Lancets Misc. MISC 1 each by Does not apply route in the morning, at noon, and at bedtime. May substitute to any manufacturer covered by patient's insurance. 03/15/23 04/14/23  Zollie Lowers, MD  lisinopril  (ZESTRIL ) 10 MG tablet Take 1 tablet (10 mg total) by mouth daily. 05/11/22   Zollie Lowers, MD  metFORMIN  (GLUCOPHAGE -XR) 500 MG 24 hr tablet Take 1 tablet (500 mg total) by mouth 2 (two) times daily with a meal. 03/15/23   Zollie Lowers, MD  rosuvastatin  (CRESTOR ) 10 MG tablet Take 1 tablet (10 mg total) by mouth daily. 05/12/22   Zollie Lowers, MD  Vitamin D , Ergocalciferol , (DRISDOL ) 1.25 MG (50000 UNIT) CAPS capsule Take 1 capsule (50,000 Units total) by mouth every 7 (seven) days. 05/12/22   Zollie Lowers, MD      Allergies    Pravastatin     Review of Systems   Review of Systems  All other systems reviewed and are negative.   Physical Exam Updated Vital Signs BP (!) 152/81   Pulse 99   Temp 98.3 F (36.8 C)   Resp 18   SpO2 99%  Physical Exam Vitals and nursing note reviewed.  Constitutional:      General: He is not in acute distress.    Appearance: He is well-developed.  HENT:     Head: Normocephalic and atraumatic.     Mouth/Throat:     Mouth: Mucous membranes are dry.     Pharynx: No oropharyngeal exudate.  Eyes:     General: No scleral icterus.       Right eye: No discharge.        Left eye: No discharge.     Conjunctiva/sclera: Conjunctivae normal.     Pupils: Pupils are equal, round, and reactive to light.  Neck:     Thyroid : No thyromegaly.     Vascular: No JVD.  Cardiovascular:     Rate and Rhythm: Regular rhythm. Tachycardia present.     Heart sounds: Normal heart sounds. No  murmur heard.    No friction rub. No gallop.  Pulmonary:     Effort: Pulmonary effort is normal. No respiratory distress.     Breath sounds: Normal breath sounds. No wheezing or rales.  Abdominal:     General: Bowel sounds are normal. There is no distension.     Palpations: Abdomen is soft. There is no mass.     Tenderness: There is no abdominal tenderness.  Musculoskeletal:        General: No tenderness. Normal range of motion.     Cervical back: Normal range of motion and neck supple.     Right lower leg: No edema.     Left lower leg: No edema.  Lymphadenopathy:     Cervical: No cervical adenopathy.  Skin:    General: Skin is warm and dry.     Findings: No erythema or rash.  Neurological:     Mental Status: He is alert.     Coordination: Coordination normal.  Psychiatric:        Behavior: Behavior normal.     ED Results / Procedures / Treatments   Labs (all labs ordered are listed, but only abnormal results are displayed) Labs Reviewed  CBC WITH DIFFERENTIAL/PLATELET - Abnormal; Notable for the following components:      Result Value   WBC 10.8 (*)    Neutro Abs 7.9 (*)    All other components within normal limits  COMPREHENSIVE METABOLIC PANEL - Abnormal; Notable for the following components:   Sodium 129 (*)    Potassium 5.3 (*)    Chloride 93 (*)    Glucose, Bld 461 (*)    BUN 27 (*)    Creatinine, Ser 1.31 (*)    ALT 53 (*)    All other components within normal limits  BETA-HYDROXYBUTYRIC ACID - Abnormal; Notable for the following components:   Beta-Hydroxybutyric Acid 3.95 (*)    All other components within normal limits  I-STAT VENOUS BLOOD GAS, ED - Abnormal; Notable for the following components:   pCO2, Ven 36.1 (*)    pO2, Ven 76 (*)    TCO2 21 (*)    Acid-base deficit 5.0 (*)    Sodium 129 (*)    All other components within normal limits  BETA-HYDROXYBUTYRIC ACID  URINALYSIS, ROUTINE W REFLEX MICROSCOPIC  CBG MONITORING, ED    EKG EKG  Interpretation Date/Time:  Tuesday March 16 2023 12:25:25 EST Ventricular Rate:  93 PR Interval:    QRS Duration:  89 QT Interval:  357 QTC Calculation: 444 R Axis:   58  Text Interpretation: Normal sinus rhythm Poor R  wave progression ECG OTHERWISE WITHIN NORMAL LIMITS No old tracing to compare Confirmed by Cleotilde Rogue ([id number]) on 03/16/2023 12:35:08 PM  Radiology No results found.  Procedures .Critical Care  Performed by: Cleotilde Rogue, MD Authorized by: Cleotilde Rogue, MD   Critical care provider statement:    Critical care time (minutes):  30   Critical care time was exclusive of:  Separately billable procedures and treating other patients and teaching time   Critical care was necessary to treat or prevent imminent or life-threatening deterioration of the following conditions:  Endocrine crisis   Critical care was time spent personally by me on the following activities:  Development of treatment plan with patient or surrogate, discussions with consultants, evaluation of patient's response to treatment, examination of patient, ordering and review of laboratory studies, ordering and review of radiographic studies, ordering and performing treatments and interventions, pulse oximetry, re-evaluation of patient's condition, review of old charts and obtaining history from patient or surrogate   I assumed direction of critical care for this patient from another provider in my specialty: no     Care discussed with: admitting provider   Comments:           Medications Ordered in ED Medications  0.9 %  sodium chloride  infusion ( Intravenous New Bag/Given 03/16/23 1146)  insulin  regular, human (MYXREDLIN ) 100 units/ 100 mL infusion (13 Units/hr Intravenous New Bag/Given 03/16/23 1230)  lactated ringers  infusion (has no administration in time range)  dextrose  5 % in lactated ringers  infusion (has no administration in time range)  dextrose  50 % solution 0-50 mL (has no administration  in time range)  insulin  starter kit- pen needles (English) 1 kit (has no administration in time range)  living well with diabetes book MISC (has no administration in time range)  lactated ringers  bolus 1,692 mL (1,692 mLs Intravenous New Bag/Given 03/16/23 1149)    ED Course/ Medical Decision Making/ A&P                                 Medical Decision Making Amount and/or Complexity of Data Reviewed Labs: ordered. ECG/medicine tests: ordered.  Risk Prescription drug management. Decision regarding hospitalization.    This patient presents to the ED for concern of hyperglycemic crisis, this involves an extensive number of treatment options, and is a complaint that carries with it a high risk of complications and morbidity.  The differential diagnosis includes DKA, new onset diabetes, underlying infection, severe dehydration, renal dysfunction, electrolyte abnormalities   Co morbidities that complicate the patient evaluation  New onset diabetes, hypertension   Additional history obtained:  Additional history obtained from medical record External records from outside source obtained and reviewed including office visits and laboratory workup from the office yesterday   Lab Tests:  I Ordered, and personally interpreted labs.  The pertinent results include: The patient has borderline DKA, hyponatremic, hyperglycemic, ketonuria, beta hydroxybutyrate is 3.95, pH is okay    Cardiac Monitoring: / EKG:  The patient was maintained on a cardiac monitor.  I personally viewed and interpreted the cardiac monitored which showed an underlying rhythm of: Sinus tachycardia   Consultations Obtained:  I requested consultation with the hospitalist Dr. Claudene,  and discussed lab and imaging findings as well as pertinent plan - they recommend: Admission to the hospital   Problem List / ED Course / Critical interventions / Medication management  This patient has what appears to be  borderline  DKA, he is not vomiting but appears to be tachycardic and mild to moderate acidosis.  Insulin  drip started, will be admitted to the hospital, critically ill I ordered medication including IV fluids and insulin  drip for DKA Reevaluation of the patient after these medicines showed that the patient improving I have reviewed the patients home medicines and have made adjustments as needed   Social Determinants of Health:  New onset diabetes   Test / Admission - Considered:  Admit to higher level of care         Final Clinical Impression(s) / ED Diagnoses Final diagnoses:  Diabetic ketoacidosis without coma associated with type 2 diabetes mellitus [facility])    Rx / DC Orders ED Discharge Orders     None         Cleotilde Rogue, MD 03/16/23 1239

## 2023-03-16 NOTE — H&P (Signed)
 History and Physical    Patient: Tyler Pruitt. FMW:[id number] DOB: Jun 11, 1955 DOA: 03/16/2023 DOS: the patient was seen and examined on 03/16/2023 PCP: Zollie Lowers, MD  Patient coming from: Home  Chief Complaint: Elevated blood sugar  HPI: Lyfe Monger. is a 67 y.o. male with medical history significant of hypertension, hyperlipidemia, diabetes mellitus type 2, IBS, general anxiety disorder, GERD presents with complaints of weakness and elevated blood sugars.  Experiencing symptoms for the past three to four weeks. The symptoms include dry mouth, blurry vision, intermittent nausea, weight loss of possible 20 pounds, and frequent urination.  Reports urinating  every one to two hours around the clock. There have been no associated fevers, cough, vomiting, dysuria, or abdominal pain.  Upon consultation with his primary care provider yesterday, he was diagnosed with diabetes and started on metformin . He only took one dose of the mid medication. His blood sugars were significantly elevated, with readings around 400-500, and a Hemoglobin A1c of 13.7 which he was advised to come to the hospital for further evaluation.  On admission into the emergency department patient was noted to be afebrile with pulse elevated at 121, blood pressures elevated up to 152/81,, and all other vital signs maintained.  Labs significant for WBC 10.8, sodium 129, potassium 5.3, CO2 22, BUN 27, creatinine 1.31,  glucose 461, and anion gap 14.  Patient had been bolused IV fluids and started on insulin  drip per protocol.  Review of Systems: As mentioned in the history of present illness. All other systems reviewed and are negative. Past Medical History:  Diagnosis Date   Anxiety    generalized anxiety disorder   Arthritis    BACK   Diabetes (HCC)    Fatty liver    GAD (generalized anxiety disorder)    GERD (gastroesophageal reflux disease)    Hyperlipidemia    Hypertension    IBS (irritable bowel syndrome)     Low testosterone     Palpitations    Palpitations    Vitamin D  deficiency    Past Surgical History:  Procedure Laterality Date   CARPAL TUNNEL RELEASE Right    COLONOSCOPY     None     POLYPECTOMY     Social History:  reports that he quit smoking about 31 years ago. His smoking use included cigarettes. He has never been exposed to tobacco smoke. He has never used smokeless tobacco. He reports current alcohol use of about 2.0 - 3.0 standard drinks of alcohol per week. He reports that he does not use drugs.  Allergies  Allergen Reactions   Pravastatin      Hurting badly    Family History  Problem Relation Age of Onset   Rheum arthritis Mother        Died age 66   Heart disease Father 17       Valve replacement, died age 35 suicide   Colon cancer Neg Hx    Colon polyps Neg Hx    Crohn's disease Neg Hx    Esophageal cancer Neg Hx    Rectal cancer Neg Hx    Stomach cancer Neg Hx    Ulcerative colitis Neg Hx     Prior to Admission medications   Medication Sig Start Date End Date Taking? Authorizing Provider  Blood Glucose Monitoring Suppl DEVI 1 each by Does not apply route in the morning, at noon, and at bedtime. May substitute to any manufacturer covered by patient's insurance. 03/15/23   Zollie Lowers, MD  cyclobenzaprine  (FLEXERIL ) 10 MG tablet TAKE 1 TABLET BY MOUTH THREE TIMES A DAY AS NEEDED FOR MUSCLE SPASMS 02/22/23   Zollie Lowers, MD  doxepin  (SINEQUAN ) 25 MG capsule TAKE 1 CAPSULE BY MOUTH EVERYDAY AT BEDTIME 05/11/22   Zollie Lowers, MD  Garlic 1000 MG CAPS Take 1 capsule by mouth 2 (two) times daily at 10 AM and 5 PM.    [provider]  Glucose Blood (BLOOD GLUCOSE TEST STRIPS) STRP 1 each by In Vitro route in the morning, at noon, and at bedtime. May substitute to any manufacturer covered by patient's insurance. 03/15/23 04/14/23  Zollie Lowers, MD  hydrochlorothiazide  (HYDRODIURIL ) 25 MG tablet Take 1 tablet (25 mg total) by mouth daily. 05/11/22    Zollie Lowers, MD  Lancet Device MISC 1 each by Does not apply route in the morning, at noon, and at bedtime. May substitute to any manufacturer covered by patient's insurance. 03/15/23 04/14/23  Zollie Lowers, MD  Lancets Misc. MISC 1 each by Does not apply route in the morning, at noon, and at bedtime. May substitute to any manufacturer covered by patient's insurance. 03/15/23 04/14/23  Zollie Lowers, MD  lisinopril  (ZESTRIL ) 10 MG tablet Take 1 tablet (10 mg total) by mouth daily. 05/11/22   Zollie Lowers, MD  metFORMIN  (GLUCOPHAGE -XR) 500 MG 24 hr tablet Take 1 tablet (500 mg total) by mouth 2 (two) times daily with a meal. 03/15/23   Zollie Lowers, MD  rosuvastatin  (CRESTOR ) 10 MG tablet Take 1 tablet (10 mg total) by mouth daily. 05/12/22   Zollie Lowers, MD  Vitamin D , Ergocalciferol , (DRISDOL ) 1.25 MG (50000 UNIT) CAPS capsule Take 1 capsule (50,000 Units total) by mouth every 7 (seven) days. 05/12/22   Zollie Lowers, MD    Physical Exam: Vitals:   03/16/23 1019  BP: (!) 132/93  Pulse: (!) 121  Resp: 16  Temp: 98.3 F (36.8 C)  SpO2: 96%    Constitutional: Elderly male currently in NAD, calm, comfortable Eyes: PERRL, lids and conjunctivae normal ENMT: Mucous membranes are moist. Posterior pharynx clear of any exudate or lesions.Normal dentition.  Neck: normal, supple  Respiratory: clear to auscultation bilaterally, no wheezing, no crackles. Normal respiratory effort. No accessory muscle use.  Cardiovascular: Tachycardic.  No extremity edema. 2+ pedal pulses. No carotid bruits.  Abdomen: no tenderness, no masses palpated.   Bowel sounds positive.  Musculoskeletal: no clubbing / cyanosis. No joint deformity upper and lower extremities. Good ROM, no contractures. Normal muscle tone.  Skin: no rashes, lesions, ulcers.  Poor skin turgor. Neurologic: CN 2-12 grossly intact. Sensation intact, DTR normal. Strength 5/5 in all 4.  Psychiatric: Normal judgment and insight. Alert and  oriented x 3. Normal mood.   Data Reviewed:  EKG revealed normal sinus rhythm at 93 bpm with poor R wave progression.  Reviewed labs, imaging, and prior records as documented.  Assessment and Plan:  New onset diabetes mellitus type 2 Patient presents with complaints of increased thirst, urinary frequency, and weight loss over the last couple weeks.  Hemoglobin A1c noted to be elevated up to 13.7 yesterday at doctor's office. Labs noted sodium 129, potassium 5.3, CO2 22, BUN 27, creatinine 1.31,  glucose 461, and anion gap 14.  Venous pH was noted to be within normal limits at 7.355.    Urinalysis from yesterday was positive for glucose and ketones.  Patient had been ordered bolus of 1.692 L of lactated Ringer 's and then placed on insulin  drip.  Patient appears to be more so  in HHS. -Admit to a telemetry bed -Follow-up urinalysis -Check serum osmolarity(316) -Hold metformin  -Bolus additional liter of IV fluids -Discontinue insulin  drip -Carb modified diet -Transition to Semglee  10 units daily -CBGs before every meal with moderate SSI -Dietitian consulted -Diabetic education consulted  Leukocytosis WBC elevated at 10.8.  Patient otherwise noted to be afebrile.  Suspect reactive to above. -Follow-up urinalysis -Recheck CBC tomorrow morning  Acute kidney injury Creatinine elevated up to 1.31 with BUN 27.  Baseline creatinine previously noted to be around 1.  With patient's reports of urinary frequency and increased thirst suspect dehydration.  Patient had been bolused however 1 L of IV fluids in the ED. -Monitor intake and output -Given additional unit of 1 L normal saline -Recheck kidney function  Hyperkalemia Acute.  Potassium elevated at 5.2.  Suspect potassium levels are improved with IV fluids and insulin . -Recheck BMP -Continue current therapies  Essential hypertension Blood pressures were currently maintained. -Hold lisinopril  due to hyperkalemia and AKI -Holding  hydrochlorothiazide  as patient acutely dehydrated -Hydralazine  IV as needed overnight  Hyperlipidemia -Continue Crestor   Obesity BMI 34.27 kg/m  DVT prophylaxis: Lovenox   Advance Care Planning:   Code Status: Full Code   Consults: None  Family Communication: Family updated at bedside  Severity of Illness: The appropriate patient status for this patient is OBSERVATION. Observation status is judged to be reasonable and necessary in order to provide the required intensity of service to ensure the patient's safety. The patient's presenting symptoms, physical exam findings, and initial radiographic and laboratory data in the context of their medical condition is felt to place them at decreased risk for further clinical deterioration. Furthermore, it is anticipated that the patient will be medically stable for discharge from the hospital within 2 midnights of admission.   Author: Maximino DELENA Sharps, MD 03/16/2023 12:30 PM  For on call review [email or web address].

## 2023-03-17 ENCOUNTER — Encounter (HOSPITAL_COMMUNITY): Payer: Self-pay | Admitting: Internal Medicine

## 2023-03-17 DIAGNOSIS — F411 Generalized anxiety disorder: Secondary | ICD-10-CM | POA: Diagnosis not present

## 2023-03-17 DIAGNOSIS — E875 Hyperkalemia: Secondary | ICD-10-CM | POA: Diagnosis not present

## 2023-03-17 DIAGNOSIS — Z6834 Body mass index (BMI) 34.0-34.9, adult: Secondary | ICD-10-CM | POA: Diagnosis not present

## 2023-03-17 DIAGNOSIS — N179 Acute kidney failure, unspecified: Secondary | ICD-10-CM | POA: Diagnosis not present

## 2023-03-17 DIAGNOSIS — R350 Frequency of micturition: Secondary | ICD-10-CM | POA: Diagnosis not present

## 2023-03-17 DIAGNOSIS — E559 Vitamin D deficiency, unspecified: Secondary | ICD-10-CM | POA: Diagnosis not present

## 2023-03-17 DIAGNOSIS — K219 Gastro-esophageal reflux disease without esophagitis: Secondary | ICD-10-CM | POA: Diagnosis not present

## 2023-03-17 DIAGNOSIS — Z79899 Other long term (current) drug therapy: Secondary | ICD-10-CM | POA: Diagnosis not present

## 2023-03-17 DIAGNOSIS — D72829 Elevated white blood cell count, unspecified: Secondary | ICD-10-CM | POA: Diagnosis not present

## 2023-03-17 DIAGNOSIS — Z8249 Family history of ischemic heart disease and other diseases of the circulatory system: Secondary | ICD-10-CM | POA: Diagnosis not present

## 2023-03-17 DIAGNOSIS — E119 Type 2 diabetes mellitus without complications: Secondary | ICD-10-CM

## 2023-03-17 DIAGNOSIS — E785 Hyperlipidemia, unspecified: Secondary | ICD-10-CM | POA: Diagnosis not present

## 2023-03-17 DIAGNOSIS — Z87891 Personal history of nicotine dependence: Secondary | ICD-10-CM | POA: Diagnosis not present

## 2023-03-17 DIAGNOSIS — Z7984 Long term (current) use of oral hypoglycemic drugs: Secondary | ICD-10-CM | POA: Diagnosis not present

## 2023-03-17 DIAGNOSIS — R739 Hyperglycemia, unspecified: Secondary | ICD-10-CM | POA: Diagnosis not present

## 2023-03-17 DIAGNOSIS — K760 Fatty (change of) liver, not elsewhere classified: Secondary | ICD-10-CM | POA: Diagnosis not present

## 2023-03-17 DIAGNOSIS — E860 Dehydration: Secondary | ICD-10-CM | POA: Diagnosis not present

## 2023-03-17 DIAGNOSIS — E669 Obesity, unspecified: Secondary | ICD-10-CM | POA: Diagnosis not present

## 2023-03-17 DIAGNOSIS — E1100 Type 2 diabetes mellitus with hyperosmolarity without nonketotic hyperglycemic-hyperosmolar coma (NKHHC): Secondary | ICD-10-CM | POA: Diagnosis not present

## 2023-03-17 DIAGNOSIS — Z8261 Family history of arthritis: Secondary | ICD-10-CM | POA: Diagnosis not present

## 2023-03-17 DIAGNOSIS — I10 Essential (primary) hypertension: Secondary | ICD-10-CM | POA: Diagnosis not present

## 2023-03-17 DIAGNOSIS — R040 Epistaxis: Secondary | ICD-10-CM | POA: Diagnosis not present

## 2023-03-17 LAB — URINE CULTURE

## 2023-03-17 LAB — GLUCOSE, CAPILLARY
Glucose-Capillary: 302 mg/dL — ABNORMAL HIGH (ref 70–99)
Glucose-Capillary: 241 mg/dL — ABNORMAL HIGH (ref 70–99)
Glucose-Capillary: 362 mg/dL — ABNORMAL HIGH (ref 70–99)
Glucose-Capillary: 227 mg/dL — ABNORMAL HIGH (ref 70–99)

## 2023-03-17 LAB — CBC
HCT: 36.2 % — ABNORMAL LOW (ref 39.0–52.0)
Hemoglobin: 12.5 g/dL — ABNORMAL LOW (ref 13.0–17.0)
MCH: 29.6 pg (ref 26.0–34.0)
MCHC: 34.5 g/dL (ref 30.0–36.0)
MCV: 85.8 fL (ref 80.0–100.0)
Platelets: 167 10*3/uL (ref 150–400)
RBC: 4.22 MIL/uL (ref 4.22–5.81)
RDW: 12 % (ref 11.5–15.5)
WBC: 8.4 10*3/uL (ref 4.0–10.5)
nRBC: 0 % (ref 0.0–0.2)

## 2023-03-17 LAB — BASIC METABOLIC PANEL
Anion gap: 9 (ref 5–15)
BUN: 16 mg/dL (ref 8–23)
CO2: 23 mmol/L (ref 22–32)
Calcium: 8.6 mg/dL — ABNORMAL LOW (ref 8.9–10.3)
Chloride: 99 mmol/L (ref 98–111)
Creatinine, Ser: 0.95 mg/dL (ref 0.61–1.24)
GFR, Estimated: >60 mL/min (ref >60)
Glucose, Bld: 292 mg/dL — ABNORMAL HIGH (ref 70–99)
Potassium: 3.9 mmol/L (ref 3.5–5.1)
Sodium: 131 mmol/L — ABNORMAL LOW (ref 135–145)

## 2023-03-17 LAB — BETA-HYDROXYBUTYRIC ACID: Beta-Hydroxybutyric Acid: 2.33 mmol/L — ABNORMAL HIGH (ref 0.05–0.27)

## 2023-03-17 MED ORDER — ALUM & MAG HYDROXIDE-SIMETH 200-200-20 MG/5ML PO SUSP
30.0000 mL | ORAL | Status: DC | PRN
  Administered 2023-03-17: 30 mL via ORAL
  Filled 2023-03-17: qty 30

## 2023-03-17 MED ORDER — LINAGLIPTIN 5 MG PO TABS
5.0000 mg | ORAL_TABLET | Freq: Every day | ORAL | Status: DC
  Administered 2023-03-18: 5 mg via ORAL
  Filled 2023-03-17: qty 1

## 2023-03-17 MED ORDER — INSULIN GLARGINE-YFGN 100 UNIT/ML ~~LOC~~ SOLN
20.0000 [IU] | Freq: Every day | SUBCUTANEOUS | Status: DC
  Filled 2023-03-17: qty 0.2

## 2023-03-17 MED ORDER — INSULIN GLARGINE-YFGN 100 UNIT/ML ~~LOC~~ SOLN
10.0000 [IU] | Freq: Once | SUBCUTANEOUS | Status: AC
  Administered 2023-03-17: 10 [IU] via SUBCUTANEOUS
  Filled 2023-03-17: qty 0.1

## 2023-03-17 MED ORDER — INSULIN ASPART 100 UNIT/ML IJ SOLN
3.0000 [IU] | Freq: Three times a day (TID) | INTRAMUSCULAR | Status: DC
  Administered 2023-03-17 – 2023-03-18 (×4): 3 [IU] via SUBCUTANEOUS

## 2023-03-17 NOTE — Inpatient Diabetes Management (Signed)
 Inpatient Diabetes Program Recommendations  AACE/ADA: New Consensus Statement on Inpatient Glycemic Control (2015)  Target Ranges:  Prepandial:   less than 140 mg/dL      Peak postprandial:   less than 180 mg/dL (1-2 hours)      Critically ill patients:  140 - 180 mg/dL   Lab Results  Component Value Date   GLUCAP 241 (H) 03/17/2023   HGBA1C 13.7 (H) 03/15/2023    Review of Glycemic Control  Diabetes history: type 2 Outpatient Diabetes medications: new onset diabetes Current orders for Inpatient glycemic control: Semglee  20 units daily, Novolog  0-15 units correction scale TID, Novolog  3 units TID  Inpatient Diabetes Program Recommendations:   Spoke with patient about his new onset diabetes. States that he had been having symptoms of frequent thirst and urination for about 3 weeks. He has lost 20 pounds over the last few weeks. Was seen by family physician and diagnosed diabetes and was given Metformin . Blood sugars were elevated, so physician recommended that patient go to the ED for follow up.  Discussed diabetes, pathophysiology, normal blood sugars levels, insulin  use, hypoglycemia and how to treat. Reviewed the insulin  pen procedure with demo pen. Patient did very well in return demonstration. Wife was able to perform demonstration with the pen without issues.   Insulin  pen starter kit was given to patient in the ED, but noted that it included syringes instead of insulin  pen needles. Staff RN to try to change insulin  pen starter kit to the correct one through pharmacy. Will add DM exit notes to discharge information. Dietician will be seeing patient later. Asked pharmacy to do a benefit check on insulin  that is covered by his insurance.   Will continue to monitor blood sugars while in the hospital.   Marjorie Lunger RN BSN CDE Diabetes Coordinator Pager: [phone]  8am-5pm

## 2023-03-17 NOTE — Progress Notes (Signed)
 Pt complained of indigestion. Pt states it is not constant but comes and goes. Message Dr. Hazeline Junker to request medication for indigestion.

## 2023-03-17 NOTE — Plan of Care (Signed)

## 2023-03-17 NOTE — Progress Notes (Signed)
 [facility] PROGRESS NOTE  Tyler Pruitt. (DOB: 09-09-1955) FMW:[id number] PCP: Zollie Lowers, MD  Brief Narrative: Chayim Bialas. is a 68 y.o. male with a history of recently diagnosed T2DM, HTN, HLD who presented to the ED on 03/16/2023 with weakness, hyperglycemia, polyuria, polydipsia, unintentional weight loss. He was referred to ED by PCP after glucose consistently in 400's and HbA1c came back at 13.7%. He was severely hyperglycemic without acidosis, admitted for insulin  therapy and titration and education.  Subjective: Feels better, still dazed by the amount of information being given to him Spouse and granddaughter at bedside  Objective: BP 127/66 (BP Location: Right Arm)   Pulse 92   Temp 98.8 F (37.1 C) (Oral)   Resp 16   Ht 5' 2 (1.575 m)   Wt 85 kg   SpO2 99%   BMI 34.27 kg/m   Gen: No distress Pulm: Clear, nonlabored  CV: RRR, no MRG GI: Soft, NT, ND, +BS  Neuro: Alert and oriented. No new focal deficits. Ext: Warm, no deformities. Skin: No acanthosis or other rashes, lesions or ulcers on visualized skin   Assessment & Plan: Newly diagnosed IDT2DM: HbA1c 13.7%.  - Will require insulin  at discharge based on A1c and current hyperglycemia. Fasting CBG >300 this AM, will double basal insulin  to 20u (give additional 10u this AM) and titrate based on tomorrow's fasting level.  - Add mealtime insulin , start lower than expected final dose. Continue moderate SSI. - Consideration given to basal insulin  only at discharge vs. 70/30 injections. Pt is insured, will also consider additional oral agents e.g. linagliptin .  - Given BMI of 34, there is an element of resistance, continue metformin , if tolerated, increase dosage. - Patient prefers to follow up with endocrinology for ongoing care, will refer at discharge.  - Appreciate efforts of RD, DM coordinator. Continuing education and RN instructing on insulin  administration.  - Pt gets steroids at times for back  pain, urged him to follow CBGs closely if/when he is put on these.  - Will start ASA, continue statin, restart ACE/ARB as discussed below.   AKI: SCr 1.31 from putative baseline of 1. Likely prerenal, now resolved.  - No proteinuria on dipstick - Holding thiazide and ACEi - Will likely continue ACEi for renal protection at discharge.  HTN:  - Normotensive.  Hyperkalemia: Improved with IVF and insulin  and holding ACEi.   Leukocytosis: Reactive, resolved without antimicrobial Tx. Afebrile. No nidus of infection noted.   Obesity: Body mass index is 34.27 kg/m.  - May be candidate for GLP1 agent, defer to PCP   Bernardino KATHEE Come, MD [facility] [email or web address] 03/17/2023, 5:30 PM

## 2023-03-17 NOTE — Progress Notes (Signed)
  RD consulted for nutrition education regarding diabetes.   Lab Results  Component Value Date   HGBA1C 13.7 (H) 03/15/2023    RD provided Carbohydrate Counting for People with Diabetes , The Plate Method  and Label Reading from the Academy of Nutrition and Dietetics. Discussed different food groups and their effects on blood sugar, emphasizing carbohydrate-containing foods; Provided list of carbohydrates and recommended serving sizes of common foods.  Discussed importance of controlled and consistent carbohydrate intake throughout the day. Provided examples of ways to balance meals/snacks and encouraged intake of high-fiber, whole grain complex carbohydrates.   Expect  compliance. Patient expresses feeling overwhelmed and confused with amount of information. Prioritized goals before leaving hospital: Encouraged consumption of three well spaced meals and one to two snacks daily, using the plate method as a guide; identify CHO sources and CHO amount per meal and portion control using meal slip to compare; demonstrates knowledge of basic label reading. Encouraged patient to read through materials two times and write down questions, to review the following day.  Patient is noted to have outpatient follow up with diabetic educator after discharge.  Body mass index is 34.27 kg/m. Pt meets criteria for Class I Obesity based on current BMI.  Current diet order is Carb modified, patient is consuming approximately 100% x1 meal recorded at this time. Labs and medications reviewed. No further nutrition interventions warranted at this time. RD contact information provided. If additional nutrition issues arise, please re-consult RD.  Tyler Pruitt, RDLD Clinical Dietitian If unable to reach, please contact RD Inpatient secure chat group between 8 am-4 pm daily

## 2023-03-17 NOTE — Progress Notes (Signed)
 Per diabetes coordinator, Smith Mince, RN. Instructions for pt to demonstrate administering insulin injection. Pt administered 1245 dose of semglee successfully by following all proper steps taught to him by the diabetes coordinator.

## 2023-03-18 ENCOUNTER — Other Ambulatory Visit (HOSPITAL_COMMUNITY): Payer: Self-pay

## 2023-03-18 ENCOUNTER — Telehealth (HOSPITAL_COMMUNITY): Payer: Self-pay | Admitting: Pharmacy Technician

## 2023-03-18 ENCOUNTER — Other Ambulatory Visit: Payer: Self-pay | Admitting: *Deleted

## 2023-03-18 DIAGNOSIS — E119 Type 2 diabetes mellitus without complications: Secondary | ICD-10-CM | POA: Diagnosis not present

## 2023-03-18 LAB — GLUCOSE, CAPILLARY
Glucose-Capillary: 242 mg/dL — ABNORMAL HIGH (ref 70–99)
Glucose-Capillary: 264 mg/dL — ABNORMAL HIGH (ref 70–99)
Glucose-Capillary: 229 mg/dL — ABNORMAL HIGH (ref 70–99)
Glucose-Capillary: 293 mg/dL — ABNORMAL HIGH (ref 70–99)
Glucose-Capillary: 299 mg/dL — ABNORMAL HIGH (ref 70–99)

## 2023-03-18 MED ORDER — BLOOD GLUCOSE TEST VI STRP: 1.0000 | ORAL_STRIP | Freq: Three times a day (TID) | 11 refills | Status: DC

## 2023-03-18 MED ORDER — LISINOPRIL 10 MG PO TABS
10.0000 mg | ORAL_TABLET | Freq: Every day | ORAL | Status: DC
  Administered 2023-03-18 – 2023-03-19 (×2): 10 mg via ORAL
  Filled 2023-03-18 (×2): qty 1

## 2023-03-18 MED ORDER — ENSURE ENLIVE PO LIQD
237.0000 mL | Freq: Two times a day (BID) | ORAL | Status: DC
  Administered 2023-03-19 – 2023-03-20 (×2): 237 mL via ORAL

## 2023-03-18 MED ORDER — INSULIN GLARGINE-YFGN 100 UNIT/ML ~~LOC~~ SOLN
30.0000 [IU] | Freq: Every day | SUBCUTANEOUS | Status: DC
  Administered 2023-03-18: 30 [IU] via SUBCUTANEOUS
  Filled 2023-03-18 (×2): qty 0.3

## 2023-03-18 MED ORDER — ASPIRIN 81 MG PO TBEC
81.0000 mg | DELAYED_RELEASE_TABLET | Freq: Every day | ORAL | Status: DC
  Administered 2023-03-18 – 2023-03-20 (×3): 81 mg via ORAL
  Filled 2023-03-18 (×3): qty 1

## 2023-03-18 NOTE — Inpatient Diabetes Management (Signed)
 Inpatient Diabetes Program Recommendations  AACE/ADA: New Consensus Statement on Inpatient Glycemic Control (2015)  Target Ranges:  Prepandial:   less than 140 mg/dL      Peak postprandial:   less than 180 mg/dL (1-2 hours)      Critically ill patients:  140 - 180 mg/dL   Lab Results  Component Value Date   GLUCAP 264 (H) 03/18/2023   HGBA1C 13.7 (H) 03/15/2023    Review of Glycemic Control  Latest Reference Range & Units 03/17/23 08:08 03/17/23 11:29 03/17/23 17:02 03/17/23 21:42 03/18/23 08:30 03/18/23 11:50  Glucose-Capillary 70 - 99 mg/dL 697 (H) 758 (H) 637 (H) 227 (H) 242 (H) 264 (H)   Diabetes history: New Diagnosis  Current orders for Inpatient glycemic control:  Semglee  30 units Daily Novolog  0-15 units tid + hs Novolog  3 units tid meal coverage  Inpatient Diabetes Program Recommendations:    Based on glucose trends and the need for meal coverage. Pt may benefit from 70/30 (20 units bid) insulin  to simplify the regimen. All oral medications are expensive.  Spoke with pt and wife at bedside. They are okay with the operation of an insulin  pen. Wife obtained a prescription for a glucose meter kit from their PCP. Pt and wife wanted to be shown how to use the glucometer. Noticed the glucometer did not have testing strips. Told wife to contact provider for order of testing strips for meter. Discussed glucose and A1c goals. Reviewed when to check a glucose at home. Generally reviewed lifestyle modification via diet. Pt and wife report feeling a little over whelmed. I will see again tomorrow.   Thanks,  Clotilda Bull RN, MSN, BC-ADM Inpatient Diabetes Coordinator Team Pager [phone] (8a-5p)

## 2023-03-18 NOTE — Progress Notes (Signed)
 [facility] PROGRESS NOTE  Tyler Pruitt. (DOB: August 22, 1955) FMW:[id number] PCP: Zollie Lowers, MD  Brief Narrative: Tyler Pruitt. is a 68 y.o. male with a history of recently diagnosed T2DM, HTN, HLD who presented to the ED on 03/16/2023 with weakness, hyperglycemia, polyuria, polydipsia, unintentional weight loss. He was referred to ED by PCP after glucose consistently in 400's and HbA1c came back at 13.7%. He was severely hyperglycemic without acidosis, admitted for insulin  therapy and titration and education.  Subjective: Remains overwhelmed with information around diabetes. Gave himself an injection of insulin  yesterday without issues. Drinks a lot of sodas at home.   Objective: BP (!) 151/78 (BP Location: Left Arm)   Pulse 91   Temp 98.3 F (36.8 C) (Oral)   Resp 18   Ht 5' 2 (1.575 m)   Wt 85 kg   SpO2 98%   BMI 34.27 kg/m   Gen: No distress Pulm: Nonlabored  CV: RRR, no edema Neuro: Alert and oriented. No new focal deficits. Ext: Warm, no deformities Skin: No new rashes, lesions or ulcers on visualized skin   Assessment & Plan: Newly diagnosed IDT2DM: HbA1c 13.7%. Anticipate a significant amount of improvement if he can adhere to lifestyle modifications.  - Tradjenta  is expensive, will run benefit checks for SGLT2i's.  - Fasting CBG still quite elevated, 242 this AM. Received max dose sliding scale yesterday evening as well.  - Increase basal to 30u (may have to initiate basal monotherapy at discharge to provide simplicity in regimen). Continue meal coverage and SSI, titrate daily dosing over next 24 hours. Anticipate he'll be well enough controlled and educated to discharge 1/3. Lantus  pen copay is $35.  - Given BMI of 34, there is an element of resistance, continue metformin , if tolerated at follow up, increase dosage. Also consider GLP1, defer to PCP - Patient prefers to follow up with endocrinology for ongoing care, will refer at discharge.  - Appreciate  efforts of RD, DM coordinator. Continuing education and RN instructing on insulin  administration.  - Pt gets steroids at times for back pain, urged him to follow CBGs closely if/when he is put on these.  - Will start ASA after discussing with patient/family - Continue statin - Restart ACE inhibitor  AKI: SCr 1.31 from putative baseline of 1. Likely prerenal, now resolved.  - No proteinuria on dipstick - Holding thiazide - Will likely continue ACEi for renal protection at discharge.  HTN:  - BP climbing, restart home lisinopril .  Hyperkalemia: Improved with IVF and insulin  and holding ACEi.   Leukocytosis: Reactive, resolved without antimicrobial Tx. Afebrile. No nidus of infection noted.   Obesity: Body mass index is 34.27 kg/m.    Bernardino KATHEE Come, MD [facility] [email or web address] 03/18/2023, 12:53 PM

## 2023-03-18 NOTE — Telephone Encounter (Addendum)
 Patient Product/process Development Scientist completed.    The patient is insured through [employer]. Patient has Medicare and is not eligible for a copay card, but may be able to apply for patient assistance, if available.    Ran test claim for Lantus  Pen and the current 30 day co-pay is $35.00.  Ran test claim for Novolog  Pen and the current 30 day co-pay is $35.00.  Ran test claim for Tradjenta  5 mg and the current 30 day co-pay is $129.09  Ran test claim for Farxiga 10 mg and the current 30 day co-pay is $151.05  Ran test claim for Jardiance 10 mg and the current 30 day co-pay is $158.54  Ran test claim for Dexcom G7 Sensor and the current 30 day co-pay is $0.00  Ran test claim for [employer] 3 Sensor and the current 30 day co-pay is $0.00  This test claim was processed through [employer]- copay amounts may vary at other pharmacies due to [employer], or as the patient moves through the different stages of their insurance plan.     Reyes Sharps, CPHT Pharmacy Technician III Certified Patient Advocate [facility] Pharmacy Patient Advocate Team Direct Number: [phone]  Fax: [phone]

## 2023-03-18 NOTE — Care Management (Signed)
 Transition of Care [facility]) - Inpatient Brief Assessment   Patient Details  Name: Tyler Pruitt. MRN: [id number] Date of Birth: 01/02/1956  [facility]) CM/SW Contact:    Corean JAYSON Canary, RN Phone Number: 03/18/2023, 1:40 PM   Clinical Narrative: 68 yo with New onset DM II will need insulin , eligibility done for several types from pharmacy. Patient has PCP, need to make appt in 7-10 days and follow up with endocrinology. Referral  pending from provider.Has been getting education on injections and insulin .  No needs identified a tthis time.The paitent will be discussed in daily progressive rounds. If a need is identified, please place a TOC consult   Transition of Care Asessment: Insurance and Status: Insurance coverage has been reviewed Patient has primary care physician: Yes Home environment has been reviewed: Lives with Spouse Prior level of function:: Independent Prior/Current Home Services: No current home services Social Drivers of Health Review: SDOH reviewed no interventions necessary Readmission risk has been reviewed: Yes Transition of care needs: no transition of care needs at this time

## 2023-03-18 NOTE — Telephone Encounter (Signed)
 Sent as requested.

## 2023-03-18 NOTE — Progress Notes (Signed)
 Nutrition Brief Note  RD followed up with pt, wife and granddaughter regarding further nutrition questions and ongoing education. Addressed specific concerns/questions.  Pt reports drinking lots of soda, as well as gatorade and sometimes kool-aid at home. He does not like diet sodas however is agreeable to transitioning to more water intake as well as sugar free drink options.   Encouraged pairing of protein and carbohydrates as well as adding more fiber containing foods to aid in glucose control.   Encouraged follow up with outpatient nutrition education services following discharge. Pt endorses having an appointment already scheduled.  Please reach out with additional nutrition related concerns/questions as needed.    Allie Jazier Mcglamery, RDN, LDN Clinical Nutrition

## 2023-03-18 NOTE — Telephone Encounter (Signed)
 duplicate

## 2023-03-19 ENCOUNTER — Other Ambulatory Visit (HOSPITAL_BASED_OUTPATIENT_CLINIC_OR_DEPARTMENT_OTHER): Payer: Self-pay

## 2023-03-19 ENCOUNTER — Other Ambulatory Visit (HOSPITAL_COMMUNITY): Payer: Self-pay

## 2023-03-19 ENCOUNTER — Other Ambulatory Visit: Payer: Self-pay

## 2023-03-19 DIAGNOSIS — E119 Type 2 diabetes mellitus without complications: Secondary | ICD-10-CM | POA: Diagnosis not present

## 2023-03-19 LAB — GLUCOSE, CAPILLARY
Glucose-Capillary: 259 mg/dL — ABNORMAL HIGH (ref 70–99)
Glucose-Capillary: 229 mg/dL — ABNORMAL HIGH (ref 70–99)
Glucose-Capillary: 253 mg/dL — ABNORMAL HIGH (ref 70–99)
Glucose-Capillary: 255 mg/dL — ABNORMAL HIGH (ref 70–99)
Glucose-Capillary: 283 mg/dL — ABNORMAL HIGH (ref 70–99)

## 2023-03-19 MED ORDER — INSULIN ASPART PROT & ASPART (70-30 MIX) 100 UNIT/ML ~~LOC~~ SUSP
40.0000 [IU] | Freq: Two times a day (BID) | SUBCUTANEOUS | Status: DC
  Administered 2023-03-19 (×2): 40 [IU] via SUBCUTANEOUS
  Filled 2023-03-19: qty 10

## 2023-03-19 MED ORDER — BLOOD GLUCOSE TEST VI STRP
100.0000 | ORAL_STRIP | Freq: Three times a day (TID) | 0 refills | Status: DC
  Filled 2023-03-19 – 2023-03-24 (×3): qty 100, 30d supply, fill #0

## 2023-03-19 MED ORDER — ONETOUCH DELICA LANCING DEV MISC
1.0000 | Freq: Three times a day (TID) | 0 refills | Status: AC
  Filled 2023-03-19 – 2023-03-24 (×2): qty 1, 30d supply, fill #0

## 2023-03-19 MED ORDER — METFORMIN HCL 500 MG PO TABS
500.0000 mg | ORAL_TABLET | Freq: Two times a day (BID) | ORAL | Status: DC
  Administered 2023-03-19 – 2023-03-20 (×3): 500 mg via ORAL
  Filled 2023-03-19 (×3): qty 1

## 2023-03-19 MED ORDER — LANCETS MISC. MISC: 1.0000 | Freq: Three times a day (TID) | 0 refills | Status: DC

## 2023-03-19 MED ORDER — LISINOPRIL 20 MG PO TABS
20.0000 mg | ORAL_TABLET | Freq: Every day | ORAL | Status: DC
  Administered 2023-03-20: 20 mg via ORAL
  Filled 2023-03-19: qty 1

## 2023-03-19 MED ORDER — BLOOD GLUCOSE MONITORING SUPPL DEVI: 1.0000 | Freq: Three times a day (TID) | 0 refills | Status: DC

## 2023-03-19 MED ORDER — ONETOUCH DELICA PLUS LANCET33G MISC
1.0000 | Freq: Three times a day (TID) | 0 refills | Status: DC
Start: 2023-03-19 — End: 2023-04-18
  Filled 2023-03-19 (×2): qty 100, 30d supply, fill #0

## 2023-03-19 MED ORDER — LANCET DEVICE MISC: 1.0000 | Freq: Three times a day (TID) | 0 refills | Status: DC

## 2023-03-19 MED ORDER — ONETOUCH VERIO FLEX SYSTEM W/DEVICE KIT
1.0000 | PACK | Freq: Three times a day (TID) | 0 refills | Status: DC
  Filled 2023-03-19: qty 1, 1d supply, fill #0

## 2023-03-19 MED ORDER — BLOOD GLUCOSE TEST VI STRP
1.0000 | ORAL_STRIP | Freq: Three times a day (TID) | 0 refills | Status: DC
Start: 2023-03-19 — End: 2023-03-19

## 2023-03-19 NOTE — Plan of Care (Signed)
  Problem: Fluid Volume: Goal: Ability to maintain a balanced intake and output will improve Outcome: Progressing   Problem: Metabolic: Goal: Ability to maintain appropriate glucose levels will improve Outcome: Progressing   Problem: Nutritional: Goal: Maintenance of adequate nutrition will improve Outcome: Progressing   Problem: Education: Goal: Knowledge of General Education information will improve Description: Including pain rating scale, medication(s)/side effects and non-pharmacologic comfort measures Outcome: Progressing   Problem: Clinical Measurements: Goal: Will remain free from infection Outcome: Progressing   Problem: Clinical Measurements: Goal: Cardiovascular complication will be avoided Outcome: Progressing

## 2023-03-19 NOTE — Plan of Care (Signed)

## 2023-03-19 NOTE — Inpatient Diabetes Management (Addendum)
 Inpatient Diabetes Program Recommendations  AACE/ADA: New Consensus Statement on Inpatient Glycemic Control (2015)  Target Ranges:  Prepandial:   less than 140 mg/dL      Peak postprandial:   less than 180 mg/dL (1-2 hours)      Critically ill patients:  140 - 180 mg/dL   Lab Results  Component Value Date   GLUCAP 253 (H) 03/19/2023   HGBA1C 13.7 (H) 03/15/2023    Review of Glycemic Control  Latest Reference Range & Units 03/18/23 19:37 03/18/23 23:11 03/19/23 06:04 03/19/23 07:50 03/19/23 11:50  Glucose-Capillary 70 - 99 mg/dL 706 (H) 700 (H) 740 (H) 229 (H) 253 (H)  (H): Data is abnormally high Diabetes history: New Diagnosis   Current orders for Inpatient glycemic control:  Novolog  70/30-40 units BID, Metformin  500 mg BID   Inpatient Diabetes Program Recommendations:    Consider:  -Increasing Novolog  70/30 45 units BID  Spoke with patient, wife and granddaughter at length regarding new onset DM.  Reinforced concepts discussed by previous coordinators.  Reviewed patient's current A1c of 13.7%. Explained what a A1c is and what it measures. Also reviewed goal A1c with patient, importance of good glucose control @ home, and blood sugar goals. Reviewed patho of DM, role of insulin , role of pancreas, survival skills, interventions, current glucose trends, current insulin  doses, target goals, when to call MD, vascular changes and commorbidities.  Patient needs a meter. Struggled to obtain correct test strips even when ordered from the pharmacy due to change of insurance coverage as of 2025. Meter provided and demonstrated. Patient able to check blood sugars and verbalized when to check. Additionally, placed Freestyle libre 3 to patient's left upper arm. Reviewed benefits of sensor, application, how to apply and interpret information and when to discuss with MD.  List of endocrinology provided and encouraged to make appointment. Patient plans to follow up with PCP.  Admits to sugary  beverage intake. Again, encouraged CHO mindfulness, incorporating protein and how to read nutritional labels.  Outpatient education referral and [facility] placed. No further questions at this time.  [phone]  Thanks, Tinnie Minus, MSN, RNC-OB Diabetes Coordinator [phone] (8a-5p)

## 2023-03-19 NOTE — Plan of Care (Signed)

## 2023-03-19 NOTE — Telephone Encounter (Signed)
 Resent to [facility]

## 2023-03-19 NOTE — Progress Notes (Signed)
 [facility] PROGRESS NOTE  Tyler Pruitt. (DOB: 02-14-1956) FMW:[id number] PCP: Zollie Lowers, MD  Brief Narrative: Tyler Siek. is a 68 y.o. male with a history of recently diagnosed T2DM, HTN, HLD who presented to the ED on 03/16/2023 with weakness, hyperglycemia, polyuria, polydipsia, unintentional weight loss. He was referred to ED by PCP after glucose consistently in 400's and HbA1c came back at 13.7%. He was severely hyperglycemic without acidosis, admitted for insulin  therapy and titration and education.  Subjective: Pt remains anxious about diabetes management. No family at bedside this morning. Has been attempting to get glucometer, though having issues with this. Told to have his spouse bring everything they have in today to review with DM coordinator. Eating well, no physical complaints.   Objective: BP 139/85   Pulse 91   Temp 98.3 F (36.8 C) (Oral)   Resp 18   Ht 5' 2 (1.575 m)   Wt 85 kg   SpO2 97%   BMI 34.27 kg/m   Gen: No distress Pulm: Nonlabored  CV: No edema Neuro: Alert and oriented. No deficits. Skin: No new rashes, lesions or ulcers on visualized skin   Assessment & Plan: Newly diagnosed IDT2DM: HbA1c 13.7%. Anticipate a significant amount of improvement if he can adhere to lifestyle modifications.  - Transition to 70/30 insulin  BID to mimic putative outpatient regimen. Will give 40u BID (total of 57u over past 24 hours and lowest CBG was 229).  - Restart metformin , again to mimic what we anticipate discharging him on. If well tolerated at follow up, increase dosage. - Linagliptin , all SGLT2i's and GLP1 agents are prohibitively expensive (benefit checks run 1/2). Defer prescription/PA to PCP's office at follow up.  - Glucometer teaching today.   - Patient prefers to follow up with endocrinology for ongoing care, will refer at discharge.  - Appreciate efforts of RD, DM coordinator. Continuing education and RN instructing on insulin   administration.  - Pt gets steroids at times for back pain, urged him to follow CBGs closely if/when he is put on these.  - Started aspirin  81mg    - Continue rosuvastatin . ASCVD 10-year risk (based on Aug 2024 lipid panel) is 30-35% for him.  - Restarted ACE inhibitor, BP improved.   AKI: SCr 1.31 from putative baseline of 1. Likely prerenal, now resolved.  - No proteinuria on dipstick.  - Will likely continue ACEi for renal protection at discharge.  HTN:  - BP improved on home lisinopril , will increase dose 10mg  > 20mg  and continue to hold thiazide.  Hyperkalemia: Improved with IVF and insulin  and holding ACEi.   Leukocytosis: Reactive, resolved without antimicrobial Tx. Afebrile. No nidus of infection noted.   Obesity: Body mass index is 34.27 kg/m.    Tyler KATHEE Come, MD [facility] [email or web address] 03/19/2023, 10:47 AM

## 2023-03-20 ENCOUNTER — Other Ambulatory Visit (HOSPITAL_COMMUNITY): Payer: Self-pay

## 2023-03-20 DIAGNOSIS — E119 Type 2 diabetes mellitus without complications: Secondary | ICD-10-CM | POA: Diagnosis not present

## 2023-03-20 LAB — CBC
HCT: 37.1 % — ABNORMAL LOW (ref 39.0–52.0)
Hemoglobin: 12.7 g/dL — ABNORMAL LOW (ref 13.0–17.0)
MCH: 30.2 pg (ref 26.0–34.0)
MCHC: 34.2 g/dL (ref 30.0–36.0)
MCV: 88.1 fL (ref 80.0–100.0)
Platelets: 183 10*3/uL (ref 150–400)
RBC: 4.21 MIL/uL — ABNORMAL LOW (ref 4.22–5.81)
RDW: 12.4 % (ref 11.5–15.5)
WBC: 7.5 10*3/uL (ref 4.0–10.5)
nRBC: 0 % (ref 0.0–0.2)

## 2023-03-20 LAB — BASIC METABOLIC PANEL
Anion gap: 6 (ref 5–15)
BUN: 16 mg/dL (ref 8–23)
CO2: 25 mmol/L (ref 22–32)
Calcium: 9.1 mg/dL (ref 8.9–10.3)
Chloride: 106 mmol/L (ref 98–111)
Creatinine, Ser: 0.88 mg/dL (ref 0.61–1.24)
GFR, Estimated: >60 mL/min (ref >60)
Glucose, Bld: 231 mg/dL — ABNORMAL HIGH (ref 70–99)
Potassium: 4 mmol/L (ref 3.5–5.1)
Sodium: 137 mmol/L (ref 135–145)

## 2023-03-20 LAB — GLUCOSE, CAPILLARY
Glucose-Capillary: 221 mg/dL — ABNORMAL HIGH (ref 70–99)
Glucose-Capillary: 183 mg/dL — ABNORMAL HIGH (ref 70–99)

## 2023-03-20 MED ORDER — INSULIN ASPART PROT & ASPART (70-30 MIX) 100 UNIT/ML PEN: 50.0000 [IU] | PEN_INJECTOR | Freq: Two times a day (BID) | SUBCUTANEOUS | 0 refills | Status: DC

## 2023-03-20 MED ORDER — LISINOPRIL 20 MG PO TABS
20.0000 mg | ORAL_TABLET | Freq: Every day | ORAL | 0 refills | Status: DC
  Filled 2023-03-20: qty 30, 30d supply, fill #0

## 2023-03-20 MED ORDER — FREESTYLE LIBRE 3 SENSOR MISC: 0 refills | Status: DC

## 2023-03-20 MED ORDER — INSULIN PEN NEEDLE 32G X 4 MM MISC
1.0000 | Freq: Two times a day (BID) | 0 refills | Status: DC
  Filled 2023-03-20: qty 100, 50d supply, fill #0

## 2023-03-20 MED ORDER — LISINOPRIL 20 MG PO TABS: 20.0000 mg | ORAL_TABLET | Freq: Every day | ORAL | 0 refills | Status: DC

## 2023-03-20 MED ORDER — FREESTYLE LIBRE 3 SENSOR MISC
0 refills | Status: DC
  Filled 2023-03-20: qty 2, 28d supply, fill #0

## 2023-03-20 MED ORDER — BLOOD GLUCOSE TEST VI STRP: 1.0000 | ORAL_STRIP | Freq: Three times a day (TID) | 0 refills | Status: DC

## 2023-03-20 MED ORDER — LANCETS MISC: 1.0000 | Freq: Three times a day (TID) | 0 refills | Status: DC

## 2023-03-20 MED ORDER — PEN NEEDLES 31G X 5 MM MISC: 1.0000 | Freq: Three times a day (TID) | 0 refills | Status: DC

## 2023-03-20 MED ORDER — INSULIN ASPART PROT & ASPART (70-30 MIX) 100 UNIT/ML ~~LOC~~ SUSP
50.0000 [IU] | Freq: Two times a day (BID) | SUBCUTANEOUS | Status: DC
  Administered 2023-03-20: 50 [IU] via SUBCUTANEOUS

## 2023-03-20 MED ORDER — INSULIN ASPART PROT & ASPART (70-30 MIX) 100 UNIT/ML PEN
50.0000 [IU] | PEN_INJECTOR | Freq: Two times a day (BID) | SUBCUTANEOUS | 0 refills | Status: DC
  Filled 2023-03-20: qty 30, 30d supply, fill #0

## 2023-03-20 NOTE — Progress Notes (Signed)
 Norleen LELON Rattler Jr.to be D/C'd per MD order. Discussed with the patient and all questions fully answered. ? VSS, Skin clean, dry and intact without evidence of skin break down, no evidence of skin tears noted. ? IV catheter discontinued intact. Site without signs and symptoms of complications. Dressing and pressure applied. ? An After Visit Summary was printed and given to the patient. Patient informed where to pickup prescriptions. ? D/c education completed with patient/family including follow up instructions, medication list, d/c activities limitations if indicated, with other d/c instructions as indicated by MD - patient able to verbalize understanding, all questions fully answered.  ? Patient instructed to return to ED, call 911, or call MD for any changes in condition.  D/C home via private auto.

## 2023-03-20 NOTE — Discharge Summary (Signed)
 Physician Discharge Summary   Patient: Tyler Pruitt. MRN: [id number] DOB: 1955-11-10  Admit date:     03/16/2023  Discharge date: 03/20/23  Discharge Physician: Bernardino KATHEE Come   PCP: Zollie Lowers, MD   Recommendations at discharge:  Follow up with PCP 1/13 or earlier if needed for ongoing IDT2DM management. Discharged on novolog  70/30 flexpen 50u BID, metformin , and would consider GLP agent and/or SGLT2i. Suggest monitoring BP and BMP, increased lisinopril  in place of HCTZ which was discontinued. Has nutrition referral.   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 is a 68 y.o. male with a history of recently diagnosed T2DM, HTN, HLD who presented to the ED on 03/16/2023 with weakness, hyperglycemia, polyuria, polydipsia, unintentional weight loss. He was referred to ED by PCP after glucose consistently in 400's and HbA1c came back at 13.7%. He was severely hyperglycemic without acidosis, admitted for insulin  therapy and titration and education. Insulins were titrated aggressively with persistent hyperglycemia, ultimately was given 70/30 units BID and blood glucose still all above 200. Will discharge on 50u BID with PCP follow up. Extensively counseled.   Assessment and Plan: Newly diagnosed IDT2DM: HbA1c 13.7%. Anticipate a significant amount of improvement if he can adhere to lifestyle modifications.  - On 70/30 insulin  40u BID, lowest blood sugar was 221 which was AM fasting CBG. Discharged on 50u BID with plan for close PCP follow up. Applied and prescribed CGM. - Restarted metformin . If well tolerated at follow up, increase dosage. - Linagliptin , all  SGLT2i's and GLP1 agents are prohibitively expensive (benefit checks run 1/2). Defer prescription/PA to PCP's office at follow up.  - Appreciate efforts of RD, DM coordinator. Continuing education and RN instructing on insulin  administration.  - Pt gets steroids at times for back pain, urged him to follow CBGs closely if/when he is put on these.  - Started aspirin  81mg , had an episode of epistaxis, so holding this at discharge, but could be restarted if stable after discharge.    - Continue rosuvastatin . ASCVD 10-year risk (based on Aug 2024 lipid panel) is 30-35% for him.  - Restarted ACE inhibitor.   AKI: SCr 1.31 from putative baseline of 1. Likely prerenal, now resolved.  - No proteinuria on dipstick.  - Continue ACEi for renal protection at discharge.   HTN:  - BP improved and at goal on increased dose of lisinopril  20mg , will continue to hold thiazide.   Hyperkalemia: Improved with IVF and insulin  and holding ACEi.    Leukocytosis: Reactive, resolved without antimicrobial Tx. Afebrile. No nidus of infection noted.    Obesity: Body mass index is 34.27 kg/m.    Consultants: Diabetes coordinator, RD Procedures performed: None  Disposition: Home Diet recommendation:  Cardiac and Carb modified diet DISCHARGE MEDICATION: Allergies as of 03/20/2023       Reactions   Pravastatin     Hurting badly        Medication List     STOP taking these medications    cyclobenzaprine  10 MG tablet Commonly known as: FLEXERIL    hydrochlorothiazide  25 MG tablet Commonly known as: HYDRODIURIL        TAKE these medications    Blood Glucose Monitoring  Suppl Devi 1 each by Does not apply route in the morning, at noon, and at bedtime. May substitute to any manufacturer covered by patient's insurance. What changed: Another medication with the same name was added. Make sure you understand how and when to take each.   OneTouch Verio Flex System w/Device Kit 1 each by Does not apply route  in the morning, at noon, and at bedtime. What changed: You were already taking a medication with the same name, and this prescription was added. Make sure you understand how and when to take each.   BLOOD GLUCOSE TEST STRIPS Strp 1 each by In Vitro route in the morning, at noon, and at bedtime. May substitute to any manufacturer covered by patient's insurance. What changed: Another medication with the same name was added. Make sure you understand how and when to take each.   BLOOD GLUCOSE TEST STRIPS Strp Use 1 test strip daily in the morning, at noon, and at bedtime. What changed: You were already taking a medication with the same name, and this prescription was added. Make sure you understand how and when to take each.   BLOOD GLUCOSE TEST STRIPS Strp 1 each by Does not apply route 3 (three) times daily. Use as directed to check blood sugar. May dispense any manufacturer covered by patient's insurance and fits patient's device. What changed: You were already taking a medication with the same name, and this prescription was added. Make sure you understand how and when to take each.   doxepin  25 MG capsule Commonly known as: SINEQUAN  TAKE 1 CAPSULE BY MOUTH EVERYDAY AT BEDTIME   FreeStyle Libre 3 Sensor Misc Place 1 sensor on the skin every 14 days. Use to check glucose continuously   insulin  aspart protamine - aspart (70-30) 100 UNIT/ML FlexPen Commonly known as: NOVOLOG  70/30 MIX Inject 50 Units into the skin 2 (two) times daily with a meal.   Lancet Device Misc 1 each by Does not apply route in the morning, at noon, and at bedtime. May substitute to any manufacturer covered by patient's insurance. What changed: Another medication with the same name was added. Make sure you understand how and when to take each.   ONE TOUCH DELICA LANCING DEV Misc Use in the morning, at noon, and at bedtime. What changed: You were already taking a medication with the same name, and this prescription  was added. Make sure you understand how and when to take each.   Lancets Misc. Misc 1 each by Does not apply route in the morning, at noon, and at bedtime. May substitute to any manufacturer covered by patient's insurance.   lisinopril  20 MG tablet Commonly known as: ZESTRIL  Take 1 tablet (20 mg total) by mouth daily. What changed:  medication strength how much to take   metFORMIN  500 MG 24 hr tablet Commonly known as: GLUCOPHAGE -XR Take 1 tablet (500 mg total) by mouth 2 (two) times daily with a meal.   OneTouch Delica Plus Lancet33G Misc Use in the morning, at noon, and at bedtime.   Lancets Misc 1 each by Does not apply route 3 (three) times daily. Use as directed to check blood sugar. May dispense any manufacturer covered by patient's insurance and fits patient's device.   Pen Needles 31G X 5 MM Misc 1 each by Does not apply route 3 (three) times daily. May dispense any manufacturer covered by patient's insurance.   rosuvastatin  10 MG tablet Commonly known as: Crestor  Take 1 tablet (10 mg total) by  mouth daily.   Vitamin D  (Ergocalciferol ) 1.25 MG (50000 UNIT) Caps capsule Commonly known as: DRISDOL  Take 1 capsule (50,000 Units total) by mouth every 7 (seven) days. What changed: additional instructions        Follow-up Information     Zollie Lowers, MD Follow up.   Specialty: Family Medicine Why: Make appt 7-10 days Contact information: [address] [place] [address] [phone]                Discharge Exam: Tyler Pruitt   03/16/23 1904  Weight: 85 kg  No distress obese male Clear, nonlabored RRR no MRG no edema Alert, oriented, no focal deficits.   Condition at discharge: stable  The results of significant diagnostics from this hospitalization (including imaging, microbiology, ancillary and laboratory) are listed below for reference.   Imaging Studies: No results found.  Microbiology: Results for orders placed or performed in visit  on 03/15/23  Microscopic Examination     Status: Abnormal   Collection Time: 03/15/23  8:04 AM   Urine  Result Value Ref Range Status   WBC, UA None seen 0 - 5 /hpf Final   RBC, Urine None seen 0 - 2 /hpf Final   Epithelial Cells (non renal) None seen 0 - 10 /hpf Final   Renal Epithel, UA None seen None seen /hpf Final   Bacteria, UA Few (A) None seen/Few Final   Yeast, UA None seen None seen Final  Urine Culture     Status: None   Collection Time: 03/15/23  8:14 AM   Specimen: Urine   UR  Result Value Ref Range Status   Urine Culture, Routine Final report  Final   Organism ID, Bacteria Comment  Final    Comment: Mixed urogenital flora Less than 10,000 colonies/mL     Labs: CBC: Recent Labs  Lab 03/15/23 0938 03/16/23 1035 03/16/23 1134 03/17/23 0814 03/20/23 0504  WBC 10.3 10.8*  --  8.4 7.5  NEUTROABS 7.2* 7.9*  --   --   --   HGB 15.1 15.7 15.6 12.5* 12.7*  HCT 46.2 44.8 46.0 36.2* 37.1*  MCV 92 86.5  --  85.8 88.1  PLT 258 248  --  167 183   Basic Metabolic Panel: Recent Labs  Lab 03/15/23 0938 03/16/23 1035 03/16/23 1134 03/16/23 1512 03/17/23 0814 03/20/23 0504  NA 127* 129* 129* 135 131* 137  K 5.6* 5.3* 4.3 4.0 3.9 4.0  CL 86* 93*  --  102 99 106  CO2 23 22  --  22 23 25   GLUCOSE 513* 461*  --  230* 292* 231*  BUN 24 27*  --  21 16 16   CREATININE 1.36* 1.31*  --  0.97 0.95 0.88  CALCIUM  10.1 10.2  --  8.6* 8.6* 9.1   Liver Function Tests: Recent Labs  Lab 03/15/23 0938 03/16/23 1035  AST 28 33  ALT 54* 53*  ALKPHOS 118 82  BILITOT 0.5 0.9  PROT 8.1 8.1  ALBUMIN 4.7 4.0   CBG: Recent Labs  Lab 03/19/23 0750 03/19/23 1150 03/19/23 1721 03/19/23 1950 03/20/23 0754  GLUCAP 229* 253* 255* 283* 221*    Discharge time spent: greater than 30 minutes.  Signed: Bernardino KATHEE Come, MD [facility] 03/20/2023

## 2023-03-22 ENCOUNTER — Telehealth: Payer: Self-pay | Admitting: *Deleted

## 2023-03-22 ENCOUNTER — Telehealth: Payer: Self-pay

## 2023-03-22 ENCOUNTER — Ambulatory Visit: Payer: Self-pay | Admitting: Family Medicine

## 2023-03-22 DIAGNOSIS — E1110 Type 2 diabetes mellitus with ketoacidosis without coma: Secondary | ICD-10-CM

## 2023-03-22 DIAGNOSIS — E119 Type 2 diabetes mellitus without complications: Secondary | ICD-10-CM

## 2023-03-22 NOTE — Telephone Encounter (Signed)
 Copied from CRM [id number]. Topic: Clinical - Pink Word Triage >> Mar 22, 2023  9:24 AM Carmell SAUNDERS wrote: Reason for Triage: Patient was recently diagnosed with diabetes and was discharged from the hospital Saturday. Dr Zollie advised that if his numbers were low or too high to let him know. Since Sat, his sugar has been as low as 69, only at night. Sugar is fluctuating throughout the day and he is concerned. Please follow up with patient at [phone]   Chief Complaint: Low blood sugar at night  Symptoms: Blood sugar low at night  Frequency: At night Pertinent Negatives: Patient denies symptoms with low blood sugar  Disposition: [] ED /[] Urgent Care (no appt availability in office) / [x] Appointment(In office/virtual)/ []  [place] [facility]/ [] Home Care/ [] Refused Recommended Disposition /[] [facility]/ []  Follow-up with PCP Additional Notes: Patient reports that since Saturday he has been experiencing low blood sugar at night. He states that his phone alerts him that his blood sugar is 69 while asleep. He states he has been getting up and having a snack to bring his blood sugar back up, which has been effective. He denies any dizziness, confusion, or other symptoms during this time. Appointment made for patient and home care advice given. Patient given instructions of when to seek Emergency intervention. Patient understood and is agreeable with this plan.     Reason for Disposition  [1] Morning (before breakfast) blood glucose < 80 mg/dL (4.4 mmol/L) AND [7] more than once in past week  Answer Assessment - Initial Assessment Questions 1. SYMPTOMS: What symptoms are you concerned about?     Low blood sugar at night  2. ONSET:  When did the symptoms start?     2 days ago 3. BLOOD GLUCOSE: What is your blood glucose level?      Blood sugar of 69 at night, 171 at night 4. USUAL RANGE: What is your blood glucose level usually? (e.g., usual fasting morning value, usual evening  value)     80-150 5. TYPE 1 or 2:  Do you know what type of diabetes you have?  (e.g., Type 1, Type 2, Gestational; doesn't know)      Type 2 6. INSULIN : Do you take insulin ? What type of insulin (s) do you use? What is the mode of delivery? (syringe, pen; injection or pump) When did you last give yourself an insulin  dose? (i.e., time or hours/minutes ago) How much did you give? (i.e., how many units)     Yes, 50 units twice a day with meals 7. DIABETES PILLS: Do you take any pills for your diabetes? If Yes, ask: What is the name of the medicine(s) that you take for high blood sugar?     Metformin  8. OTHER SYMPTOMS: Do you have any symptoms? (e.g., fever, frequent urination, difficulty breathing, vomiting)     No 9. LOW BLOOD GLUCOSE TREATMENT: What have you done so far to treat the low blood glucose level?     Drink orange juice  Protocols used: Diabetes - Low Blood Sugar-A-AH

## 2023-03-22 NOTE — Telephone Encounter (Signed)
 If the glucose is low and causes symptoms like nausea, sweats, confusion, eat something sweet right away. If it goes high, keep it on your log and we will address in the office. Try to schedule for tomorrow unless already scheduled for later this week.

## 2023-03-22 NOTE — Transitions of Care (Post Inpatient/ED Visit) (Signed)
 03/22/2023  Name: Tyler Pruitt. MRN: [id number] DOB: 10/10/1955  Today's TOC FU Call Status: Today's TOC FU Call Status:: Successful TOC FU Call Completed TOC FU Call Complete Date: 03/22/23 Patient's Name and Date of Birth confirmed.  Transition Care Management Follow-up Telephone Call Date of Discharge: 03/20/23 Discharge Facility: [facility]) Type of Discharge: Inpatient Admission Primary Inpatient Discharge Diagnosis:: Diabetic Ketoacidosis without coma associated with type 2 diabetes How have you been since you were released from the hospital?:  (pt states he is feeling much better, appetite good, bowel/ bladder function WNL, has meds and taking as prescribed) Any questions or concerns?: Yes Patient Questions/Concerns:: pt called PCP office to report 2-3 CBG readings of 69, appt. has been made for 03/24/23, pt is keeping close check on CBG w/ Freestyle Libre Patient Questions/Concerns Addressed: Other: (reviewed s/s hypoglycemia and prevention, actions to take)  Items Reviewed: Did you receive and understand the discharge instructions provided?: Yes Medications obtained,verified, and reconciled?: Yes (Medications Reviewed) Any new allergies since your discharge?: No Dietary orders reviewed?: Yes Type of Diet Ordered:: carbohydrate modified,  heart healthy Do you have support at home?: Yes People in Home: spouse Name of Support/Comfort Primary Source: Rykar Lebleu   Goals Addressed             This Visit's Progress    Transition of Care/ pt will have no readmissions within 30 days       Current Barriers:  Knowledge Deficits related to plan of care for management of DMII  Patient reports he monitors CBG with Freestyle Libre and also checks with glucometer as needed, pt reports he has had several readings of 69 that drops at bedtime, pt states he eats a snack and CBG elevates quickly, pt reports he talked with primary care provider office and has appointment for  03/24/23 and will discuss further, will also see dietician on 04/07/23.  RNCM Clinical Goal(s):  Patient will work with the Care Management team over the next 30 days to address Transition of Care Barriers: Diet/Nutrition/Food Resources verbalize understanding of plan for management of DMII as evidenced by patient report, review of EMR and  through collaboration with RN Care manager, provider, and care team.   Interventions: Evaluation of current treatment plan related to  self management and patient's adherence to plan as established by provider   Diabetes Interventions:  (Status:  New goal. and Goal on track:  Yes.) Short Term Goal Assessed patient's understanding of A1c goal: <7% Provided education to patient about basic DM disease process Reviewed medications with patient and discussed importance of medication adherence Counseled on importance of regular laboratory monitoring as prescribed Discussed plans with patient for ongoing care management follow up and provided patient with direct contact information for care management team Review of patient status, including review of consultants reports, relevant laboratory and other test results, and medications completed Assessed social determinant of health barriers Reviewed signs/ symptoms of hypoglycemia and treatment Reviewed fasting blood sugar goals of 80-130 and less than 180 1 1/2-2 hours after meals Reviewed carbohydrate modified diet/ plate method with patient Lab Results  Component Value Date   HGBA1C 13.7 (H) 03/15/2023    Patient Goals/Self-Care Activities: Participate in Transition of Care Program/Attend [facility] scheduled calls Notify RN Care Manager of TOC call rescheduling needs Take all medications as prescribed Attend all scheduled provider appointments Call pharmacy for medication refills 3-7 days in advance of running out of medications Perform all self care activities independently  Call  provider office for new concerns  or questions  check blood sugar at prescribed times: per [facility] check feet daily for cuts, sores or redness enter blood sugar readings and medication or insulin  into daily log take the blood sugar log to all doctor visits trim toenails straight across fill half of plate with vegetables manage portion size prepare main meal at home 3 to 5 days each week wash and dry feet carefully every day wear comfortable, cotton socks wear comfortable, well-fitting shoes Follow up with Nutrition Diabetes Management on 04/07/23  @ 445 pm Follow plate method, be mindful of carbohydrate intake at each meal Follow RULE OF 15 for low blood sugar management:  How to treat low blood sugars (Blood sugar less than 70 mg/dl  Please follow the RULE OF 15 for the treatment of hypoglycemia treatment (When your blood sugars are less than 70 mg/ dl) STEP  1:  Take 15 grams of carbohydrates when your blood sugar is low, which includes One tube of glucose gel STEP 2:  Recheck blood sugar in 15 minutes STEP 3:  3-4 glucose tabs or  3-4 oz of juice or regular soda or If your blood sugar is still low at the 15 minute recheck ---then, go back to STEP 1 and treat again with another 15 grams of carbohydrates  Follow Up Plan:  Telephone follow up appointment with care management team member scheduled for:  03/30/23 @ 930 am           Medications Reviewed Today: Medications Reviewed Today     Reviewed by Aura Mliss LABOR, RN (Registered Nurse) on 03/22/23 at 1309  Med List Status: <None>   Medication Order Taking? Sig Documenting Provider Last Dose Status Informant  Blood Glucose Monitoring Suppl (ONETOUCH VERIO FLEX SYSTEM) w/Device KIT [id number] Yes 1 each by Does not apply route in the morning, at noon, and at bedtime. Zollie Lowers, MD Taking Active   Blood Glucose Monitoring Suppl DEVI [id number] Yes 1 each by Does not apply route in the morning, at noon, and at bedtime. May substitute to any manufacturer  covered by patient's insurance. Zollie Lowers, MD Taking Active Self, Pharmacy Records  Continuous Glucose Sensor (FREESTYLE LIBRE 3 [place]) [place] [id number] Yes Place 1 sensor on the skin every 14 days. Use to check glucose continuously Bryn Bernardino NOVAK, MD Taking Active   doxepin  (SINEQUAN ) 25 MG capsule [id number] Yes TAKE 1 CAPSULE BY MOUTH EVERYDAY AT BEDTIME Zollie Lowers, MD Taking Active Self, Pharmacy Records  Glucose Blood (BLOOD GLUCOSE TEST STRIPS) STRP [id number] Yes 1 each by In Vitro route in the morning, at noon, and at bedtime. May substitute to any manufacturer covered by patient's insurance. Zollie Lowers, MD Taking Active   Glucose Blood (BLOOD GLUCOSE TEST STRIPS) STRP [id number] Yes Use 1 test strip daily in the morning, at noon, and at bedtime. Zollie Lowers, MD Taking Active   Glucose Blood (BLOOD GLUCOSE TEST STRIPS) STRP [id number] Yes 1 each by Does not apply route 3 (three) times daily. Use as directed to check blood sugar. May dispense any manufacturer covered by patient's insurance and fits patient's device. Bryn Bernardino NOVAK, MD Taking Active   insulin  aspart protamine - aspart (NOVOLOG  70/30 MIX) (70-30) 100 UNIT/ML FlexPen [id number] Yes Inject 50 Units into the skin 2 (two) times daily with a meal. Bryn Bernardino NOVAK, MD Taking Active   Insulin  Pen Needle 32G X 4 MM MISC [id number] Yes Use to check blood sugars 2 (two) times daily. Bryn Bernardino  B, MD Taking Active   Lancet Device MISC [id number] Yes 1 each by Does not apply route in the morning, at noon, and at bedtime. May substitute to any manufacturer covered by patient's insurance. Zollie Lowers, MD Taking Active Self, Pharmacy Records  Lancet Devices (ONE [facility] DELICA LANCING DEV) MISC [id number] Yes Use in the morning, at noon, and at bedtime. Zollie Lowers, MD Taking Active   Lancets [facility] CAT[place] PLUS [place]) MISC [id number] Yes Use in the morning, at noon, and at bedtime. Zollie Lowers, MD Taking Active   Lancets MISC  [id number] Yes 1 each by Does not apply route 3 (three) times daily. Use as directed to check blood sugar. May dispense any manufacturer covered by patient's insurance and fits patient's device. Bryn Bernardino NOVAK, MD Taking Active   Lancets Misc. MISC [id number] Yes 1 each by Does not apply route in the morning, at noon, and at bedtime. May substitute to any manufacturer covered by patient's insurance. Zollie Lowers, MD Taking Active Self, Pharmacy Records  lisinopril  (ZESTRIL ) 20 MG tablet [id number] Yes Take 1 tablet (20 mg total) by mouth daily. Bryn Bernardino NOVAK, MD Taking Active   metFORMIN  (GLUCOPHAGE -XR) 500 MG 24 hr tablet [id number] Yes Take 1 tablet (500 mg total) by mouth 2 (two) times daily with a meal. Zollie Lowers, MD Taking Active Self, Pharmacy Records  rosuvastatin  (CRESTOR ) 10 MG tablet [id number] Yes Take 1 tablet (10 mg total) by mouth daily. Zollie Lowers, MD Taking Active Self, Pharmacy Records  Vitamin D , Ergocalciferol , (DRISDOL ) 1.25 MG (50000 UNIT) CAPS capsule [id number] Yes Take 1 capsule (50,000 Units total) by mouth every 7 (seven) days.  Patient taking differently: Take 50,000 Units by mouth every 7 (seven) days. Saturday, last dose was on 12-28-20204.   Zollie Lowers, MD Taking Active Self, Pharmacy Records            Home Care and Equipment/Supplies: Were Home Health Services Ordered?: No (pt states  I didn't want home health) Any new equipment or medical supplies ordered?: No  Functional Questionnaire: Do you need assistance with bathing/showering or dressing?: No Do you need assistance with meal preparation?: No Do you need assistance with eating?: No Do you have difficulty maintaining continence: No Do you need assistance with getting out of bed/getting out of a chair/moving?: No Do you have difficulty managing or taking your medications?: No  Follow up appointments reviewed: PCP Follow-up appointment confirmed?: Yes Date of PCP follow-up appointment?:  03/24/23 Follow-up Provider: Dr. Lowers [employer] Follow-up appointment confirmed?: Yes Date of Specialist follow-up appointment?: 04/07/23 Follow-Up Specialty Provider:: Nutritional Diabetes Management  @ 445 pm Do you need transportation to your follow-up appointment?: No Do you understand care options if your condition(s) worsen?: Yes-patient verbalized understanding  SDOH Interventions Today    Flowsheet Row Most Recent Value  SDOH Interventions   Food Insecurity Interventions Intervention Not Indicated  Housing Interventions Intervention Not Indicated  Transportation Interventions Intervention Not Indicated  Utilities Interventions Intervention Not Indicated       Mliss Creed [facility], BSN RN Care Manager/ Transition of Care [place]/ [facility] Population Health [phone]

## 2023-03-24 ENCOUNTER — Encounter: Payer: Self-pay | Admitting: Family Medicine

## 2023-03-24 ENCOUNTER — Ambulatory Visit (INDEPENDENT_AMBULATORY_CARE_PROVIDER_SITE_OTHER): Payer: Medicare HMO | Admitting: Family Medicine

## 2023-03-24 VITALS — BP 110/67 | HR 94 | Temp 97.4°F | Ht 62.0 in | Wt 188.0 lb

## 2023-03-24 DIAGNOSIS — F5101 Primary insomnia: Secondary | ICD-10-CM | POA: Diagnosis not present

## 2023-03-24 DIAGNOSIS — Z794 Long term (current) use of insulin: Secondary | ICD-10-CM | POA: Diagnosis not present

## 2023-03-24 DIAGNOSIS — I10 Essential (primary) hypertension: Secondary | ICD-10-CM

## 2023-03-24 DIAGNOSIS — Z7985 Long-term (current) use of injectable non-insulin antidiabetic drugs: Secondary | ICD-10-CM | POA: Diagnosis not present

## 2023-03-24 DIAGNOSIS — E119 Type 2 diabetes mellitus without complications: Secondary | ICD-10-CM

## 2023-03-24 MED ORDER — INSULIN ASPART PROT & ASPART (70-30 MIX) 100 UNIT/ML PEN: 45.0000 [IU] | PEN_INJECTOR | Freq: Two times a day (BID) | SUBCUTANEOUS | 0 refills | Status: DC

## 2023-03-24 MED ORDER — DOXEPIN HCL 25 MG PO CAPS: ORAL_CAPSULE | ORAL | 3 refills | Status: DC

## 2023-03-24 MED ORDER — TIRZEPATIDE 2.5 MG/0.5ML ~~LOC~~ SOAJ: 2.5000 mg | SUBCUTANEOUS | 0 refills | Status: DC

## 2023-03-24 MED ORDER — LISINOPRIL 20 MG PO TABS: 20.0000 mg | ORAL_TABLET | Freq: Every day | ORAL | 1 refills | Status: DC

## 2023-03-24 MED ORDER — ROSUVASTATIN CALCIUM 10 MG PO TABS: 10.0000 mg | ORAL_TABLET | Freq: Every day | ORAL | 3 refills | Status: AC

## 2023-03-24 NOTE — Progress Notes (Signed)
 Subjective:  Patient ID: Tyler Pruitt., male    DOB: Jul 15, 1955  Age: 68 y.o. MRN: [id number]  CC: Blood Sugar Problem (Low blood sugar/Just got out of hospital Saturday. /Pt has readings with him from sensor. )   HPI Tyler Pruitt. presents for recheck of diabetes. Placed on Novolog  70/30 50 units BID while hospitalized last week for DKA. Now concerned about low glucose.The [place] alarms at 69. Has occurred 3 times. No sx, but he went ahead with eating carbs to bring it up.      03/24/2023    2:41 PM 03/15/2023    8:02 AM 03/15/2023    7:47 AM  Depression screen PHQ 2/9  Decreased Interest 0 0 0  Down, Depressed, Hopeless 0 0 0  PHQ - 2 Score 0 0 0  Altered sleeping 0    Tired, decreased energy 0    Change in appetite 0    Feeling bad or failure about yourself  0    Trouble concentrating 0    Moving slowly or fidgety/restless 0    Suicidal thoughts 0    PHQ-9 Score 0      History Kalon has a past medical history of Anxiety, Arthritis, Diabetes (HCC), Fatty liver, GAD (generalized anxiety disorder), GERD (gastroesophageal reflux disease), Hyperlipidemia, Hypertension, IBS (irritable bowel syndrome), Low testosterone , Palpitations, Palpitations, and Vitamin D  deficiency.   He has a past surgical history that includes None; Colonoscopy; Polypectomy; and Carpal tunnel release (Right).   His family history includes Heart disease (age of onset: 29) in his father; Rheum arthritis in his mother.He reports that he quit smoking about 31 years ago. His smoking use included cigarettes. He has never been exposed to tobacco smoke. He has never used smokeless tobacco. He reports current alcohol use of about 2.0 - 3.0 standard drinks of alcohol per week. He reports that he does not use drugs.    ROS Review of Systems  Constitutional: Negative.   HENT: Negative.    Eyes:  Negative for visual disturbance.  Respiratory:  Negative for cough and shortness of breath.   Cardiovascular:   Negative for chest pain and leg swelling.  Gastrointestinal:  Negative for abdominal pain, diarrhea, nausea and vomiting.  Genitourinary:  Negative for difficulty urinating.  Musculoskeletal:  Negative for arthralgias and myalgias.  Skin:  Negative for rash.  Neurological:  Negative for headaches.  Psychiatric/Behavioral:  Negative for sleep disturbance.     Objective:  BP 110/67   Pulse 94   Temp (!) 97.4 F (36.3 C) (Temporal)   Ht 5' 2 (1.575 m)   Wt 188 lb (85.3 kg)   SpO2 99%   BMI 34.39 kg/m   BP Readings from Last 3 Encounters:  03/24/23 110/67  03/20/23 (!) 161/85  11/09/22 (!) 157/71    Wt Readings from Last 3 Encounters:  03/24/23 188 lb (85.3 kg)  03/16/23 187 lb 6.3 oz (85 kg)  03/15/23 186 lb 9.6 oz (84.6 kg)     Physical Exam Vitals reviewed.  Constitutional:      Appearance: He is well-developed.  HENT:     Head: Normocephalic and atraumatic.     Right Ear: External ear normal.     Left Ear: External ear normal.     Mouth/Throat:     Pharynx: No oropharyngeal exudate or posterior oropharyngeal erythema.  Eyes:     Pupils: Pupils are equal, round, and reactive to light.  Cardiovascular:     Rate and Rhythm:  Normal rate and regular rhythm.     Heart sounds: No murmur heard. Pulmonary:     Effort: No respiratory distress.     Breath sounds: Normal breath sounds.  Musculoskeletal:     Cervical back: Normal range of motion and neck supple.  Neurological:     Mental Status: He is alert and oriented to person, place, and time.       Assessment & Plan:   Cliffton was seen today for blood sugar problem.  Diagnoses and all orders for this visit:  Primary insomnia -     doxepin  (SINEQUAN ) 25 MG capsule; TAKE 1 CAPSULE BY MOUTH EVERYDAY AT BEDTIME  Essential hypertension -     lisinopril  (ZESTRIL ) 20 MG tablet; Take 1 tablet (20 mg total) by mouth daily.  Diabetes mellitus, new onset (HCC)  Type 2 diabetes mellitus without complication, with  long-term current use of insulin  (HCC)  Diabetes mellitus treated with injections of non-insulin  medication (HCC)  Other orders -     rosuvastatin  (CRESTOR ) 10 MG tablet; Take 1 tablet (10 mg total) by mouth daily. -     tirzepatide  (MOUNJARO ) 2.5 MG/0.5ML Pen; Inject 2.5 mg into the skin once a week. -     insulin  aspart protamine - aspart (NOVOLOG  70/30 MIX) (70-30) 100 UNIT/ML FlexPen; Inject 45 Units into the skin 2 (two) times daily with a meal.       I have discontinued Tyler MICAEL Rattler Jr.'s OneTouch Delica Plus Lancet33G. I have also changed his insulin  aspart protamine - aspart. Additionally, I am having him start on tirzepatide . Lastly, I am having him maintain his Vitamin D  (Ergocalciferol ), metFORMIN , Blood Glucose Monitoring Suppl, Lancet Device, Lancets Misc., BLOOD GLUCOSE TEST STRIPS, OneTouch Verio Flex System, BLOOD GLUCOSE TEST STRIPS, ONE TOUCH DELICA LANCING DEV, BLOOD GLUCOSE TEST STRIPS, Lancets, FreeStyle Libre 3 Sensor, Insulin  Pen Needle, doxepin , rosuvastatin , and lisinopril .  Allergies as of 03/24/2023       Reactions   Pravastatin     Hurting badly        Medication List        Accurate as of March 24, 2023  9:31 PM. If you have any questions, ask your nurse or doctor.          BD Pen Needle Nano U/F 32G X 4 MM Misc Generic drug: Insulin  Pen Needle Use to check blood sugars 2 (two) times daily.   Blood Glucose Monitoring Suppl Devi 1 each by Does not apply route in the morning, at noon, and at bedtime. May substitute to any manufacturer covered by patient's insurance.   OneTouch Verio Flex System w/Device Kit 1 each by Does not apply route in the morning, at noon, and at bedtime.   BLOOD GLUCOSE TEST STRIPS Strp 1 each by In Vitro route in the morning, at noon, and at bedtime. May substitute to any manufacturer covered by patient's insurance.   BLOOD GLUCOSE TEST STRIPS Strp Use 1 test strip daily in the morning, at noon, and at bedtime.    BLOOD GLUCOSE TEST STRIPS Strp 1 each by Does not apply route 3 (three) times daily. Use as directed to check blood sugar. May dispense any manufacturer covered by patient's insurance and fits patient's device.   doxepin  25 MG capsule Commonly known as: SINEQUAN  TAKE 1 CAPSULE BY MOUTH EVERYDAY AT BEDTIME   FreeStyle Libre 3 Sensor Misc Place 1 sensor on the skin every 14 days. Use to check glucose continuously   insulin  aspart protamine - aspart (  70-30) 100 UNIT/ML FlexPen Commonly known as: NOVOLOG  70/30 MIX Inject 45 Units into the skin 2 (two) times daily with a meal. What changed: how much to take Changed by: Butler Nesta Kimple   Lancet Device Misc 1 each by Does not apply route in the morning, at noon, and at bedtime. May substitute to any manufacturer covered by patient's insurance.   ONE TOUCH DELICA LANCING DEV Misc Use in the morning, at noon, and at bedtime.   Lancets Misc 1 each by Does not apply route 3 (three) times daily. Use as directed to check blood sugar. May dispense any manufacturer covered by patient's insurance and fits patient's device. What changed: Another medication with the same name was removed. Continue taking this medication, and follow the directions you see here. Changed by: Yurianna Tusing   [employer] Misc 1 each by Does not apply route in the morning, at noon, and at bedtime. May substitute to any manufacturer covered by patient's insurance.   lisinopril  20 MG tablet Commonly known as: ZESTRIL  Take 1 tablet (20 mg total) by mouth daily.   metFORMIN  500 MG 24 hr tablet Commonly known as: GLUCOPHAGE -XR Take 1 tablet (500 mg total) by mouth 2 (two) times daily with a meal.   rosuvastatin  10 MG tablet Commonly known as: Crestor  Take 1 tablet (10 mg total) by mouth daily.   tirzepatide  2.5 MG/0.5ML Pen Commonly known as: MOUNJARO  Inject 2.5 mg into the skin once a week. Started by: Josede Cicero   Vitamin D  (Ergocalciferol ) 1.25 MG (50000  UNIT) Caps capsule Commonly known as: DRISDOL  Take 1 capsule (50,000 Units total) by mouth every 7 (seven) days. What changed: additional instructions         Follow-up: Return in about 2 weeks (around 04/07/2023).  Butler Der, M.D.

## 2023-03-25 ENCOUNTER — Other Ambulatory Visit (HOSPITAL_BASED_OUTPATIENT_CLINIC_OR_DEPARTMENT_OTHER): Payer: Self-pay

## 2023-03-26 ENCOUNTER — Ambulatory Visit: Payer: Self-pay | Admitting: Family Medicine

## 2023-03-26 NOTE — Telephone Encounter (Signed)
 Reason for Disposition  Blood glucose 70-240 mg/dL (3.9 -86.6 mmol/L)  Protocols used: Diabetes - High Blood Sugar-A-AH Chief Complaint: Low blood glucose Symptoms: low bgl Frequency: since stating new medication Pertinent Negatives: Patient denies NA Disposition: [] ED /[] Urgent Care (no appt availability in office) / [] Appointment(In office/virtual)/ []  [facility]/ [] Home Care/ [] Refused Recommended Disposition /[] [facility]/ []  Follow-up with PCP Additional Notes: Patient would like a call back to discuss his new medication and consistently low blood glucose levels.  Patient would like to know if he should cut the insulin  back.  Office to follow up with patient.    Answer Assessment - Initial Assessment Questions 1. BLOOD GLUCOSE: What is your blood glucose level?       115 then took inulin ate  then 145  At 915 am  decreased to 130   1030 am  69 and continued  to 57. He ate some yogurt.73 2. ONSET: When did you check the blood glucose?      Last time  1045 am   now 73 and  now 86 after eating yogurt 3. USUAL RANGE: What is your glucose level usually? (e.g., usual fasting morning value, usual evening value)      124 - 170   1115 am 69  1200 pm 60  1230   103 5 pm took  medication 87 6  dropped to 65 1040 pm 73  3 am this morning dropped  55. Now  4. KETONES: Do you check for ketones (urine or blood test strips)? If Yes, ask: What does the test show now?      NA 5. TYPE 1 or 2:  Do you know what type of diabetes you have?  (e.g., Type 1, Type 2, Gestational; doesn't know)       Type 2  6. INSULIN : Do you take insulin ? What type of insulin (s) do you use? What is the mode of delivery? (syringe, pen; injection or pump)?       Started on  70/30 [place]jurao 2.5 7. DIABETES PILLS: Do you take any pills for your diabetes? If Yes, ask: Have you missed taking any pills recently?      Metformin   twice  a day 500 mg 8. OTHER SYMPTOMS: Do you have any  symptoms? (e.g., fever, frequent urination, difficulty breathing, dizziness, weakness, vomiting)      Denies  Protocols used: Diabetes - High Blood Sugar-A-AH

## 2023-03-26 NOTE — Telephone Encounter (Signed)
 Spoke to patient re: low BGs.  Reporting BGs into the 50-60s with Novolog  mix 70/30 at 45u BID, Metformin  XR 500mg  BID, Mounjaro  2.5mg .  Confirming low BGs on [place] with fingerstick and readings are within 4 points of eachother.  Advised to reduce Meformin to once daily and reduce insulin  to 40u BID.  Call over weekend if continues to drop. Would recommend reducing insulin  further if needed.  Asked that he send Mychart message with weekend readings on Monday.  Patient was able to repeat instructions and indications for reevaluation over weekend.

## 2023-03-26 NOTE — Telephone Encounter (Signed)
 Tyler Pruitt patient  Patient is having issues with hypoglycemic episodes.  He saw Dr Zollie last week and some adjustments were made to his medications and he is now taking:  Novolog  70/30 45 units BID Metformin  500 mg BID Mounjaro  2.5 mg weekly  Patient is having bedtime incidents of his blood sugars dropping into the 50-60's, happened four times last night.  But also is having hypoglycemic episodes this morning while he is awake within that same range.  It has happened 2-3 times this morning.  Patient is unsure what he should do or what changes to make to his medication regimen.  Please advise.

## 2023-03-26 NOTE — Telephone Encounter (Signed)
  Answer Assessment - Initial Assessment Questions 1. BLOOD GLUCOSE: What is your blood glucose level?       115 then took inulin ate  then 145  At 915 am  decreased to 130   1030 am  69 and continued  to 57. He ate some yogurt.73 2. ONSET: When did you check the blood glucose?      Last time  1045 am   now 73 and  now 86 after eating yogurt 3. USUAL RANGE: What is your glucose level usually? (e.g., usual fasting morning value, usual evening value)      124 - 170   1115 am 69  1200 pm 60  1230   103 5 pm took  medication 87 6  dropped to 65 1040 pm 73  3 am this morning dropped  55. Now  4. KETONES: Do you check for ketones (urine or blood test strips)? If Yes, ask: What does the test show now?      NA 5. TYPE 1 or 2:  Do you know what type of diabetes you have?  (e.g., Type 1, Type 2, Gestational; doesn't know)       Type 2  6. INSULIN : Do you take insulin ? What type of insulin (s) do you use? What is the mode of delivery? (syringe, pen; injection or pump)?       Started on  70/30 [place]o 2.5 7. DIABETES PILLS: Do you take any pills for your diabetes? If Yes, ask: Have you missed taking any pills recently?      Metformin   twice  a day 500 mg 8. OTHER SYMPTOMS: Do you have any symptoms? (e.g., fever, frequent urination, difficulty breathing, dizziness, weakness, vomiting)      Denies  Protocols used: Diabetes - High Blood Sugar-A-AH

## 2023-03-29 ENCOUNTER — Other Ambulatory Visit: Payer: Self-pay | Admitting: Family Medicine

## 2023-03-29 ENCOUNTER — Ambulatory Visit: Payer: Medicare HMO | Admitting: Family Medicine

## 2023-03-29 DIAGNOSIS — I10 Essential (primary) hypertension: Secondary | ICD-10-CM

## 2023-03-29 MED ORDER — LISINOPRIL 10 MG PO TABS: 10.0000 mg | ORAL_TABLET | Freq: Every day | ORAL | 0 refills | Status: DC

## 2023-03-29 NOTE — Telephone Encounter (Signed)
 Patient states what if blood sugar goes up? How much insulin does he need to take? Also, Can metformin cause blurry vision. Reports felt lightheaded yesterday and BP was 78/58.

## 2023-03-29 NOTE — Telephone Encounter (Signed)
 Patient reports he was still having his blood sugar dropping. Reports over the weekend he decided to go back to two metformin daily and no insulin. Is still going up and down.

## 2023-03-29 NOTE — Telephone Encounter (Signed)
 Patient wants to know if you want to change bp medication Lisinopril to 10 for now since his BP dropped low. If so he needs it sent to pharmacy.

## 2023-03-29 NOTE — Telephone Encounter (Signed)
 Please let the patient know that I sent their prescription to their pharmacy. Thanks, WS

## 2023-03-29 NOTE — Telephone Encounter (Signed)
 Patient aware.

## 2023-03-29 NOTE — Telephone Encounter (Signed)
 Check to see if he had problems with low glucose over the weekend.

## 2023-03-29 NOTE — Telephone Encounter (Signed)
 Continue mounjaro! Okay to DC insulin as long as glucose stays under 200 for now.

## 2023-03-29 NOTE — Telephone Encounter (Signed)
 Blurry vision is usually from the glucose going up rapidly. Lets go with 20 units daily for now. Follow up soon.

## 2023-03-30 ENCOUNTER — Other Ambulatory Visit: Payer: Self-pay | Admitting: *Deleted

## 2023-03-30 NOTE — Telephone Encounter (Signed)
 Patient aware.

## 2023-03-30 NOTE — Patient Outreach (Signed)
 Care Management  Transitions of Care Program Transitions of Care Post-discharge week 2   03/30/2023 Name: Tyler Pruitt. MRN: [id number] DOB: 07-Jun-1955  Subjective: Tyler Pruitt. is a 68 y.o. year old male who is a primary care patient of Stacks, Butler, MD. The Care Management team Engaged with patient Engaged with patient by telephone to assess and address transitions of care needs.   Consent to Services:  Patient was given information about care management services, agreed to services, and gave verbal consent to participate.   Assessment: Pt saw primary care provider 03/24/23 and medication changes made, no hypoglycemia x 2 days, further med changes were made and pt is on 20 units Novolog  BID and feels this is working much better, pt has eye exam 1/15 and primary care provider follow up 03/31/23.         SDOH Interventions    Flowsheet Row Telephone from 03/22/2023 in [facility] Office Visit from 07/30/2020 in [facility]  SDOH Interventions    Food Insecurity Interventions Intervention Not Indicated --  Housing Interventions Intervention Not Indicated --  Transportation Interventions Intervention Not Indicated --  Utilities Interventions Intervention Not Indicated --  Depression Interventions/Treatment  -- PHQ2-9 Score <4 Follow-up Not Indicated        Goals Addressed             This Visit's Progress    Transition of Care/ pt will have no readmissions within 30 days       Current Barriers:  Knowledge Deficits related to plan of care for management of DMII  Patient reports he monitors CBG with Freestyle Libre and also checks with glucometer as needed, pt reports he has had several readings of 69 that drops at bedtime, pt states he eats a snack and CBG elevates quickly, pt reports he talked with primary care provider office and has appointment for 03/24/23 and will discuss further, will also see dietician  on 04/07/23. Update 03/30/23- pt saw primary care provider on 03/24/23 and medication changes made and changes made after that date also, pt states he is on Novolog  20 units BID,  FBS 81 today and yesterday, RBS ranges 137-240, pt states no hypoglycemia with 20 units Novolog  and will contact provider if any further low readings, pt continues on Metformin  and Mounjaro .  Pt is scheduled to have eye exam 03/31/23.  RNCM Clinical Goal(s):  Patient will work with the Care Management team over the next 30 days to address Transition of Care Barriers: Diet/Nutrition/Food Resources verbalize understanding of plan for management of DMII as evidenced by patient report, review of EMR and  through collaboration with RN Care manager, provider, and care team.   Interventions: Evaluation of current treatment plan related to  self management and patient's adherence to plan as established by provider   Diabetes Interventions:  (Status:  New goal. and Goal on track:  Yes.) Short Term Goal Assessed patient's understanding of A1c goal: <7% Reviewed medications with patient and discussed importance of medication adherence Counseled on importance of regular laboratory monitoring as prescribed Discussed plans with patient for ongoing care management follow up and provided patient with direct contact information for care management team Review of patient status, including review of consultants reports, relevant laboratory and other test results, and medications completed Reinforced signs/ symptoms of hypoglycemia and treatment Reinforced fasting blood sugar goals of 80-130 and less than 180 1 1/2-2 hours after meals Reinforced carbohydrate modified diet/ plate  method with patient Reviewed CBG log with patient Reviewed upcoming scheduled appointments Lab Results  Component Value Date   HGBA1C 13.7 (H) 03/15/2023    Patient Goals/Self-Care Activities: Participate in Transition of Care Program/Attend TOC scheduled  calls Notify RN Care Manager of TOC call rescheduling needs Take all medications as prescribed Attend all scheduled provider appointments Call pharmacy for medication refills 3-7 days in advance of running out of medications Perform all self care activities independently  Call provider office for new concerns or questions  check blood sugar at prescribed times: per [facility] check feet daily for cuts, sores or redness enter blood sugar readings and medication or insulin  into daily log take the blood sugar log to all doctor visits trim toenails straight across fill half of plate with vegetables manage portion size prepare main meal at home 3 to 5 days each week wash and dry feet carefully every day wear comfortable, cotton socks wear comfortable, well-fitting shoes Follow up with Nutrition Diabetes Management on 04/07/23  @ 445 pm Follow up with primary care provider on 03/31/23 Follow plate method, be mindful of carbohydrate intake at each meal Follow RULE OF 15 for low blood sugar management:  How to treat low blood sugars (Blood sugar less than 70 mg/dl  Please follow the RULE OF 15 for the treatment of hypoglycemia treatment (When your blood sugars are less than 70 mg/ dl) STEP  1:  Take 15 grams of carbohydrates when your blood sugar is low, which includes One tube of glucose gel STEP 2:  Recheck blood sugar in 15 minutes STEP 3:  3-4 glucose tabs or  3-4 oz of juice or regular soda or If your blood sugar is still low at the 15 minute recheck ---then, go back to STEP 1 and treat again with another 15 grams of carbohydrates  Follow Up Plan:  Telephone follow up appointment with care management team member scheduled for:  04/06/23 @ 10 am          Plan: Telephone follow up appointment with care management team member scheduled for: 04/06/23 @ 10 am  Mliss Creed [facility], BSN RN Care Manager/ Transition of Care [place] [phone]

## 2023-03-30 NOTE — Patient Instructions (Signed)
 Visit Information  Thank you for taking time to visit with me today. Please don't hesitate to contact me if I can be of assistance to you before our next scheduled telephone appointment.  Following are the goals we discussed today:   Goals Addressed             This Visit's Progress    Transition of Care/ pt will have no readmissions within 30 days       Current Barriers:  Knowledge Deficits related to plan of care for management of DMII  Patient reports he monitors CBG with Freestyle Libre and also checks with glucometer as needed, pt reports he has had several readings of 69 that drops at bedtime, pt states he eats a snack and CBG elevates quickly, pt reports he talked with primary care provider office and has appointment for 03/24/23 and will discuss further, will also see dietician on 04/07/23. Update 03/30/23- pt saw primary care provider on 03/24/23 and medication changes made and changes made after that date also, pt states he is on Novolog  20 units BID,  FBS 81 today and yesterday, RBS ranges 137-240, pt states no hypoglycemia with 20 units Novolog  and will contact provider if any further low readings, pt continues on Metformin  and Mounjaro .  Pt is scheduled to have eye exam 03/31/23.  RNCM Clinical Goal(s):  Patient will work with the Care Management team over the next 30 days to address Transition of Care Barriers: Diet/Nutrition/Food Resources verbalize understanding of plan for management of DMII as evidenced by patient report, review of EMR and  through collaboration with RN Care manager, provider, and care team.   Interventions: Evaluation of current treatment plan related to  self management and patient's adherence to plan as established by provider   Diabetes Interventions:  (Status:  New goal. and Goal on track:  Yes.) Short Term Goal Assessed patient's understanding of A1c goal: <7% Reviewed medications with patient and discussed importance of medication adherence Counseled on  importance of regular laboratory monitoring as prescribed Discussed plans with patient for ongoing care management follow up and provided patient with direct contact information for care management team Review of patient status, including review of consultants reports, relevant laboratory and other test results, and medications completed Reinforced signs/ symptoms of hypoglycemia and treatment Reinforced fasting blood sugar goals of 80-130 and less than 180 1 1/2-2 hours after meals Reinforced carbohydrate modified diet/ plate method with patient Reviewed CBG log with patient Reviewed upcoming scheduled appointments Lab Results  Component Value Date   HGBA1C 13.7 (H) 03/15/2023    Patient Goals/Self-Care Activities: Participate in Transition of Care Program/Attend [facility] scheduled calls Notify RN Care Manager of TOC call rescheduling needs Take all medications as prescribed Attend all scheduled provider appointments Call pharmacy for medication refills 3-7 days in advance of running out of medications Perform all self care activities independently  Call provider office for new concerns or questions  check blood sugar at prescribed times: per [facility] check feet daily for cuts, sores or redness enter blood sugar readings and medication or insulin  into daily log take the blood sugar log to all doctor visits trim toenails straight across fill half of plate with vegetables manage portion size prepare main meal at home 3 to 5 days each week wash and dry feet carefully every day wear comfortable, cotton socks wear comfortable, well-fitting shoes Follow up with Nutrition Diabetes Management on 04/07/23  @ 445 pm Follow up with primary care provider on 03/31/23 Follow  plate method, be mindful of carbohydrate intake at each meal Follow RULE OF 15 for low blood sugar management:  How to treat low blood sugars (Blood sugar less than 70 mg/dl  Please follow the RULE OF 15 for the treatment  of hypoglycemia treatment (When your blood sugars are less than 70 mg/ dl) STEP  1:  Take 15 grams of carbohydrates when your blood sugar is low, which includes One tube of glucose gel STEP 2:  Recheck blood sugar in 15 minutes STEP 3:  3-4 glucose tabs or  3-4 oz of juice or regular soda or If your blood sugar is still low at the 15 minute recheck ---then, go back to STEP 1 and treat again with another 15 grams of carbohydrates  Follow Up Plan:  Telephone follow up appointment with care management team member scheduled for:  04/06/23 @ 10 am           Our next appointment is by telephone on 1/212/25 at 10 am  Please call the care guide team at [phone] if you need to cancel or reschedule your appointment.   If you are experiencing a Mental Health or Behavioral Health Crisis or need someone to talk to, please call the Suicide and Crisis Lifeline: 988 call the [place]  National Suicide Prevention Lifeline: 337-565-3834 or TTY: 929-270-6156 TTY 9021321207) to talk to a trained counselor call 1-800-273-TALK (toll free, 24 hour hotline) go to [facility] [address], [place] [phone]) call the [facility] Crisis Line: [phone] call 911   Patient verbalizes understanding of instructions and care plan provided today and agrees to view in MyChart. Active MyChart status and patient understanding of how to access instructions and care plan via MyChart confirmed with patient.     Mliss Creed [facility], BSN RN Care Manager/ Transition of Care [place]/ [facility] [phone]

## 2023-03-31 ENCOUNTER — Ambulatory Visit (INDEPENDENT_AMBULATORY_CARE_PROVIDER_SITE_OTHER): Payer: Medicare HMO | Admitting: Family Medicine

## 2023-03-31 ENCOUNTER — Telehealth: Payer: Self-pay | Admitting: Family Medicine

## 2023-03-31 ENCOUNTER — Encounter: Payer: Self-pay | Admitting: Family Medicine

## 2023-03-31 VITALS — BP 111/67 | HR 101 | Temp 97.4°F | Ht 62.0 in | Wt 184.0 lb

## 2023-03-31 DIAGNOSIS — E119 Type 2 diabetes mellitus without complications: Secondary | ICD-10-CM | POA: Diagnosis not present

## 2023-03-31 DIAGNOSIS — E1110 Type 2 diabetes mellitus with ketoacidosis without coma: Secondary | ICD-10-CM

## 2023-03-31 DIAGNOSIS — H40013 Open angle with borderline findings, low risk, bilateral: Secondary | ICD-10-CM | POA: Diagnosis not present

## 2023-03-31 DIAGNOSIS — H25813 Combined forms of age-related cataract, bilateral: Secondary | ICD-10-CM | POA: Diagnosis not present

## 2023-03-31 DIAGNOSIS — H35033 Hypertensive retinopathy, bilateral: Secondary | ICD-10-CM | POA: Diagnosis not present

## 2023-03-31 LAB — HM DIABETES EYE EXAM

## 2023-03-31 MED ORDER — LANTUS SOLOSTAR 100 UNIT/ML ~~LOC~~ SOPN: 30.0000 [IU] | PEN_INJECTOR | SUBCUTANEOUS | 99 refills | Status: DC

## 2023-03-31 MED ORDER — METFORMIN HCL ER 500 MG PO TB24: 500.0000 mg | ORAL_TABLET | Freq: Every day | ORAL | Status: DC

## 2023-03-31 NOTE — Progress Notes (Signed)
Subjective:  Patient ID: Tyler Pruitt., male    DOB: 03/16/1956  Age: 68 y.o. MRN: [id number]  CC: blood surgar (Would liek to discuss getting sugar under control. )   HPI Tyler Pruitt. presents forFollow-up of diabetes. Patient checks blood sugar at home.  Tried to go without insulin, but glucose peaked. With it the night time readings are too low. 50-80. Recently admitted to hospital for ketoacidosis Patient denies symptoms such as polyuria, polydipsia, excessive hunger, nausea No significant hypoglycemic spells noted. Medications reviewed. Pt reports taking them regularly without complication/adverse reaction being reported today.    History Reyden has a past medical history of Anxiety, Arthritis, Diabetes (HCC), Fatty liver, GAD (generalized anxiety disorder), GERD (gastroesophageal reflux disease), Hyperlipidemia, Hypertension, IBS (irritable bowel syndrome), Low testosterone, Palpitations, Palpitations, and Vitamin D deficiency.   He has a past surgical history that includes None; Colonoscopy; Polypectomy; and Carpal tunnel release (Right).   His family history includes Heart disease (age of onset: 48) in his father; Rheum arthritis in his mother.He reports that he quit smoking about 31 years ago. His smoking use included cigarettes. He has never been exposed to tobacco smoke. He has never used smokeless tobacco. He reports current alcohol use of about 2.0 - 3.0 standard drinks of alcohol per week. He reports that he does not use drugs.  Current Outpatient Medications on File Prior to Visit  Medication Sig Dispense Refill   Blood Glucose Monitoring Suppl (ONETOUCH VERIO FLEX SYSTEM) w/Device KIT 1 each by Does not apply route in the morning, at noon, and at bedtime. 1 kit 0   Blood Glucose Monitoring Suppl DEVI 1 each by Does not apply route in the morning, at noon, and at bedtime. May substitute to any manufacturer covered by patient's insurance. 1 each 0   Continuous  Glucose Sensor (FREESTYLE LIBRE 3 SENSOR) MISC Place 1 sensor on the skin every 14 days. Use to check glucose continuously 2 each 0   doxepin (SINEQUAN) 25 MG capsule TAKE 1 CAPSULE BY MOUTH EVERYDAY AT BEDTIME 90 capsule 3   Glucose Blood (BLOOD GLUCOSE TEST STRIPS) STRP 1 each by In Vitro route in the morning, at noon, and at bedtime. May substitute to any manufacturer covered by patient's insurance. 100 strip 11   Glucose Blood (BLOOD GLUCOSE TEST STRIPS) STRP Use 1 test strip daily in the morning, at noon, and at bedtime. 100 each 0   Glucose Blood (BLOOD GLUCOSE TEST STRIPS) STRP 1 each by Does not apply route 3 (three) times daily. Use as directed to check blood sugar. May dispense any manufacturer covered by patient's insurance and fits patient's device. 100 strip 0   Insulin Pen Needle 32G X 4 MM MISC Use to check blood sugars 2 (two) times daily. 100 each 0   Lancet Device MISC 1 each by Does not apply route in the morning, at noon, and at bedtime. May substitute to any manufacturer covered by patient's insurance. 1 each 0   Lancet Devices (ONE TOUCH DELICA LANCING DEV) MISC Use in the morning, at noon, and at bedtime. 1 each 0   Lancets Misc. MISC 1 each by Does not apply route in the morning, at noon, and at bedtime. May substitute to any manufacturer covered by patient's insurance. 100 each 0   Lancets MISC 1 each by Does not apply route 3 (three) times daily. Use as directed to check blood sugar. May dispense any manufacturer covered by patient's insurance and fits patient's  device. 100 each 0   rosuvastatin (CRESTOR) 10 MG tablet Take 1 tablet (10 mg total) by mouth daily. 90 tablet 3   tirzepatide (MOUNJARO) 2.5 MG/0.5ML Pen Inject 2.5 mg into the skin once a week. 2 mL 0   Vitamin D, Ergocalciferol, (DRISDOL) 1.25 MG (50000 UNIT) CAPS capsule Take 1 capsule (50,000 Units total) by mouth every 7 (seven) days. (Patient taking differently: Take 50,000 Units by mouth every 7 (seven) days.  Saturday, last dose was on 12-28-20204.) 13 capsule 3   No current facility-administered medications on file prior to visit.    ROS Review of Systems  Constitutional:  Negative for fever.  Respiratory:  Negative for shortness of breath.   Cardiovascular:  Negative for chest pain.  Skin:  Negative for rash.    Objective:  BP 111/67   Pulse (!) 101   Temp (!) 97.4 F (36.3 C) (Temporal)   Ht 5\' 2"  (1.575 m)   Wt 184 lb (83.5 kg)   SpO2 98%   BMI 33.65 kg/m   BP Readings from Last 3 Encounters:  03/31/23 111/67  03/24/23 110/67  03/20/23 (!) 161/85    Wt Readings from Last 3 Encounters:  03/31/23 184 lb (83.5 kg)  03/24/23 188 lb (85.3 kg)  03/16/23 187 lb 6.3 oz (85 kg)     Physical Exam Vitals reviewed.  Constitutional:      Appearance: He is well-developed.  HENT:     Head: Normocephalic and atraumatic.     Right Ear: External ear normal.     Left Ear: External ear normal.     Mouth/Throat:     Pharynx: No oropharyngeal exudate or posterior oropharyngeal erythema.  Eyes:     Pupils: Pupils are equal, round, and reactive to light.  Cardiovascular:     Rate and Rhythm: Normal rate and regular rhythm.     Heart sounds: No murmur heard. Pulmonary:     Effort: No respiratory distress.     Breath sounds: Normal breath sounds.  Musculoskeletal:     Cervical back: Normal range of motion and neck supple.  Neurological:     Mental Status: He is alert and oriented to person, place, and time.       Assessment & Plan:   [name] was seen today for blood surgar.  Diagnoses and all orders for this visit:  Diabetic ketoacidosis without coma associated with type 2 diabetes mellitus (HCC)  Other orders -     insulin glargine (LANTUS SOLOSTAR) 100 UNIT/ML Solostar Pen; Inject 30 Units into the skin every morning. -     metFORMIN (GLUCOPHAGE-XR) 500 MG 24 hr tablet; Take 1 tablet (500 mg total) by mouth daily with breakfast.    35 minutes spent with pt. Over 1/2  in counselin regarding proper usage of insulin, adjusting dose according to monitor readings.  I have discontinued Ladona Mow Jr.'s insulin aspart protamine - aspart and lisinopril. I have also changed his metFORMIN. Additionally, I am having him start on Lantus SoloStar. Lastly, I am having him maintain his Vitamin D (Ergocalciferol), Blood Glucose Monitoring Suppl, Lancet Device, Lancets Misc., BLOOD GLUCOSE TEST STRIPS, OneTouch Verio Flex System, BLOOD GLUCOSE TEST STRIPS, ONE TOUCH DELICA LANCING DEV, BLOOD GLUCOSE TEST STRIPS, Lancets, FreeStyle Libre 3 Sensor, Insulin Pen Needle, doxepin, rosuvastatin, and tirzepatide.  Meds ordered this encounter  Medications   insulin glargine (LANTUS SOLOSTAR) 100 UNIT/ML Solostar Pen    Sig: Inject 30 Units into the skin every morning.  Dispense:  15 mL    Refill:  PRN    Cancel scrip for 70/30 insulin   metFORMIN (GLUCOPHAGE-XR) 500 MG 24 hr tablet    Sig: Take 1 tablet (500 mg total) by mouth daily with breakfast.     Follow-up: Return in about 1 week (around 04/07/2023) for diabetes.  Mechele Claude, M.D.

## 2023-04-01 ENCOUNTER — Ambulatory Visit: Payer: Self-pay | Admitting: Family Medicine

## 2023-04-01 NOTE — Telephone Encounter (Signed)
  Chief Complaint: Back Pain for the span of 1.5 hours Symptoms: cramping back pain after giving himself first dose of new insulin Frequency: gave himself his insulin at 8:00-developed the back pain shortly after Pertinent Negatives: Patient denies no back pain at time of call. States back pain was gone after 1.5 hours Disposition: [] ED /[] Urgent Care (no appt availability in office) / [] Appointment(In office/virtual)/ []  [place] [facility]/ [] Home Care/ [] Refused Recommended Disposition /[] [place] [facility]/ [x]  Follow-up with PCP Additional Notes: patient started on new insulin yesterday. Patient started his insulin this morning-first injection at 8:00 am. Patient states shortly after his injection while eating breakfast, developed left sided back pain that was intermittent and cramping in nature. Patient states back pain went completely away within an hour and a half. Currently having no symptoms but wanted to let his PCP know what had happened. Patient is requesting a call back from PCP team to discuss his symptom. All questions answered.    Copied from CRM [id number]. Topic: General - Other >> Apr 01, 2023  9:26 AM Alphonzo Lemmings O wrote: Reason for CRM: patient is calling went to see doctor and the new insulin  the doctor gave me is giving me back pain like cramping up on left side of back. Patient is wanting to speak with doctor to see what to do .  Call back number [phone] Reason for Disposition  [1] Caller has NON-URGENT medicine question about med that PCP prescribed AND [2] triager unable to answer question  Answer Assessment - Initial Assessment Questions 1. NAME of MEDICINE: "What medicine(s) are you calling about?"     Lantus 2. QUESTION: "What is your question?" (e.g., double dose of medicine, side effect)     Patient called stating after taking his first dose of this medication, he developed cramping back pain on the left side of his back. Patient states pain lasted for a hour  and a half intermittently 3. PRESCRIBER: "Who prescribed the medicine?" Reason: if prescribed by specialist, call should be referred to that group.     Stacks 4. SYMPTOMS: "Do you have any symptoms?" If Yes, ask: "What symptoms are you having?"  "How bad are the symptoms (e.g., mild, moderate, severe)     Currently no symptoms. Patient states pain went away and he hasn't had any symptoms-wanted to let the doctor know  Protocols used: Medication Question Call-A-AH

## 2023-04-01 NOTE — Telephone Encounter (Signed)
Copied from CRM [id number]. Topic: Clinical - Red Word Triage >> Apr 01, 2023  2:11 PM Eunice Blase wrote: Red Word that prompted transfer to Nurse Triage:Pt call regarding change of medication to insulin glargine (LANTUS SOLOSTAR) 100 UNIT/ML Solostar Pen, pt has pain back.   Chief Complaint: Left sided back pain Symptoms: Pain Frequency: since this morning around 8:30am Pertinent Negatives: Patient denies numbness, weakness, issues with bowel/bladder, blood in urine, pain with urination, fever Disposition: [] ED /[] Urgent Care (no appt availability in office) / [] Appointment(In office/virtual)/ []  [place] [facility]/ [] Home Care/ [x] Refused Recommended Disposition /[] [place] [facility]/ []  Follow-up with PCP Additional Notes: Patient called and advised that his PCP changed his Insulin.  Pt started cramping up on his left side of his back.  It eased up but then started back.  Patient has been walking up and down the hall so it eased off.  Patient states that his symptoms started this morning after taking his medication that was changed.  Patient just wants his PCP to advise him whether he wants him to stop taking his new medication and go back to his old medication or what does he want him to do.  Patient states he called this morning and hadn't heard back and the pain eased off at that time.  He said the pain came back and that's what made him want to call back.  He said as we spoke that the pain was easing off.  He described it as a cramping and denies any blood in his urine, painful urination, bowel/bladder issues, numbness, weakness, or fevers. Patient is advised that if the pain gets worse, he starts to develop a fever, bowel/bladder problems, urinary symptoms, or numbness/weakness to go to the emergency room to be evaluated.  Patient verbalized understanding.  Reason for Disposition  [1] Age > 50 AND [2] no history of prior similar back pain  Answer Assessment - Initial Assessment  Questions 1. ONSET: "When did the pain begin?"      8:30am this morning, lasted an hour-hour and half, eased off, but then it came back 2. LOCATION: "Where does it hurt?" (upper, mid or lower back)     Left side of back 3. SEVERITY: "How bad is the pain?"  (e.g., Scale 1-10; mild, moderate, or severe)   - MILD (1-3): Doesn't interfere with normal activities.    - MODERATE (4-7): Interferes with normal activities or awakens from sleep.    - SEVERE (8-10): Excruciating pain, unable to do any normal activities.      4 4. PATTERN: "Is the pain constant?" (e.g., yes, no; constant, intermittent)      Comes and goes   more constant now 5. RADIATION: "Does the pain shoot into your legs or somewhere else?"     Left side of back--non radiating 6. CAUSE:  "What do you think is causing the back pain?"      Unknown 7. BACK OVERUSE:  "Any recent lifting of heavy objects, strenuous work or exercise?"     No 8. MEDICINES: "What have you taken so far for the pain?" (e.g., nothing, acetaminophen, NSAIDS)     No 9. NEUROLOGIC SYMPTOMS: "Do you have any weakness, numbness, or problems with bowel/bladder control?"     No 10. OTHER SYMPTOMS: "Do you have any other symptoms?" (e.g., fever, abdomen pain, burning with urination, blood in urine)       No  Protocols used: Back Pain-A-AH

## 2023-04-01 NOTE — Telephone Encounter (Signed)
Continue insulin. Sounds more like a kidney stone or infection. Insulin doesn't cause back pain. Needs to be checked out. Continue the insulin!!!

## 2023-04-02 ENCOUNTER — Encounter: Payer: Self-pay | Admitting: Family Medicine

## 2023-04-02 NOTE — Telephone Encounter (Signed)
 No answer and vmail not setup.

## 2023-04-06 ENCOUNTER — Other Ambulatory Visit: Payer: Self-pay

## 2023-04-06 NOTE — Patient Outreach (Signed)
Care Management  Transitions of Care Program Transitions of Care Post-discharge week 3   04/06/2023 Name: Tyler Pruitt. MRN: [id number] DOB: [birth year]  Subjective: Tyler Pruitt. is a 68 y.o. year old male who is a primary care patient of Stacks, Broadus Dondrell, MD. The Care Management team Engaged with patient Engaged with patient by telephone to assess and address transitions of care needs.   Consent to Services:  Patient was given information about care management services, agreed to services, and gave verbal consent to participate.   Assessment: Reports CBG up and down. Patient reports CBG fasting 93-108 the past few days.  Patient reports he was woke up 2 times on Sunday night with alarms of low CBG.  Reports that he got up and ate something. Last night patient ate a snack of yogurt before bedtime and his blood sugar stayed up during the night.  Patient is pending an appointment with nutritionist.  Patient stays that he is working hard to get DM under good control.  Patient also reports recent low Blood pressure and he is taking 10 mg of lisinopril vs 20mg .  Patient reports that he did see eye doctor and his pressures were good. Reports eye doctor told him that he eyes were good and to give it a month for his vision to return to normal.  Patient reports that he has to wear readers since his hospital admission.  Patient denies any new concerns today.          SDOH Interventions    Flowsheet Row Telephone from 03/22/2023 in [place] [facility] Office Visit from 07/30/2020 in [facility]  SDOH Interventions    Food Insecurity Interventions Intervention Not Indicated --  Housing Interventions Intervention Not Indicated --  Transportation Interventions Intervention Not Indicated --  Utilities Interventions Intervention Not Indicated --  Depression Interventions/Treatment  -- GLO7-5 Score <4 Follow-up Not Indicated       Goals  Addressed             This Visit's Progress    Transition of Care/ pt will have no readmissions within 30 days       Current Barriers:  Knowledge Deficits related to plan of care for management of DMII 04/06/2023  Patient reports that he is doing well managing his CBG at home. Reports that he is in contact with MD to adjust his medications.  Patient reports he monitors CBG with Freestyle Libre and also checks with glucometer as needed, pt reports he has had several readings of 69 that drops at bedtime, pt states he eats a snack and CBG elevates quickly, pt reports he talked with primary care provider office and has appointment for 03/24/23 and will discuss further, will also see dietician on 04/07/23.Marland Kitchen   04/06/2023- reports that he has done finger sticks and used his phone and readings are accurate.   He continues to monitor and is now eating a bedtime snack to help with low readings during the night.  Update 03/30/23- pt saw primary care provider on 03/24/23 and medication changes made and changes made after that date also, pt states he is on Novolog 20 units BID,  FBS 81 today and yesterday, RBS ranges 137-240, pt states no hypoglycemia with 20 units Novolog and will contact provider if any further low readings, pt continues on Metformin and Mounjaro.  Pt is scheduled to have eye exam 03/31/23.  04/06/2023  Eye exam completed.   RNCM Clinical Goal(s):  Patient will work with the Care Management team over the next 30 days to address Transition of Care Barriers: Diet/Nutrition/Food Resources verbalize understanding of plan for management of DMII as evidenced by patient report, review of EMR and  through collaboration with RN Care manager, provider, and care team.   Interventions: Evaluation of current treatment plan related to  self management and patient's adherence to plan as established by provider   Diabetes Interventions:  (Status:  Goal on track:  Yes.) Short Term Goal Assessed patient's  understanding of A1c goal: <7% Reviewed medications with patient and discussed importance of medication adherence Counseled on importance of regular laboratory monitoring as prescribed Discussed plans with patient for ongoing care management follow up and provided patient with direct contact information for care management team Review of patient status, including review of consultants reports, relevant laboratory and other test results, and medications completed Reinforced signs/ symptoms of hypoglycemia and treatment Reinforced fasting blood sugar goals of 80-130 and less than 180 1 1/2-2 hours after meals Reinforced carbohydrate modified diet/ plate method with patient Reviewed CBG log with patient Reviewed upcoming scheduled appointments Reviewed importance of bedtime snack to include protein to stabilize blood sugar at night.  Encouraged patient to see the dietitian for assistance with meals and snacks.  Lab Results  Component Value Date   HGBA1C 13.7 (H) 03/15/2023    Patient Goals/Self-Care Activities: Participate in Transition of Care Program/Attend [facility] scheduled calls Notify RN Care Manager of TOC call rescheduling needs Take all medications as prescribed Attend all scheduled provider appointments Call pharmacy for medication refills 3-7 days in advance of running out of medications Perform all self care activities independently  Call provider office for new concerns or questions  check blood sugar at prescribed times: per [facility] check feet daily for cuts, sores or redness enter blood sugar readings and medication or insulin into daily log take the blood sugar log to all doctor visits trim toenails straight across fill half of plate with vegetables manage portion size prepare main meal at home 3 to 5 days each week wash and dry feet carefully every day wear comfortable, cotton socks wear comfortable, well-fitting shoes Follow up with Nutrition Diabetes Management  on 04/07/23  @ 445 pm Follow up with primary care provider on 03/31/23 Follow plate method, be mindful of carbohydrate intake at each meal Follow RULE OF 15 for low blood sugar management:  How to treat low blood sugars (Blood sugar less than 70 mg/dl  Please follow the RULE OF 15 for the treatment of hypoglycemia treatment (When your blood sugars are less than 70 mg/ dl) STEP  1:  Take 15 grams of carbohydrates when your blood sugar is low, which includes One tube of glucose gel STEP 2:  Recheck blood sugar in 15 minutes STEP 3:  3-4 glucose tabs or  3-4 oz of juice or regular soda or If your blood sugar is still low at the 15 minute recheck ---then, go back to STEP 1 and treat again with another 15 grams of carbohydrates  Provided my contact information for patient to call me or Raynelle Fanning if needed.  Follow Up Plan:  Telephone follow up appointment with care management team member scheduled for:  04/13/23 @ 0930 am            Plan: Telephone follow up appointment with care management team member scheduled for: 04/13/2023   Lonia Chimera, RN, BSN, CEN Population [facility]- Transition of Care Team.  [facility]  [phone]   

## 2023-04-06 NOTE — Telephone Encounter (Signed)
Unable to lm on vm due to mailbox not being set up. 2nd attempt.

## 2023-04-06 NOTE — Patient Instructions (Signed)
Visit Information  Thank you for taking time to visit with me today. Please don't hesitate to contact me if I can be of assistance to you before our next scheduled telephone appointment.  Following are the goals we discussed today:   Goals Addressed             This Visit's Progress    Transition of Care/ pt will have no readmissions within 30 days       Current Barriers:  Knowledge Deficits related to plan of care for management of DMII 04/06/2023  Patient reports that he is doing well managing his CBG at home. Reports that he is in contact with MD to adjust his medications.  Patient reports he monitors CBG with Freestyle Libre and also checks with glucometer as needed, pt reports he has had several readings of 69 that drops at bedtime, pt states he eats a snack and CBG elevates quickly, pt reports he talked with primary care provider office and has appointment for 03/24/23 and will discuss further, will also see dietician on 04/07/23.Marland Kitchen   04/06/2023- reports that he has done finger sticks and used his phone and readings are accurate.   He continues to monitor and is now eating a bedtime snack to help with low readings during the night.  Update 03/30/23- pt saw primary care provider on 03/24/23 and medication changes made and changes made after that date also, pt states he is on Novolog 20 units BID,  FBS 81 today and yesterday, RBS ranges 137-240, pt states no hypoglycemia with 20 units Novolog and will contact provider if any further low readings, pt continues on Metformin and Mounjaro.  Pt is scheduled to have eye exam 03/31/23.  04/06/2023  Eye exam completed.   RNCM Clinical Goal(s):  Patient will work with the Care Management team over the next 30 days to address Transition of Care Barriers: Diet/Nutrition/Food Resources verbalize understanding of plan for management of DMII as evidenced by patient report, review of EMR and  through collaboration with RN Care manager, provider, and care team.    Interventions: Evaluation of current treatment plan related to  self management and patient's adherence to plan as established by provider   Diabetes Interventions:  (Status:  Goal on track:  Yes.) Short Term Goal Assessed patient's understanding of A1c goal: <7% Reviewed medications with patient and discussed importance of medication adherence Counseled on importance of regular laboratory monitoring as prescribed Discussed plans with patient for ongoing care management follow up and provided patient with direct contact information for care management team Review of patient status, including review of consultants reports, relevant laboratory and other test results, and medications completed Reinforced signs/ symptoms of hypoglycemia and treatment Reinforced fasting blood sugar goals of 80-130 and less than 180 1 1/2-2 hours after meals Reinforced carbohydrate modified diet/ plate method with patient Reviewed CBG log with patient Reviewed upcoming scheduled appointments Reviewed importance of bedtime snack to include protein to stabilize blood sugar at night.  Encouraged patient to see the dietitian for assistance with meals and snacks.  Lab Results  Component Value Date   HGBA1C 13.7 (H) 03/15/2023    Patient Goals/Self-Care Activities: Participate in Transition of Care Program/Attend [facility] scheduled calls Notify RN Care Manager of [facility] call rescheduling needs Take all medications as prescribed Attend all scheduled provider appointments Call pharmacy for medication refills 3-7 days in advance of running out of medications Perform all self care activities independently  Call provider office for new concerns or questions  check blood sugar at prescribed times: per [facility] check feet daily for cuts, sores or redness enter blood sugar readings and medication or insulin into daily log take the blood sugar log to all doctor visits trim toenails straight across fill half of plate  with vegetables manage portion size prepare main meal at home 3 to 5 days each week wash and dry feet carefully every day wear comfortable, cotton socks wear comfortable, well-fitting shoes Follow up with Nutrition Diabetes Management on 04/07/23  @ 445 pm Follow up with primary care provider on 03/31/23 Follow plate method, be mindful of carbohydrate intake at each meal Follow RULE OF 15 for low blood sugar management:  How to treat low blood sugars (Blood sugar less than 70 mg/dl  Please follow the RULE OF 15 for the treatment of hypoglycemia treatment (When your blood sugars are less than 70 mg/ dl) STEP  1:  Take 15 grams of carbohydrates when your blood sugar is low, which includes One tube of glucose gel STEP 2:  Recheck blood sugar in 15 minutes STEP 3:  3-4 glucose tabs or  3-4 oz of juice or regular soda or If your blood sugar is still low at the 15 minute recheck ---then, go back to STEP 1 and treat again with another 15 grams of carbohydrates  Provided my contact information for patient to call me or Raynelle Fanning if needed.  Follow Up Plan:  Telephone follow up appointment with care management team member scheduled for:  04/13/23 @ 0930 am           Our next appointment is by telephone on 04/13/2023 at 0930   Please call the care guide team at [phone] if you need to cancel or reschedule your appointment.   If you are experiencing a Mental Health or Behavioral Health Crisis or need someone to talk to, please call 911   Patient verbalizes understanding of instructions and care plan provided today and agrees to view in MyChart. Active MyChart status and patient understanding of how to access instructions and care plan via MyChart confirmed with patient.     Lonia Chimera, RN, BSN, CEN [employer]- Transition of Care Team.  [facility] [phone]

## 2023-04-07 ENCOUNTER — Encounter: Payer: Medicare HMO | Attending: Family Medicine | Admitting: Dietician

## 2023-04-07 DIAGNOSIS — E1110 Type 2 diabetes mellitus with ketoacidosis without coma: Secondary | ICD-10-CM | POA: Insufficient documentation

## 2023-04-07 DIAGNOSIS — E1169 Type 2 diabetes mellitus with other specified complication: Secondary | ICD-10-CM | POA: Diagnosis not present

## 2023-04-07 DIAGNOSIS — E756 Lipid storage disorder, unspecified: Secondary | ICD-10-CM | POA: Insufficient documentation

## 2023-04-07 DIAGNOSIS — Z7984 Long term (current) use of oral hypoglycemic drugs: Secondary | ICD-10-CM | POA: Insufficient documentation

## 2023-04-07 DIAGNOSIS — Z794 Long term (current) use of insulin: Secondary | ICD-10-CM | POA: Insufficient documentation

## 2023-04-07 DIAGNOSIS — R000 Tachycardia, unspecified: Secondary | ICD-10-CM | POA: Diagnosis not present

## 2023-04-07 DIAGNOSIS — R3589 Other polyuria: Secondary | ICD-10-CM | POA: Insufficient documentation

## 2023-04-07 NOTE — Patient Instructions (Signed)
Aim for a consistent carbohydrate intake at meals and snack time.

## 2023-04-07 NOTE — Progress Notes (Signed)
Diabetes Self-Management Education  Visit Type: First/Initial  Appt. Start Time: 1645 Appt. End Time: 1700  04/07/2023  [name], identified by name and date of birth, is a 68 y.o. male with a diagnosis of Diabetes: Type 2.   ASSESSMENT  Patient is here today with his wife, Clydie Braun. Patient would like to learn more about what to eat for blood sugar control. Patient lives with wife.  Pt reports shared shopping and Pt does the majority of the cooking. Pt reports he has omitted soda, reduced snacks, smaller meals. Pt is retired. Pt report weather barriers to PA participation. Pt's libre 3 app contains multiple low blood sugar with value ranging "68-69" mg/dL. Pt states hypoglycemia occurs at night. RD encouraged a bedtime snack in an effort to prevent hypoglycemia. All Pt's questions were answered during this encounter.    History includes:   Past Medical History:  Diagnosis Date   Anxiety    generalized anxiety disorder   Arthritis    BACK   Diabetes (HCC)    Fatty liver    GAD (generalized anxiety disorder)    GERD (gastroesophageal reflux disease)    Hyperlipidemia    Hypertension    IBS (irritable bowel syndrome)    Low testosterone    Palpitations    Palpitations    Vitamin D deficiency     Labs noted:   Lab Results  Component Value Date   HGBA1C 13.7 (H) 03/15/2023   Lab Results  Component Value Date   CHOL 226 (H) 11/09/2022   HDL 45 11/09/2022   LDLCALC 160 (H) 11/09/2022   TRIG 116 11/09/2022   CHOLHDL 5.0 11/09/2022   BP Readings from Last 3 Encounters:  03/31/23 111/67  03/24/23 110/67  03/20/23 (!) 161/85   Last vitamin D Lab Results  Component Value Date   VD25OH 16.1 (L) 05/11/2022   Medications include:   Current Outpatient Medications:    Blood Glucose Monitoring Suppl (ONETOUCH VERIO FLEX SYSTEM) w/Device KIT, 1 each by Does not apply route in the morning, at noon, and at bedtime., Disp: 1 kit, Rfl: 0   Blood Glucose Monitoring Suppl  DEVI, 1 each by Does not apply route in the morning, at noon, and at bedtime. May substitute to any manufacturer covered by patient's insurance., Disp: 1 each, Rfl: 0   Continuous Glucose Sensor (FREESTYLE LIBRE 3 SENSOR) MISC, Place 1 sensor on the skin every 14 days. Use to check glucose continuously, Disp: 2 each, Rfl: 0   doxepin (SINEQUAN) 25 MG capsule, TAKE 1 CAPSULE BY MOUTH EVERYDAY AT BEDTIME, Disp: 90 capsule, Rfl: 3   lisinopril (ZESTRIL) 10 MG tablet, Take 10 mg by mouth daily., Disp: , Rfl:    metFORMIN (GLUCOPHAGE-XR) 500 MG 24 hr tablet, Take 1 tablet (500 mg total) by mouth daily with breakfast., Disp: , Rfl:    rosuvastatin (CRESTOR) 10 MG tablet, Take 1 tablet (10 mg total) by mouth daily., Disp: 90 tablet, Rfl: 3   tirzepatide (MOUNJARO) 2.5 MG/0.5ML Pen, Inject 2.5 mg into the skin once a week., Disp: 2 mL, Rfl: 0   Vitamin D, Ergocalciferol, (DRISDOL) 1.25 MG (50000 UNIT) CAPS capsule, Take 1 capsule (50,000 Units total) by mouth every 7 (seven) days. (Patient taking differently: Take 50,000 Units by mouth every 7 (seven) days. Saturday, last dose was on 12-28-20204.), Disp: 13 capsule, Rfl: 3   Glucose Blood (BLOOD GLUCOSE TEST STRIPS) STRP, 1 each by In Vitro route in the morning, at noon, and at bedtime.  May substitute to any manufacturer covered by patient's insurance., Disp: 100 strip, Rfl: 11   Glucose Blood (BLOOD GLUCOSE TEST STRIPS) STRP, Use 1 test strip daily in the morning, at noon, and at bedtime., Disp: 100 each, Rfl: 0   Glucose Blood (BLOOD GLUCOSE TEST STRIPS) STRP, 1 each by Does not apply route 3 (three) times daily. Use as directed to check blood sugar. May dispense any manufacturer covered by patient's insurance and fits patient's device., Disp: 100 strip, Rfl: 0   insulin glargine (LANTUS SOLOSTAR) 100 UNIT/ML Solostar Pen, Inject 30 Units into the skin every morning., Disp: 15 mL, Rfl: PRN   Insulin Pen Needle 32G X 4 MM MISC, Use to check blood sugars 2 (two)  times daily., Disp: 100 each, Rfl: 0   Lancet Device MISC, 1 each by Does not apply route in the morning, at noon, and at bedtime. May substitute to any manufacturer covered by patient's insurance., Disp: 1 each, Rfl: 0   Lancet Devices (ONE TOUCH DELICA LANCING DEV) MISC, Use in the morning, at noon, and at bedtime., Disp: 1 each, Rfl: 0   Lancets Misc. MISC, 1 each by Does not apply route in the morning, at noon, and at bedtime. May substitute to any manufacturer covered by patient's insurance., Disp: 100 each, Rfl: 0   Lancets MISC, 1 each by Does not apply route 3 (three) times daily. Use as directed to check blood sugar. May dispense any manufacturer covered by patient's insurance and fits patient's device., Disp: 100 each, Rfl: 0   CGM Results from download: Libre 3  % Time CGM active:   54%   (Goal >70%)  Average glucose:   100 mg/dL for 5 days  Time in range (70-180 mg/dL):   034 %   (Goal >74%)  Time High (181-250 mg/dL):   0 %   (Goal < 25%)  Time Very High (>250 mg/dL):    0 %   (Goal < 5%)  Time Low (54-69 mg/dL):   0 %   (Goal <9%)  Time Very Low (<54 mg/dL):   0 %   (Goal <5%)    There were no vitals taken for this visit. There is no height or weight on file to calculate BMI.   Diabetes Self-Management Education - 04/07/23 1639       Visit Information   Visit Type First/Initial      Initial Visit   Diabetes Type Type 2    Date Diagnosed 2024    Are you currently following a meal plan? No    Are you taking your medications as prescribed? Yes      Health Coping   How would you rate your overall health? Fair      Psychosocial Assessment   Patient Belief/Attitude about Diabetes Motivated to manage diabetes    What is the hardest part about your diabetes right now, causing you the most concern, or is the most worrisome to you about your diabetes?   Making healty food and beverage choices;Getting support / problem solving    Self-care barriers None    Self-management  support Doctor's office    Other persons present Patient;Spouse/SO    Patient Concerns Nutrition/Meal planning;Support    Special Needs None    Preferred Learning Style No preference indicated    Learning Readiness Change in progress    How often do you need to have someone help you when you read instructions, pamphlets, or other written materials from your doctor or  pharmacy? 3 - Sometimes    What is the last grade level you completed in school? 12th      Pre-Education Assessment   Patient understands the diabetes disease and treatment process. Needs Instruction    Patient understands incorporating nutritional management into lifestyle. Needs Instruction    Patient undertands incorporating physical activity into lifestyle. Needs Instruction    Patient understands using medications safely. Needs Instruction    Patient understands monitoring blood glucose, interpreting and using results Needs Instruction    Patient understands prevention, detection, and treatment of acute complications. Needs Instruction    Patient understands prevention, detection, and treatment of chronic complications. Needs Instruction    Patient understands how to develop strategies to address psychosocial issues. Needs Instruction    Patient understands how to develop strategies to promote health/change behavior. Needs Instruction      Complications   Last HgB A1C per patient/outside source 13.7 %    How often do you check your blood sugar? > 4 times/day   libre 3   Fasting Blood glucose range (mg/dL) 191-478;29-562    Postprandial Blood glucose range (mg/dL) 13-086;578-469    Number of hypoglycemic episodes per month 14    Have you had a dilated eye exam in the past 12 months? Yes    Have you had a dental exam in the past 12 months? Yes    Are you checking your feet? Yes    How many days per week are you checking your feet? 7      Dietary Intake   Breakfast 1 bacon, 1 egg, 1 coffee with whole milk ~ 1 tablespoon     Lunch half can of beanie weinnie, 2 crackers, water    Dinner hamburger steak, onions, carrots, potatoes, water    Snack (evening) yogurt yopliat    Beverage(s) water, coffe      Activity / Exercise   Activity / Exercise Type ADL's      Patient Education   Previous Diabetes Education No    Disease Pathophysiology Definition of diabetes, type 1 and 2, and the diagnosis of diabetes    Healthy Eating Plate Method;Carbohydrate counting;Reviewed blood glucose goals for pre and post meals and how to evaluate the patients' food intake on their blood glucose level.;Food label reading, portion sizes and measuring food.    Being Active Role of exercise on diabetes management, blood pressure control and cardiac health.    Medications Reviewed patients medication for diabetes, action, purpose, timing of dose and side effects.    Monitoring Taught/evaluated CGM (comment);Identified appropriate SMBG and/or A1C goals.    Acute complications Taught prevention, symptoms, and  treatment of hypoglycemia - the 15 rule.    Chronic complications Dental care;Relationship between chronic complications and blood glucose control;Identified and discussed with patient  current chronic complications;Retinopathy and reason for yearly dilated eye exams    Diabetes Stress and Support Role of stress on diabetes;Worked with patient to identify barriers to care and solutions;Identified and addressed patients feelings and concerns about diabetes    Lifestyle and Health Coping Lifestyle issues that need to be addressed for better diabetes care      Individualized Goals (developed by patient)   Nutrition Carb counting;General guidelines for healthy choices and portions discussed    Medications take my medication as prescribed    Monitoring  Consistenly use CGM    Problem Solving Eating Pattern    Reducing Risk do foot checks daily;treat hypoglycemia with 15 grams of carbs if blood glucose  less than 70mg /dL    Health Coping  Ask for help with psychological, social, or emotional issues      Post-Education Assessment   Patient understands the diabetes disease and treatment process. Needs Review    Patient understands incorporating nutritional management into lifestyle. Needs Review    Patient undertands incorporating physical activity into lifestyle. Needs Review    Patient understands using medications safely. Comphrehends key points    Patient understands monitoring blood glucose, interpreting and using results Comprehends key points    Patient understands prevention, detection, and treatment of acute complications. Comprehends key points    Patient understands prevention, detection, and treatment of chronic complications. Needs Review    Patient understands how to develop strategies to address psychosocial issues. Needs Review    Patient understands how to develop strategies to promote health/change behavior. Comprehends key points      Outcomes   Expected Outcomes Demonstrated interest in learning. Expect positive outcomes    Future DMSE 2 months    Program Status Not Completed             Individualized Plan for Diabetes Self-Management Training:   Learning Objective:  Patient will have a greater understanding of diabetes self-management. Patient education plan is to attend individual and/or group sessions per assessed needs and concerns.   Plan:   Patient Instructions   Aim for a consistent carbohydrate intake at meals and snack time.   Expected Outcomes:  Demonstrated interest in learning. Expect positive outcomes  Education material provided: ADA - How to Thrive: A Guide for Your Journey with Diabetes, My Plate, Snack sheet, and Diabetes Resources  If problems or questions, patient to contact team via:  Phone  Future DSME appointment: 2 months

## 2023-04-08 NOTE — Telephone Encounter (Signed)
Spoke to pt and he states that he is taking insulin and the back pain has subsided.

## 2023-04-09 ENCOUNTER — Ambulatory Visit (INDEPENDENT_AMBULATORY_CARE_PROVIDER_SITE_OTHER): Payer: Medicare HMO | Admitting: Family Medicine

## 2023-04-09 ENCOUNTER — Encounter: Payer: Self-pay | Admitting: Family Medicine

## 2023-04-09 VITALS — BP 125/74 | HR 95 | Temp 98.0°F | Ht 62.0 in | Wt 182.0 lb

## 2023-04-09 DIAGNOSIS — E1110 Type 2 diabetes mellitus with ketoacidosis without coma: Secondary | ICD-10-CM | POA: Diagnosis not present

## 2023-04-09 DIAGNOSIS — R3589 Other polyuria: Secondary | ICD-10-CM | POA: Diagnosis not present

## 2023-04-09 DIAGNOSIS — E11649 Type 2 diabetes mellitus with hypoglycemia without coma: Secondary | ICD-10-CM | POA: Diagnosis not present

## 2023-04-09 DIAGNOSIS — Z7984 Long term (current) use of oral hypoglycemic drugs: Secondary | ICD-10-CM | POA: Diagnosis not present

## 2023-04-09 LAB — URINALYSIS
Bilirubin, UA: NEGATIVE
Glucose, UA: NEGATIVE
Ketones, UA: NEGATIVE
Leukocytes,UA: NEGATIVE
Nitrite, UA: NEGATIVE
Protein,UA: NEGATIVE
RBC, UA: NEGATIVE
Specific Gravity, UA: 1.02 (ref 1.005–1.030)
Urobilinogen, Ur: 0.2 mg/dL (ref 0.2–1.0)
pH, UA: 6 (ref 5.0–7.5)

## 2023-04-09 MED ORDER — FREESTYLE LIBRE 3 SENSOR MISC: 11 refills | Status: DC

## 2023-04-09 MED ORDER — TIRZEPATIDE 5 MG/0.5ML ~~LOC~~ SOAJ: 5.0000 mg | SUBCUTANEOUS | 2 refills | Status: DC

## 2023-04-09 MED ORDER — LANTUS SOLOSTAR 100 UNIT/ML ~~LOC~~ SOPN: 30.0000 [IU] | PEN_INJECTOR | SUBCUTANEOUS | 99 refills | Status: DC

## 2023-04-09 MED ORDER — ONETOUCH VERIO VI STRP: ORAL_STRIP | 12 refills | Status: DC

## 2023-04-09 MED ORDER — LANTUS SOLOSTAR 100 UNIT/ML ~~LOC~~ SOPN: 20.0000 [IU] | PEN_INJECTOR | SUBCUTANEOUS | 99 refills | Status: DC

## 2023-04-09 NOTE — Progress Notes (Signed)
Subjective:  Patient ID: Tyler Iha., male    DOB: 1955/10/08  Age: 68 y.o. MRN: [id number]  CC: Blood Sugar Problem (Evening and nights BS is still dropping low. Pt reports 80 to 115.)   HPI Tyler Pruitt. presents for recheck of his blood glucose. He has been checking frequently still having intermittent lows, especially at night. His Libre alarms at 33 and he has to eat or drink carbs although he doesn't feel anything until at 60 and below he gets shaky. He is concerned that his fingerstick lancet device is a delica, but the glucometer is not. They work well together though.  He would like to wean off of the Mounjaro to just metformin. First needs to get off of lantus     04/09/2023   11:49 AM 04/07/2023    4:38 PM 03/24/2023    2:41 PM  Depression screen PHQ 2/9  Decreased Interest 0 0 0  Down, Depressed, Hopeless 0 0 0  PHQ - 2 Score 0 0 0  Altered sleeping 0  0  Tired, decreased energy 0  0  Change in appetite 0  0  Feeling bad or failure about yourself  0  0  Trouble concentrating 0  0  Moving slowly or fidgety/restless 0  0  Suicidal thoughts 0  0  PHQ-9 Score 0  0  Difficult doing work/chores Not difficult at all      History Tyler Pruitt has a past medical history of Anxiety, Arthritis, Diabetes (HCC), Fatty liver, GAD (generalized anxiety disorder), GERD (gastroesophageal reflux disease), Hyperlipidemia, Hypertension, IBS (irritable bowel syndrome), Low testosterone, Palpitations, Palpitations, and Vitamin D deficiency.   He has a past surgical history that includes None; Colonoscopy; Polypectomy; and Carpal tunnel release (Right).   His family history includes Heart disease (age of onset: 23) in his father; Rheum arthritis in his mother.He reports that he quit smoking about 31 years ago. His smoking use included cigarettes. He has never been exposed to tobacco smoke. He has never used smokeless tobacco. He reports current alcohol use of about 2.0 - 3.0 standard drinks  of alcohol per week. He reports that he does not use drugs.    ROS Review of Systems  Constitutional:  Negative for fever.  Respiratory:  Negative for shortness of breath.   Cardiovascular:  Negative for chest pain.  Musculoskeletal:  Negative for arthralgias.  Skin:  Negative for rash.    Objective:  BP 125/74   Pulse 95   Temp 98 F (36.7 C)   Ht 5\' 2"  (1.575 m)   Wt 182 lb (82.6 kg)   SpO2 99%   BMI 33.29 kg/m   BP Readings from Last 3 Encounters:  04/09/23 125/74  03/31/23 111/67  03/24/23 110/67    Wt Readings from Last 3 Encounters:  04/09/23 182 lb (82.6 kg)  03/31/23 184 lb (83.5 kg)  03/24/23 188 lb (85.3 kg)     Physical Exam Vitals reviewed.  Constitutional:      Appearance: He is well-developed.  HENT:     Head: Normocephalic and atraumatic.     Right Ear: External ear normal.     Left Ear: External ear normal.     Mouth/Throat:     Pharynx: No oropharyngeal exudate or posterior oropharyngeal erythema.  Eyes:     Pupils: Pupils are equal, round, and reactive to light.  Cardiovascular:     Rate and Rhythm: Normal rate and regular rhythm.     Heart  sounds: No murmur heard. Pulmonary:     Effort: No respiratory distress.     Breath sounds: Normal breath sounds.  Musculoskeletal:     Cervical back: Normal range of motion and neck supple.  Neurological:     Mental Status: He is alert and oriented to person, place, and time.       Assessment & Plan:   Khaleb was seen today for blood sugar problem.  Diagnoses and all orders for this visit:  Diabetic hypoglycemia (HCC) -     Urine Culture -     Urinalysis  Other orders -     Continuous Glucose Sensor (FREESTYLE LIBRE 3 SENSOR) MISC; Place 1 sensor on the skin every 14 days. Use to check glucose continuously -     glucose blood (ONETOUCH VERIO) test strip; Use as instructed -     Discontinue: insulin glargine (LANTUS SOLOSTAR) 100 UNIT/ML Solostar Pen; Inject 30 Units into the skin every  morning. -     tirzepatide (MOUNJARO) 5 MG/0.5ML Pen; Inject 5 mg into the skin once a week. -     insulin glargine (LANTUS SOLOSTAR) 100 UNIT/ML Solostar Pen; Inject 20 Units into the skin every morning.       I have discontinued Tyler Mow Jr.'s Lancets Misc., Lancets, tirzepatide, and Lantus SoloStar. I have also changed his Lantus SoloStar. Additionally, I am having him start on OneTouch Verio and tirzepatide. Lastly, I am having him maintain his Vitamin D (Ergocalciferol), Blood Glucose Monitoring Suppl, Lancet Device, OneTouch Verio Flex System, ONE TOUCH DELICA LANCING DEV, BLOOD GLUCOSE TEST STRIPS, Insulin Pen Needle, doxepin, rosuvastatin, metFORMIN, lisinopril, and FreeStyle Libre 3 Sensor.  Allergies as of 04/09/2023       Reactions   Pravastatin    Hurting "badly"        Medication List        Accurate as of April 09, 2023  8:19 PM. If you have any questions, ask your nurse or doctor.          STOP taking these medications    Lancets Misc Stopped by: Kerrilyn Azbill   [employer] Misc Stopped by: Celestine Bougie   tirzepatide 2.5 MG/0.5ML Pen Commonly known as: MOUNJARO Replaced by: tirzepatide 5 MG/0.5ML Pen Stopped by: Mikahla Wisor       TAKE these medications    BD Pen Needle Nano U/F 32G X 4 MM Misc Generic drug: Insulin Pen Needle Use to check blood sugars 2 (two) times daily.   Blood Glucose Monitoring Suppl Devi 1 each by Does not apply route in the morning, at noon, and at bedtime. May substitute to any manufacturer covered by patient's insurance.   OneTouch Verio Flex System w/Device Kit 1 each by Does not apply route in the morning, at noon, and at bedtime.   BLOOD GLUCOSE TEST STRIPS Strp 1 each by Does not apply route 3 (three) times daily. Use as directed to check blood sugar. May dispense any manufacturer covered by patient's insurance and fits patient's device. What changed:  Another medication with the same name was  changed. Make sure you understand how and when to take each. Another medication with the same name was removed. Continue taking this medication, and follow the directions you see here. Changed by: Faigy Stretch   [employer] test strip Generic drug: glucose blood Use as instructed What changed:  how much to take how to take this when to take this additional instructions Another medication with the same name was  removed. Continue taking this medication, and follow the directions you see here. Changed by: Senia Even   doxepin 25 MG capsule Commonly known as: SINEQUAN TAKE 1 CAPSULE BY MOUTH EVERYDAY AT BEDTIME   FreeStyle Libre 3 Sensor Misc Place 1 sensor on the skin every 14 days. Use to check glucose continuously   Lancet Device Misc 1 each by Does not apply route in the morning, at noon, and at bedtime. May substitute to any manufacturer covered by patient's insurance.   ONE TOUCH DELICA LANCING DEV Misc Use in the morning, at noon, and at bedtime.   Lantus SoloStar 100 UNIT/ML Solostar Pen Generic drug: insulin glargine Inject 20 Units into the skin every morning. What changed: how much to take Changed by: Siena Poehler   lisinopril 10 MG tablet Commonly known as: ZESTRIL Take 10 mg by mouth daily.   metFORMIN 500 MG 24 hr tablet Commonly known as: GLUCOPHAGE-XR Take 1 tablet (500 mg total) by mouth daily with breakfast.   rosuvastatin 10 MG tablet Commonly known as: Crestor Take 1 tablet (10 mg total) by mouth daily.   tirzepatide 5 MG/0.5ML Pen Commonly known as: MOUNJARO Inject 5 mg into the skin once a week. Replaces: tirzepatide 2.5 MG/0.5ML Pen Started by: Kiyan Burmester   Vitamin D (Ergocalciferol) 1.25 MG (50000 UNIT) Caps capsule Commonly known as: DRISDOL Take 1 capsule (50,000 Units total) by mouth every 7 (seven) days. What changed: additional instructions       36 minutes spent with pt. More than 1/2 in discussion of diabetes ytreatment  and is in the coverage of the to give a reason you should take some time off just let it go away  Follow-up: Return in about 3 weeks (around 04/30/2023).  Mechele Claude, M.D.

## 2023-04-11 LAB — URINE CULTURE

## 2023-04-12 ENCOUNTER — Encounter: Payer: Self-pay | Admitting: Family Medicine

## 2023-04-12 NOTE — Telephone Encounter (Signed)
Spoke with pt and made him aware of results per pcp's notes. Pt voiced understanding.

## 2023-04-13 ENCOUNTER — Other Ambulatory Visit: Payer: Self-pay | Admitting: *Deleted

## 2023-04-13 ENCOUNTER — Encounter: Payer: Self-pay | Admitting: *Deleted

## 2023-04-13 NOTE — Patient Outreach (Signed)
Care Management  Transitions of Care Program Transitions of Care Post-discharge week 5   04/13/2023 Name: Eh Sesay. MRN: [id number] DOB: [birth year]  Subjective: Tyler Pruitt. is a 68 y.o. year old male who is a primary care patient of Stacks, Broadus Jag, MD. The Care Management team Engaged with patient Engaged with patient by telephone to assess and address transitions of care needs.   Consent to Services:  Patient was given information about care management services, agreed to services, and gave verbal consent to participate.   Assessment: Patient reports he is doing well, had a low reading of 69 on 04/11/23, ate a snack and CBG quickly elevated, pt saw dietician and this was helpful, pt is consuming 2-3 carbs per meal, being mindful to consume adequate protein at each meal.         SDOH Interventions    Flowsheet Row Nutrition from 04/07/2023 in [facility] Health Nutr Diab Ed  - A Dept Of [place]. [facility] Telephone from 03/22/2023 in [place] POPULATION HEALTH DEPARTMENT Office Visit from 07/30/2020 in [facility]  SDOH Interventions     Food Insecurity Interventions Intervention Not Indicated Intervention Not Indicated --  Housing Interventions -- Intervention Not Indicated --  Transportation Interventions -- Intervention Not Indicated --  Utilities Interventions -- Intervention Not Indicated --  Depression Interventions/Treatment  -- -- LKG4-0 Score <4 Follow-up Not Indicated        Goals Addressed             This Visit's Progress    Transition of Care/ pt will have no readmissions within 30 days       Current Barriers:  Knowledge Deficits related to plan of care for management of DMII  Patient reports that he is doing well managing his CBG at home. Reports that he is in contact with MD to adjust his medications.  Patient reports he monitors CBG with Freestyle Libre and also checks with glucometer as needed, pt reports  he has had several readings of 69 that drops at bedtime, pt states he eats a snack and CBG elevates quickly, pt reports he talked with primary care provider office and has appointment for 03/24/23 and will discuss further, will also see dietician on 04/07/23, reports that he has done finger sticks and used his phone and readings are accurate.   He continues to monitor and is now eating a bedtime snack to help with low readings during the night.  Update-  FBS ranges 80-89, FBS ranges in afternoon 105,106 & 128 and later in the day 83,88. pt states 2 days ago had alarm for CBG of 69, pt states will contact provider if any further low readings, pt continues on Metformin and Mounjaro.  Pt had eye exam 03/31/23.   RNCM Clinical Goal(s):  Patient will work with the Care Management team over the next 30 days to address Transition of Care Barriers: Diet/Nutrition/Food Resources verbalize understanding of plan for management of DMII as evidenced by patient report, review of EMR and  through collaboration with RN Care manager, provider, and care team.   Interventions: Evaluation of current treatment plan related to  self management and patient's adherence to plan as established by provider   Diabetes Interventions:  (Status:  Goal on track:  Yes.) Short Term Goal Assessed patient's understanding of A1c goal: <7% Reviewed medications with patient and discussed importance of medication adherence Counseled on importance of regular laboratory monitoring as prescribed Discussed plans  with patient for ongoing care management follow up and provided patient with direct contact information for care management team Review of patient status, including review of consultants reports, relevant laboratory and other test results, and medications completed Reinforced signs/ symptoms of hypoglycemia and treatment Reviewed fasting blood sugar goals of 80-130 and less than 180 1 1/2-2 hours after meals Reviewed carbohydrate  modified diet/ plate method with patient Reviewed CBG log with patient Reviewed upcoming scheduled appointments Reinforced importance of bedtime snack to include protein to stabilize blood sugar at night.  Encouraged patient to see the dietitian for assistance with meals and snacks.  Lab Results  Component Value Date   HGBA1C 13.7 (H) 03/15/2023    Patient Goals/Self-Care Activities: Participate in Transition of Care Program/Attend [facility] scheduled calls Notify RN Care Manager of TOC call rescheduling needs Take all medications as prescribed Attend all scheduled provider appointments Call pharmacy for medication refills 3-7 days in advance of running out of medications Perform all self care activities independently  Call provider office for new concerns or questions  check blood sugar at prescribed times: per [facility] check feet daily for cuts, sores or redness enter blood sugar readings and medication or insulin into daily log take the blood sugar log to all doctor visits trim toenails straight across fill half of plate with vegetables manage portion size prepare main meal at home 3 to 5 days each week wash and dry feet carefully every day wear comfortable, cotton socks wear comfortable, well-fitting shoes Follow up with Nutrition Diabetes Management as needed Follow up with primary care provider as needed Follow plate method, be mindful of carbohydrate intake at each meal Follow RULE OF 15 for low blood sugar management:  How to treat low blood sugars (Blood sugar less than 70 mg/dl  Please follow the RULE OF 15 for the treatment of hypoglycemia treatment (When your blood sugars are less than 70 mg/ dl) STEP  1:  Take 15 grams of carbohydrates when your blood sugar is low, which includes One tube of glucose gel STEP 2:  Recheck blood sugar in 15 minutes STEP 3:  3-4 glucose tabs or  3-4 oz of juice or regular soda or If your blood sugar is still low at the 15 minute  recheck ---then, go back to STEP 1 and treat again with another 15 grams of carbohydrates  Provided my contact information for patient to call me or Raynelle Fanning if needed.  Follow Up Plan:  Telephone follow up appointment with care management team member scheduled for:  04/20/23 @ 10 am          Plan: Telephone follow up appointment with care management team member scheduled for: 04/20/23 @ 10 am  Irving Shows [facility], BSN RN Care Manager/ Transition of Care [place]/ [facility] Population Health [phone]

## 2023-04-13 NOTE — Patient Instructions (Signed)
Visit Information  Thank you for taking time to visit with me today. Please don't hesitate to contact me if I can be of assistance to you before our next scheduled telephone appointment.  Following are the goals we discussed today:   Goals Addressed             This Visit's Progress    Transition of Care/ pt will have no readmissions within 30 days       Current Barriers:  Knowledge Deficits related to plan of care for management of DMII  Patient reports that he is doing well managing his CBG at home. Reports that he is in contact with MD to adjust his medications.  Patient reports he monitors CBG with Freestyle Libre and also checks with glucometer as needed, pt reports he has had several readings of 69 that drops at bedtime, pt states he eats a snack and CBG elevates quickly, pt reports he talked with primary care provider office and has appointment for 03/24/23 and will discuss further, will also see dietician on 04/07/23, reports that he has done finger sticks and used his phone and readings are accurate.   He continues to monitor and is now eating a bedtime snack to help with low readings during the night.  Update-  FBS ranges 80-89, FBS ranges in afternoon 105,106 & 128 and later in the day 83,88. pt states 2 days ago had alarm for CBG of 69, pt states will contact provider if any further low readings, pt continues on Metformin and Mounjaro.  Pt had eye exam 03/31/23.   RNCM Clinical Goal(s):  Patient will work with the Care Management team over the next 30 days to address Transition of Care Barriers: Diet/Nutrition/Food Resources verbalize understanding of plan for management of DMII as evidenced by patient report, review of EMR and  through collaboration with RN Care manager, provider, and care team.   Interventions: Evaluation of current treatment plan related to  self management and patient's adherence to plan as established by provider   Diabetes Interventions:  (Status:  Goal on  track:  Yes.) Short Term Goal Assessed patient's understanding of A1c goal: <7% Reviewed medications with patient and discussed importance of medication adherence Counseled on importance of regular laboratory monitoring as prescribed Discussed plans with patient for ongoing care management follow up and provided patient with direct contact information for care management team Review of patient status, including review of consultants reports, relevant laboratory and other test results, and medications completed Reinforced signs/ symptoms of hypoglycemia and treatment Reviewed fasting blood sugar goals of 80-130 and less than 180 1 1/2-2 hours after meals Reviewed carbohydrate modified diet/ plate method with patient Reviewed CBG log with patient Reviewed upcoming scheduled appointments Reinforced importance of bedtime snack to include protein to stabilize blood sugar at night.  Encouraged patient to see the dietitian for assistance with meals and snacks.  Lab Results  Component Value Date   HGBA1C 13.7 (H) 03/15/2023    Patient Goals/Self-Care Activities: Participate in Transition of Care Program/Attend [facility] scheduled calls Notify RN Care Manager of TOC call rescheduling needs Take all medications as prescribed Attend all scheduled provider appointments Call pharmacy for medication refills 3-7 days in advance of running out of medications Perform all self care activities independently  Call provider office for new concerns or questions  check blood sugar at prescribed times: per [facility] check feet daily for cuts, sores or redness enter blood sugar readings and medication or insulin into daily log  take the blood sugar log to all doctor visits trim toenails straight across fill half of plate with vegetables manage portion size prepare main meal at home 3 to 5 days each week wash and dry feet carefully every day wear comfortable, cotton socks wear comfortable, well-fitting  shoes Follow up with Nutrition Diabetes Management as needed Follow up with primary care provider as needed Follow plate method, be mindful of carbohydrate intake at each meal Follow RULE OF 15 for low blood sugar management:  How to treat low blood sugars (Blood sugar less than 70 mg/dl  Please follow the RULE OF 15 for the treatment of hypoglycemia treatment (When your blood sugars are less than 70 mg/ dl) STEP  1:  Take 15 grams of carbohydrates when your blood sugar is low, which includes One tube of glucose gel STEP 2:  Recheck blood sugar in 15 minutes STEP 3:  3-4 glucose tabs or  3-4 oz of juice or regular soda or If your blood sugar is still low at the 15 minute recheck ---then, go back to STEP 1 and treat again with another 15 grams of carbohydrates  Provided my contact information for patient to call me or Raynelle Fanning if needed.  Follow Up Plan:  Telephone follow up appointment with care management team member scheduled for:  04/20/23 @ 10 am           Our next appointment is by telephone on 04/20/23 at 10 am  Please call the care guide team at [phone] if you need to cancel or reschedule your appointment.   If you are experiencing a Mental Health or Behavioral Health Crisis or need someone to talk to, please call the Suicide and Crisis Lifeline: 988 call the [place] National Suicide Prevention Lifeline: 516-288-1312 or TTY: 815-101-1725 TTY 9010778153) to talk to a trained counselor call 1-800-273-TALK (toll free, 24 hour hotline) go to [facility] [address], [place] [phone]) call the [facility] Crisis Line: [phone] call 911   Patient verbalizes understanding of instructions and care plan provided today and agrees to view in MyChart. Active MyChart status and patient understanding of how to access instructions and care plan via MyChart confirmed with patient.     Irving Shows [facility], BSN RN Care Manager/ Transition of  Care [place]/ [facility] [phone]

## 2023-04-20 ENCOUNTER — Other Ambulatory Visit: Payer: Self-pay | Admitting: *Deleted

## 2023-04-20 NOTE — Patient Outreach (Signed)
 Care Management  Transitions of Care Program Transitions of Care Post-discharge week 5   04/20/2023 Name: Tyler Pruitt. MRN: [id number] DOB: [birth year]  Subjective: Tyler Pruitt. is a 68 y.o. year old male who is a primary care patient of Stacks, Butler, MD. The Care Management team Engaged with patient Engaged with patient by telephone to assess and address transitions of care needs.   Consent to Services:  Patient was given information about care management services, agreed to services, and gave verbal consent to participate.   Assessment: Patient feels CBG is managed much better, no hypoglycemic episodes over the past week, eating a snack at hs, Libre average for CBG is 98, pt will follow up with dietician as needed, pt does not want longitudinal care management services citing not necessary         SDOH Interventions    Flowsheet Row Nutrition from 04/07/2023 in [facility] Health Nutr Diab Ed  - A Dept Of [place]. [facility] Telephone from 03/22/2023 in [place] POPULATION HEALTH DEPARTMENT Office Visit from 07/30/2020 in [facility]  SDOH Interventions     Food Insecurity Interventions Intervention Not Indicated Intervention Not Indicated --  Housing Interventions -- Intervention Not Indicated --  Transportation Interventions -- Intervention Not Indicated --  Utilities Interventions -- Intervention Not Indicated --  Depression Interventions/Treatment  -- -- EYV7-0 Score <4 Follow-up Not Indicated        Goals Addressed             This Visit's Progress    COMPLETED: Transition of Care/ pt will have no readmissions within 30 days       Current Barriers:  Knowledge Deficits related to plan of care for management of DMII  Patient reports that he is doing well managing his CBG at home. Reports that he is in contact with MD to adjust his medications.  Patient reports he monitors CBG with Freestyle Libre and also checks with  glucometer as needed, pt reports he has had hardly any hypoglycemic episodes, none over the past week, pt states he is eating a snack and bedtime and this is helping, pt saw dietician on 04/07/23, reports that he has done finger sticks and used his phone and readings are accurate.  Update-  04/20/23- pt states CBG in good shape, pt feels managing much better, states Libre CBG average 98, AIC average 5.6,  Pt had eye exam 03/31/23.   RNCM Clinical Goal(s):  Patient will work with the Care Management team over the next 30 days to address Transition of Care Barriers: Diet/Nutrition/Food Resources verbalize understanding of plan for management of DMII as evidenced by patient report, review of EMR and  through collaboration with RN Care manager, provider, and care team.   Interventions: Evaluation of current treatment plan related to  self management and patient's adherence to plan as established by provider   Diabetes Interventions:  (Status:  Goal on track:  Yes.) Short Term Goal Assessed patient's understanding of A1c goal: <7% Reviewed medications with patient and discussed importance of medication adherence Counseled on importance of regular laboratory monitoring as prescribed Discussed plans with patient for ongoing care management follow up and provided patient with direct contact information for care management team Review of patient status, including review of consultants reports, relevant laboratory and other test results, and medications completed Reinforced signs/ symptoms of hypoglycemia and treatment Reinforced fasting blood sugar goals of 80-130 and less than 180 1 1/2-2 hours  after meals Reinforced carbohydrate modified diet/ plate method with patient Reviewed CBG log with patient Reviewed upcoming scheduled appointments Reviewed importance of bedtime snack to include protein to stabilize blood sugar at night.  Encouraged patient to see the dietitian for assistance with meals and  snacks.  Reviewed plan of care including case closure for transition of care, offered ongoing longitudinal care management and pt declines stating  not necessary Lab Results  Component Value Date   HGBA1C 13.7 (H) 03/15/2023    Patient Goals/Self-Care Activities: Participate in Transition of Care Program/Attend TOC scheduled calls Notify RN Care Manager of TOC call rescheduling needs Take all medications as prescribed Attend all scheduled provider appointments Call pharmacy for medication refills 3-7 days in advance of running out of medications Perform all self care activities independently  Call provider office for new concerns or questions  check blood sugar at prescribed times: per [facility] check feet daily for cuts, sores or redness enter blood sugar readings and medication or insulin  into daily log take the blood sugar log to all doctor visits trim toenails straight across fill half of plate with vegetables manage portion size prepare main meal at home 3 to 5 days each week wash and dry feet carefully every day wear comfortable, cotton socks wear comfortable, well-fitting shoes Follow up with Nutrition Diabetes Management as needed Follow up with primary care provider as needed Follow plate method, be mindful of carbohydrate intake at each meal If you change your mind or have any care management needs in the future, please let your primary care doctor know Follow RULE OF 15 for low blood sugar management:  How to treat low blood sugars (Blood sugar less than 70 mg/dl  Please follow the RULE OF 15 for the treatment of hypoglycemia treatment (When your blood sugars are less than 70 mg/ dl) STEP  1:  Take 15 grams of carbohydrates when your blood sugar is low, which includes One tube of glucose gel STEP 2:  Recheck blood sugar in 15 minutes STEP 3:  3-4 glucose tabs or  3-4 oz of juice or regular soda or If your blood sugar is still low at the 15 minute recheck  ---then, go back to STEP 1 and treat again with another 15 grams of carbohydrates  Provided my contact information for patient to call me or Mliss if needed.  Follow Up Plan:  No further follow up required: case closure          Plan: The patient has been provided with contact information for the care management team and has been advised to call with any health related questions or concerns.   Mliss Creed [facility], BSN RN Care Manager/ Transition of Care [place]/ [facility] [phone]

## 2023-04-20 NOTE — Patient Instructions (Signed)
Visit Information  Thank you for taking time to visit with me today. Please don't hesitate to contact me if I can be of assistance to you before our next scheduled telephone appointment.  Following are the goals we discussed today:   Goals Addressed             This Visit's Progress    COMPLETED: Transition of Care/ pt will have no readmissions within 30 days       Current Barriers:  Knowledge Deficits related to plan of care for management of DMII  Patient reports that he is doing well managing his CBG at home. Reports that he is in contact with MD to adjust his medications.  Patient reports he monitors CBG with Freestyle Libre and also checks with glucometer as needed, pt reports he has had hardly any hypoglycemic episodes, none over the past week, pt states he is eating a snack and bedtime and this is helping, pt saw dietician on 04/07/23, reports that he has done finger sticks and used his phone and readings are accurate.  Update-  04/20/23- pt states CBG "in good shape", pt feels managing much better, states Libre CBG average 98, AIC average 5.6,  Pt had eye exam 03/31/23.   RNCM Clinical Goal(s):  Patient will work with the Care Management team over the next 30 days to address Transition of Care Barriers: Diet/Nutrition/Food Resources verbalize understanding of plan for management of DMII as evidenced by patient report, review of EMR and  through collaboration with RN Care manager, provider, and care team.   Interventions: Evaluation of current treatment plan related to  self management and patient's adherence to plan as established by provider   Diabetes Interventions:  (Status:  Goal on track:  Yes.) Short Term Goal Assessed patient's understanding of A1c goal: <7% Reviewed medications with patient and discussed importance of medication adherence Counseled on importance of regular laboratory monitoring as prescribed Discussed plans with patient for ongoing care management follow up  and provided patient with direct contact information for care management team Review of patient status, including review of consultants reports, relevant laboratory and other test results, and medications completed Reinforced signs/ symptoms of hypoglycemia and treatment Reinforced fasting blood sugar goals of 80-130 and less than 180 1 1/2-2 hours after meals Reinforced carbohydrate modified diet/ plate method with patient Reviewed CBG log with patient Reviewed upcoming scheduled appointments Reviewed importance of bedtime snack to include protein to stabilize blood sugar at night.  Encouraged patient to see the dietitian for assistance with meals and snacks.  Reviewed plan of care including case closure for transition of care, offered ongoing longitudinal care management and pt declines stating " not necessary" Lab Results  Component Value Date   HGBA1C 13.7 (H) 03/15/2023    Patient Goals/Self-Care Activities: Participate in Transition of Care Program/Attend [facility] scheduled calls Notify RN Care Manager of TOC call rescheduling needs Take all medications as prescribed Attend all scheduled provider appointments Call pharmacy for medication refills 3-7 days in advance of running out of medications Perform all self care activities independently  Call provider office for new concerns or questions  check blood sugar at prescribed times: per [facility] check feet daily for cuts, sores or redness enter blood sugar readings and medication or insulin into daily log take the blood sugar log to all doctor visits trim toenails straight across fill half of plate with vegetables manage portion size prepare main meal at home 3 to 5 days each week wash and  dry feet carefully every day wear comfortable, cotton socks wear comfortable, well-fitting shoes Follow up with Nutrition Diabetes Management as needed Follow up with primary care provider as needed Follow plate method, be mindful of  carbohydrate intake at each meal If you change your mind or have any care management needs in the future, please let your primary care doctor know Follow RULE OF 15 for low blood sugar management:  How to treat low blood sugars (Blood sugar less than 70 mg/dl  Please follow the RULE OF 15 for the treatment of hypoglycemia treatment (When your blood sugars are less than 70 mg/ dl) STEP  1:  Take 15 grams of carbohydrates when your blood sugar is low, which includes One tube of glucose gel STEP 2:  Recheck blood sugar in 15 minutes STEP 3:  3-4 glucose tabs or  3-4 oz of juice or regular soda or If your blood sugar is still low at the 15 minute recheck ---then, go back to STEP 1 and treat again with another 15 grams of carbohydrates  Provided my contact information for patient to call me or Raynelle Fanning if needed.  Follow Up Plan:  No further follow up required: case closure            Please call the care guide team at [phone] if you need to cancel or reschedule your appointment.   If you are experiencing a Mental Health or Behavioral Health Crisis or need someone to talk to, please call the Suicide and Crisis Lifeline: 988 call the [place] National Suicide Prevention Lifeline: (769) 286-5747 or TTY: 607-206-5183 TTY (418)091-3393) to talk to a trained counselor call 1-800-273-TALK (toll free, 24 hour hotline) go to [facility] [address], [place] [phone]) call the [facility] Crisis Line: [phone] call 911   Patient verbalizes understanding of instructions and care plan provided today and agrees to view in MyChart. Active MyChart status and patient understanding of how to access instructions and care plan via MyChart confirmed with patient.     [facility], BSN RN Care Manager/ Transition of Care [place]/ [facility] [phone]

## 2023-04-22 ENCOUNTER — Other Ambulatory Visit: Payer: Self-pay | Admitting: Family Medicine

## 2023-05-03 ENCOUNTER — Ambulatory Visit: Payer: Medicare HMO | Admitting: Family Medicine

## 2023-05-04 ENCOUNTER — Other Ambulatory Visit: Payer: Medicare HMO

## 2023-05-05 ENCOUNTER — Other Ambulatory Visit: Payer: Self-pay | Admitting: Family Medicine

## 2023-05-12 ENCOUNTER — Ambulatory Visit: Payer: Medicare HMO | Admitting: Family Medicine

## 2023-05-12 ENCOUNTER — Encounter: Payer: Self-pay | Admitting: Family Medicine

## 2023-05-12 VITALS — BP 127/75 | HR 107 | Temp 97.5°F | Ht 62.0 in | Wt 171.0 lb

## 2023-05-12 DIAGNOSIS — Z7984 Long term (current) use of oral hypoglycemic drugs: Secondary | ICD-10-CM

## 2023-05-12 DIAGNOSIS — E756 Lipid storage disorder, unspecified: Secondary | ICD-10-CM

## 2023-05-12 DIAGNOSIS — E1169 Type 2 diabetes mellitus with other specified complication: Secondary | ICD-10-CM | POA: Diagnosis not present

## 2023-05-12 DIAGNOSIS — I10 Essential (primary) hypertension: Secondary | ICD-10-CM | POA: Diagnosis not present

## 2023-05-12 DIAGNOSIS — Z23 Encounter for immunization: Secondary | ICD-10-CM

## 2023-05-12 MED ORDER — LANTUS SOLOSTAR 100 UNIT/ML ~~LOC~~ SOPN: 10.0000 [IU] | PEN_INJECTOR | SUBCUTANEOUS | 3 refills | Status: DC

## 2023-05-12 MED ORDER — FREESTYLE LIBRE 3 PLUS SENSOR MISC: 3 refills | Status: DC

## 2023-05-12 NOTE — Patient Instructions (Signed)
 Increase Lantus to 15 units if the fasting glucose starts running higher than 125 most days. Also increase if the 2 hour after meal reading starts going over 180 most of the time.

## 2023-05-12 NOTE — Progress Notes (Signed)
 Subjective:  Patient ID: Tyler Iha., male    DOB: [birth year]  Age: 68 y.o. MRN: [id number]  CC: Medical Management of Chronic Issues (Would like to discuss medications and sugars. Basilia Jumbo libre 3 plus needs to be ordered)   HPI Tyler Pruitt. presents forFollow-up of diabetes. Patient checks blood sugar at home.  Libre states readings avg 94.Had some lows detected on the mter so dropped the Lantus to 15 units last week. The 94 average was for the week after that decrease in dose.  Patient denies symptoms such as polyuria, polydipsia, excessive hunger, nausea No significant hypoglycemic symptoms noted. Medications reviewed. Now on Mounjaro and Lantus. Needs updated meter as Libre 3 being phased out and new Libre 3 Plus introduced.   History Tyler Pruitt has a past medical history of Anxiety, Arthritis, Diabetes (HCC), Fatty liver, GAD (generalized anxiety disorder), GERD (gastroesophageal reflux disease), Hyperlipidemia, Hypertension, IBS (irritable bowel syndrome), Low testosterone, Palpitations, Palpitations, and Vitamin D deficiency.   He has a past surgical history that includes None; Colonoscopy; Polypectomy; and Carpal tunnel release (Right).   His family history includes Heart disease (age of onset: 72) in his father; Rheum arthritis in his mother.He reports that he quit smoking about 31 years ago. His smoking use included cigarettes. He has never been exposed to tobacco smoke. He has never used smokeless tobacco. He reports current alcohol use of about 2.0 - 3.0 standard drinks of alcohol per week. He reports that he does not use drugs.  Current Outpatient Medications on File Prior to Visit  Medication Sig Dispense Refill   doxepin (SINEQUAN) 25 MG capsule TAKE 1 CAPSULE BY MOUTH EVERYDAY AT BEDTIME 90 capsule 3   Insulin Pen Needle 32G X 4 MM MISC Use to check blood sugars 2 (two) times daily. 100 each 0   lisinopril (ZESTRIL) 10 MG tablet Take 10 mg by mouth daily.      metFORMIN (GLUCOPHAGE-XR) 500 MG 24 hr tablet Take 1 tablet (500 mg total) by mouth daily with breakfast.     rosuvastatin (CRESTOR) 10 MG tablet Take 1 tablet (10 mg total) by mouth daily. 90 tablet 3   tirzepatide (MOUNJARO) 5 MG/0.5ML Pen Inject 5 mg into the skin once a week. 6 mL 2   Vitamin D, Ergocalciferol, (DRISDOL) 1.25 MG (50000 UNIT) CAPS capsule TAKE 1 CAPSULE (50,000 UNITS TOTAL) BY MOUTH EVERY 7 (SEVEN) DAYS 13 capsule 0   No current facility-administered medications on file prior to visit.    ROS Review of Systems  Constitutional:  Negative for fever.  Respiratory:  Negative for shortness of breath.   Cardiovascular:  Negative for chest pain.  Musculoskeletal:  Negative for arthralgias.  Skin:  Negative for rash.    Objective:  BP 127/75   Pulse (!) 107   Temp (!) 97.5 F (36.4 C)   Ht 5\' 2"  (1.575 m)   Wt 171 lb (77.6 kg)   SpO2 98%   BMI 31.28 kg/m   BP Readings from Last 3 Encounters:  05/12/23 127/75  04/09/23 125/74  03/31/23 111/67    Wt Readings from Last 3 Encounters:  05/12/23 171 lb (77.6 kg)  04/09/23 182 lb (82.6 kg)  03/31/23 184 lb (83.5 kg)     Physical Exam Constitutional:      Appearance: Normal appearance.  HENT:     Head: Normocephalic and atraumatic.     Nose: Nose normal.  Eyes:     Extraocular Movements: Extraocular movements intact.  Pupils: Pupils are equal, round, and reactive to light.  Pulmonary:     Effort: Pulmonary effort is normal.  Abdominal:     General: Abdomen is flat.  Musculoskeletal:        General: Normal range of motion.     Cervical back: Normal range of motion.  Neurological:     General: No focal deficit present.     Mental Status: He is alert.  Psychiatric:        Mood and Affect: Mood normal.        Behavior: Behavior normal.        Thought Content: Thought content normal.    Results for orders placed or performed in visit on 05/04/23  HM DIABETES EYE EXAM   Collection Time: 03/31/23   4:56 PM  Result Value Ref Range   HM Diabetic Eye Exam Retinopathy (A) No Retinopathy      Assessment & Plan:    1. Diabetic lipidosis (HCC)   2. Primary hypertension       Orders Placed This Encounter  Procedures   Zoster Recombinant (Shingrix )      I have discontinued Tyler Mow Jr.'s Blood Glucose Monitoring Suppl, OneTouch Verio Flex System, BLOOD GLUCOSE TEST STRIPS, FreeStyle Libre 3 Sensor, and [employer]. I have also changed his Lantus SoloStar. Additionally, I am having him start on FreeStyle Libre 3 Plus Sensor. Lastly, I am having him maintain his Insulin Pen Needle, doxepin, rosuvastatin, metFORMIN, lisinopril, tirzepatide, and Vitamin D (Ergocalciferol).  Meds ordered this encounter  Medications   insulin glargine (LANTUS SOLOSTAR) 100 UNIT/ML Solostar Pen    Sig: Inject 10 Units into the skin every morning. Pt dropped to 15 units    Dispense:  15 mL    Refill:  3    Cancel scrip for 70/30 insulin   Continuous Glucose Sensor (FREESTYLE LIBRE 3 PLUS SENSOR) MISC    Sig: Change sensor every 15 days.    Dispense:  6 each    Refill:  3     Follow-up: Return in about 6 weeks (around 06/24/2023) for diabetes.  Mechele Claude, M.D.

## 2023-05-17 DIAGNOSIS — E119 Type 2 diabetes mellitus without complications: Secondary | ICD-10-CM | POA: Diagnosis not present

## 2023-05-17 DIAGNOSIS — H40013 Open angle with borderline findings, low risk, bilateral: Secondary | ICD-10-CM | POA: Diagnosis not present

## 2023-05-17 DIAGNOSIS — H3581 Retinal edema: Secondary | ICD-10-CM | POA: Diagnosis not present

## 2023-05-17 DIAGNOSIS — H25813 Combined forms of age-related cataract, bilateral: Secondary | ICD-10-CM | POA: Diagnosis not present

## 2023-06-17 ENCOUNTER — Encounter: Payer: Self-pay | Admitting: Family Medicine

## 2023-06-17 ENCOUNTER — Ambulatory Visit (INDEPENDENT_AMBULATORY_CARE_PROVIDER_SITE_OTHER): Payer: Medicare HMO | Admitting: Family Medicine

## 2023-06-17 VITALS — BP 128/75 | HR 89 | Temp 97.7°F | Ht 62.0 in | Wt 164.0 lb

## 2023-06-17 DIAGNOSIS — E1169 Type 2 diabetes mellitus with other specified complication: Secondary | ICD-10-CM

## 2023-06-17 DIAGNOSIS — Z0001 Encounter for general adult medical examination with abnormal findings: Secondary | ICD-10-CM | POA: Diagnosis not present

## 2023-06-17 DIAGNOSIS — Z1159 Encounter for screening for other viral diseases: Secondary | ICD-10-CM

## 2023-06-17 DIAGNOSIS — I10 Essential (primary) hypertension: Secondary | ICD-10-CM

## 2023-06-17 DIAGNOSIS — E756 Lipid storage disorder, unspecified: Secondary | ICD-10-CM

## 2023-06-17 DIAGNOSIS — E1110 Type 2 diabetes mellitus with ketoacidosis without coma: Secondary | ICD-10-CM | POA: Diagnosis not present

## 2023-06-17 DIAGNOSIS — Z0000 Encounter for general adult medical examination without abnormal findings: Secondary | ICD-10-CM

## 2023-06-17 DIAGNOSIS — E11649 Type 2 diabetes mellitus with hypoglycemia without coma: Secondary | ICD-10-CM | POA: Diagnosis not present

## 2023-06-17 DIAGNOSIS — E669 Obesity, unspecified: Secondary | ICD-10-CM

## 2023-06-17 LAB — LIPID PANEL

## 2023-06-17 LAB — BAYER DCA HB A1C WAIVED: HB A1C (BAYER DCA - WAIVED): 5.7 % — ABNORMAL HIGH (ref 4.8–5.6)

## 2023-06-17 MED ORDER — LANTUS SOLOSTAR 100 UNIT/ML ~~LOC~~ SOPN: 10.0000 [IU] | PEN_INJECTOR | SUBCUTANEOUS | 3 refills | Status: DC

## 2023-06-17 NOTE — Progress Notes (Signed)
 Annual Wellness Visit     Patient: Tyler Pruitt., Male    DOB: 03-30-1955, 68 y.o.   MRN: [id number]  Subjective  Chief Complaint  Patient presents with   Medicare Wellness    No concerns    Ladarrion Telfair. is a 68 y.o. male who presents today for his Annual Wellness Visit. He reports consuming a general diet. The patient does not participate in regular exercise at present. He generally feels well. He reports sleeping well. He does not have additional problems to discuss today.   presents forFollow-up of diabetes. Patient checks blood sugar at home.  Avg glucose 94-97.  Patient denies symptoms such as polyuria, polydipsia, excessive hunger, nausea No significant hypoglycemic spells noted. Medications reviewed. Pt reports taking them regularly without complication/adverse reaction being reported today. Lab Results      Component                Value               Date                      HGBA1C                   13.7 (H)            03/15/2023                HGBA1C                   5.1                 02/06/2013                    Past Medical History:  Diagnosis Date   Anxiety    generalized anxiety disorder   Arthritis    BACK   Diabetes (HCC)    Fatty liver    GAD (generalized anxiety disorder)    GERD (gastroesophageal reflux disease)    Hyperlipidemia    Hypertension    IBS (irritable bowel syndrome)    Low testosterone    Palpitations    Palpitations    Vitamin D deficiency    Past Surgical History:  Procedure Laterality Date   CARPAL TUNNEL RELEASE Right    COLONOSCOPY     None     POLYPECTOMY     Social History   Tobacco Use   Smoking status: Former    Current packs/day: 0.00    Types: Cigarettes    Quit date: 12/22/1991    Years since quitting: 31.5    Passive exposure: Never   Smokeless tobacco: Never   Tobacco comments:    Smoked few cigarettes in the past  Vaping Use   Vaping status: Never Used  Substance Use Topics    Alcohol use: Yes    Alcohol/week: 2.0 - 3.0 standard drinks of alcohol    Types: 2 - 3 Cans of beer per week    Comment: 1-2 WEEK   Drug use: No   Allergies  Allergen Reactions   Pravastatin     Hurting "badly"      Medications: Outpatient Medications Prior to Visit  Medication Sig   Continuous Glucose Sensor (FREESTYLE LIBRE 3 PLUS SENSOR) MISC Change sensor every 15 days.   doxepin (SINEQUAN) 25 MG capsule TAKE 1 CAPSULE BY MOUTH EVERYDAY AT BEDTIME   Insulin Pen Needle 32G X 4 MM MISC  Use to check blood sugars 2 (two) times daily.   lisinopril (ZESTRIL) 10 MG tablet Take 10 mg by mouth daily.   metFORMIN (GLUCOPHAGE-XR) 500 MG 24 hr tablet Take 1 tablet (500 mg total) by mouth daily with breakfast.   rosuvastatin (CRESTOR) 10 MG tablet Take 1 tablet (10 mg total) by mouth daily.   tirzepatide [facility]) 5 MG/0.5ML Pen Inject 5 mg into the skin once a week.   Vitamin D, Ergocalciferol, (DRISDOL) 1.25 MG (50000 UNIT) CAPS capsule TAKE 1 CAPSULE (50,000 UNITS TOTAL) BY MOUTH EVERY 7 (SEVEN) DAYS   [DISCONTINUED] insulin glargine (LANTUS SOLOSTAR) 100 UNIT/ML Solostar Pen Inject 10 Units into the skin every morning. Pt dropped to 15 units   No facility-administered medications prior to visit.    Allergies  Allergen Reactions   Pravastatin     Hurting "badly"    Patient Care Team: Mechele Claude, MD as PCP - General (Family Medicine)  Review of Systems  Constitutional:  Negative for chills, diaphoresis, fever, malaise/fatigue and weight loss.  HENT:  Negative for congestion, ear pain, hearing loss, nosebleeds, sore throat and tinnitus.   Eyes:  Negative for blurred vision, double vision, photophobia, pain, discharge and redness.  Respiratory:  Negative for cough, hemoptysis, sputum production, shortness of breath and wheezing.   Cardiovascular:  Negative for chest pain, palpitations, orthopnea, leg swelling and PND.  Gastrointestinal:  Negative for abdominal pain, blood in  stool, constipation, diarrhea, heartburn, melena, nausea and vomiting.  Genitourinary:  Negative for dysuria, flank pain, frequency, hematuria and urgency.  Musculoskeletal:  Positive for myalgias (legs ache). Negative for back pain, falls and neck pain.  Skin:  Negative for itching and rash.  Neurological:  Negative for dizziness, tingling, tremors, sensory change, speech change, focal weakness, seizures, loss of consciousness, weakness and headaches.  Endo/Heme/Allergies:  Negative for environmental allergies and polydipsia. Does not bruise/bleed easily.  Psychiatric/Behavioral:  Negative for depression, hallucinations, memory loss, substance abuse and suicidal ideas. The patient is not nervous/anxious and does not have insomnia.         Objective  BP 128/75   Pulse 89   Temp 97.7 F (36.5 C)   Ht 5\' 2"  (1.575 m)   Wt 164 lb (74.4 kg)   SpO2 97%   BMI 30.00 kg/m  BP Readings from Last 3 Encounters:  06/17/23 128/75  05/12/23 127/75  04/09/23 125/74   Wt Readings from Last 3 Encounters:  06/17/23 164 lb (74.4 kg)  05/12/23 171 lb (77.6 kg)  04/09/23 182 lb (82.6 kg)      Physical Exam Vitals reviewed.  Constitutional:      Appearance: He is well-developed.  HENT:     Head: Normocephalic and atraumatic.     Right Ear: External ear normal.     Left Ear: External ear normal.     Mouth/Throat:     Pharynx: No oropharyngeal exudate or posterior oropharyngeal erythema.  Eyes:     Pupils: Pupils are equal, round, and reactive to light.  Cardiovascular:     Rate and Rhythm: Normal rate and regular rhythm.     Heart sounds: No murmur heard. Pulmonary:     Effort: No respiratory distress.     Breath sounds: Normal breath sounds.  Musculoskeletal:     Cervical back: Normal range of motion and neck supple.  Neurological:     Mental Status: He is alert and oriented to person, place, and time.       Most recent functional status assessment:  06/17/2023   10:55 AM   In your present state of health, do you have any difficulty performing the following activities:  Hearing? 0  Vision? 0  Difficulty concentrating or making decisions? 0  Walking or climbing stairs? 0  Dressing or bathing? 0  Doing errands, shopping? 0  Preparing Food and eating ? N  Using the Toilet? N  In the past six months, have you accidently leaked urine? N  Do you have problems with loss of bowel control? N  Managing your Medications? N  Managing your Finances? N  Housekeeping or managing your Housekeeping? N   Most recent fall risk assessment:    06/17/2023   10:56 AM  Fall Risk   Falls in the past year? 0  Number falls in past yr: 0  Injury with Fall? 0    Most recent depression screenings:    06/17/2023   10:58 AM 04/09/2023   11:49 AM  PHQ 2/9 Scores  PHQ - 2 Score 0 0  PHQ- 9 Score  0   Most recent cognitive screening:     No data to display         Most recent Audit-C alcohol use screening    06/17/2023   11:00 AM  Alcohol Use Disorder Test (AUDIT)  1. How often do you have a drink containing alcohol? 1  2. How many drinks containing alcohol do you have on a typical day when you are drinking? 0  3. How often do you have six or more drinks on one occasion? 0  AUDIT-C Score 1   A score of 3 or more in women, and 4 or more in men indicates increased risk for alcohol abuse, EXCEPT if all of the points are from question 1   Vision/Hearing Screen: UTD    No results found for any visits on 06/17/23.    Assessment & Plan   Annual wellness visit done today including the all of the following: Reviewed patient's Family Medical HistoryReviewed and updated list of patient's medical providers Assessment of cognitive impairment was done Assessed patient's functional ability Established a written schedule for health screening services Health Risk Assessent Completed and Reviewed  Exercise Activities and Dietary recommendations  Goals   None      Immunization History  Administered Date(s) Administered   Fluad Trivalent(High Dose 65+) 12/29/2022   Influenza,inj,Quad PF,6+ Mos 02/06/2013, 12/25/2016, 01/18/2018, 03/20/2020, 01/29/2021, 05/11/2022   Moderna SARS-COV2 Booster Vaccination 05/08/2020   Moderna Sars-Covid-2 Vaccination 05/25/2019, 06/24/2019   Tdap 10/28/2012   Zoster Recombinant(Shingrix) 11/09/2022, 05/12/2023    Health Maintenance  Topic Date Due   Diabetic kidney evaluation - Urine ACR  Never done   Hepatitis C Screening  Never done   COVID-19 Vaccine (4 - 2024-25 season) 07/03/2023 (Originally 11/15/2022)   FOOT EXAM  08/17/2023 (Originally 10/28/1967)   Pneumonia Vaccine 28+ Years old (1 of 2 - PCV) 11/09/2023 (Originally 10/28/1963)   DTaP/Tdap/Td (2 - Td or Tdap) 03/14/2024 (Originally 10/29/2022)   HEMOGLOBIN A1C  09/13/2023   INFLUENZA VACCINE  10/15/2023   Diabetic kidney evaluation - eGFR measurement  03/19/2024   OPHTHALMOLOGY EXAM  03/30/2024   Medicare Annual Wellness (AWV)  06/16/2024   Colonoscopy  11/27/2031   HIV Screening  Completed   Zoster Vaccines- Shingrix  Completed   HPV VACCINES  Aged Out     Discussed health benefits of physical activity, and encouraged him to engage in regular exercise appropriate for his age and condition.    Problem List  Items Addressed This Visit       Active Problems   Diabetic lipidosis (HCC)   Relevant Medications   insulin glargine (LANTUS SOLOSTAR) 100 UNIT/ML Solostar Pen   Other Relevant Orders   Bayer DCA Hb A1c Waived   Lipid panel   HTN (hypertension)   Relevant Orders   CBC with Differential/Platelet   CMP14+EGFR   Other Visit Diagnoses       Diabetic hypoglycemia (HCC)    -  Primary   Relevant Medications   insulin glargine (LANTUS SOLOSTAR) 100 UNIT/ML Solostar Pen   Other Relevant Orders   Lipid panel     Diabetic ketoacidosis without coma associated with type 2 diabetes mellitus (HCC)       Relevant Medications   insulin glargine  (LANTUS SOLOSTAR) 100 UNIT/ML Solostar Pen   Other Relevant Orders   Bayer DCA Hb A1c Waived     Obesity (BMI 35.0-39.9 without comorbidity)       Relevant Medications   insulin glargine (LANTUS SOLOSTAR) 100 UNIT/ML Solostar Pen   Other Relevant Orders   CBC with Differential/Platelet   Lipid panel     Well adult exam       Relevant Orders   Bayer DCA Hb A1c Waived   CBC with Differential/Platelet   CMP14+EGFR   Lipid panel     Need for hepatitis C screening test       Relevant Orders   Hepatitis C antibody     Medicare welcome visit       Relevant Orders   EKG 12-Lead (Completed)       Return in about 3 months (around 09/16/2023).     Mechele Claude, MD

## 2023-06-18 LAB — LIPID PANEL
Cholesterol, Total: 102 mg/dL (ref 100–199)
HDL: 47 mg/dL (ref >39)
LDL CALC COMMENT:: 2.2 ratio (ref 0.0–5.0)
LDL Chol Calc (NIH): 41 mg/dL (ref 0–99)
Triglycerides: 65 mg/dL (ref 0–149)
VLDL Cholesterol Cal: 14 mg/dL (ref 5–40)

## 2023-06-18 LAB — CMP14+EGFR
ALT: 19 IU/L (ref 0–44)
AST: 20 IU/L (ref 0–40)
Albumin: 4.3 g/dL (ref 3.9–4.9)
Alkaline Phosphatase: 64 IU/L (ref 44–121)
BUN/Creatinine Ratio: 22 (ref 10–24)
BUN: 20 mg/dL (ref 8–27)
Bilirubin Total: 0.3 mg/dL (ref 0.0–1.2)
CO2: 24 mmol/L (ref 20–29)
Calcium: 9.9 mg/dL (ref 8.6–10.2)
Chloride: 105 mmol/L (ref 96–106)
Creatinine, Ser: 0.93 mg/dL (ref 0.76–1.27)
Globulin, Total: 2.4 g/dL (ref 1.5–4.5)
Glucose: 85 mg/dL (ref 70–99)
Potassium: 4.3 mmol/L (ref 3.5–5.2)
Sodium: 141 mmol/L (ref 134–144)
Total Protein: 6.7 g/dL (ref 6.0–8.5)
eGFR: 91 mL/min/{1.73_m2} (ref >59)

## 2023-06-18 LAB — CBC WITH DIFFERENTIAL/PLATELET
Basophils Absolute: 0.1 10*3/uL (ref 0.0–0.2)
Basos: 1 %
EOS (ABSOLUTE): 0.1 10*3/uL (ref 0.0–0.4)
Eos: 3 %
Hematocrit: 39.3 % (ref 37.5–51.0)
Hemoglobin: 13 g/dL (ref 13.0–17.7)
Immature Grans (Abs): 0 10*3/uL (ref 0.0–0.1)
Immature Granulocytes: 0 %
Lymphocytes Absolute: 1.7 10*3/uL (ref 0.7–3.1)
Lymphs: 29 %
MCH: 29.8 pg (ref 26.6–33.0)
MCHC: 33.1 g/dL (ref 31.5–35.7)
MCV: 90 fL (ref 79–97)
Monocytes Absolute: 0.5 10*3/uL (ref 0.1–0.9)
Monocytes: 9 %
Neutrophils Absolute: 3.3 10*3/uL (ref 1.4–7.0)
Neutrophils: 58 %
Platelets: 209 10*3/uL (ref 150–450)
RBC: 4.36 x10E6/uL (ref 4.14–5.80)
RDW: 12.1 % (ref 11.6–15.4)
WBC: 5.7 10*3/uL (ref 3.4–10.8)

## 2023-06-21 ENCOUNTER — Encounter: Payer: Self-pay | Admitting: Family Medicine

## 2023-06-21 NOTE — Progress Notes (Signed)
 Hello Dyami,  Your lab result is normal and/or stable.Some minor variations that are not significant are commonly marked abnormal, but do not represent any medical problem for you.  Best regards, Mechele Claude, M.D.

## 2023-06-24 ENCOUNTER — Ambulatory Visit: Payer: Medicare HMO | Admitting: Family Medicine

## 2023-06-24 LAB — HCV INTERPRETATION

## 2023-06-24 LAB — HCV AB W REFLEX TO QUANT PCR

## 2023-06-24 LAB — SPECIMEN STATUS REPORT

## 2023-07-31 ENCOUNTER — Other Ambulatory Visit: Payer: Self-pay | Admitting: Family Medicine

## 2023-08-01 ENCOUNTER — Other Ambulatory Visit: Payer: Self-pay | Admitting: Family Medicine

## 2023-08-01 DIAGNOSIS — I10 Essential (primary) hypertension: Secondary | ICD-10-CM

## 2023-08-02 DIAGNOSIS — L821 Other seborrheic keratosis: Secondary | ICD-10-CM | POA: Diagnosis not present

## 2023-08-02 DIAGNOSIS — L814 Other melanin hyperpigmentation: Secondary | ICD-10-CM | POA: Diagnosis not present

## 2023-08-02 DIAGNOSIS — D225 Melanocytic nevi of trunk: Secondary | ICD-10-CM | POA: Diagnosis not present

## 2023-10-07 ENCOUNTER — Ambulatory Visit: Payer: Self-pay | Admitting: Family Medicine

## 2023-10-07 ENCOUNTER — Ambulatory Visit (INDEPENDENT_AMBULATORY_CARE_PROVIDER_SITE_OTHER): Admitting: Family Medicine

## 2023-10-07 ENCOUNTER — Encounter: Payer: Self-pay | Admitting: Family Medicine

## 2023-10-07 VITALS — BP 158/77 | HR 76 | Temp 98.3°F | Ht 62.0 in | Wt 146.0 lb

## 2023-10-07 DIAGNOSIS — Z7984 Long term (current) use of oral hypoglycemic drugs: Secondary | ICD-10-CM

## 2023-10-07 DIAGNOSIS — F5101 Primary insomnia: Secondary | ICD-10-CM | POA: Diagnosis not present

## 2023-10-07 DIAGNOSIS — E756 Lipid storage disorder, unspecified: Secondary | ICD-10-CM | POA: Diagnosis not present

## 2023-10-07 DIAGNOSIS — I10 Essential (primary) hypertension: Secondary | ICD-10-CM | POA: Diagnosis not present

## 2023-10-07 DIAGNOSIS — E1169 Type 2 diabetes mellitus with other specified complication: Secondary | ICD-10-CM

## 2023-10-07 DIAGNOSIS — E11649 Type 2 diabetes mellitus with hypoglycemia without coma: Secondary | ICD-10-CM

## 2023-10-07 LAB — BAYER DCA HB A1C WAIVED: HB A1C (BAYER DCA - WAIVED): 5.1 % (ref 4.8–5.6)

## 2023-10-07 MED ORDER — INSULIN PEN NEEDLE 32G X 4 MM MISC: 1.0000 | Freq: Two times a day (BID) | 3 refills | Status: AC

## 2023-10-07 MED ORDER — FREESTYLE LIBRE 3 READER DEVI: 99 refills | Status: AC

## 2023-10-07 MED ORDER — LANTUS SOLOSTAR 100 UNIT/ML ~~LOC~~ SOPN: 5.0000 [IU] | PEN_INJECTOR | SUBCUTANEOUS | 3 refills | Status: AC

## 2023-10-07 MED ORDER — DOXEPIN HCL 25 MG PO CAPS: ORAL_CAPSULE | ORAL | 3 refills | Status: AC

## 2023-10-07 MED ORDER — FREESTYLE LIBRE 3 PLUS SENSOR MISC: 3 refills | Status: AC

## 2023-10-07 MED ORDER — VITAMIN D (ERGOCALCIFEROL) 1.25 MG (50000 UNIT) PO CAPS: 50000.0000 [IU] | ORAL_CAPSULE | ORAL | 0 refills | Status: DC

## 2023-10-07 MED ORDER — LISINOPRIL 10 MG PO TABS
10.0000 mg | ORAL_TABLET | Freq: Every day | ORAL | 1 refills | Status: AC
Start: 2023-10-07 — End: ?

## 2023-10-07 NOTE — Progress Notes (Signed)
 Subjective:  Patient ID: Tyler LELON Jackquline Mickey.,  male    DOB: 28-Dec-1955  Age: 68 y.o.    CC: Medical Management of Chronic Issues and Her[facility] herlene has been unreliable. Needs freestyle libre 3 reader because phone is not compatible. )   HPI Tyler LELON Jackquline Mickey. presents for  follow-up of hypertension. Patient has no history of headache chest pain or shortness of breath or recent cough. Patient also denies symptoms of TIA such as numbness weakness lateralizing. Patient denies side effects from medication. States taking it regularly.  Patient also  in for follow-up of elevated cholesterol. Doing well without complaints on current medication. Denies side effects  including myalgia and arthralgia and nausea. Also in today for liver function testing. Currently no chest pain, shortness of breath or other cardiovascular related symptoms noted.  Follow-up of diabetes. Patient does check blood sugar at home. Readings run between 90 and 130 Patient denies symptoms such as excessive hunger or urinary frequency, excessive hunger, nausea No significant hypoglycemic spells noted. Medications reviewed. Pt reports taking them regularly. Pt. denies complication/adverse reaction today.  Using 6-10 units of Lantus .   History Tyler Pruitt has a past medical history of Anxiety, Arthritis, Diabetes (HCC), Fatty liver, GAD (generalized anxiety disorder), GERD (gastroesophageal reflux disease), Hyperlipidemia, Hypertension, IBS (irritable bowel syndrome), Low testosterone , Palpitations, Palpitations, and Vitamin D  deficiency.   He has a past surgical history that includes None; Colonoscopy; Polypectomy; and Carpal tunnel release (Right).   His family history includes Heart disease (age of onset: 69) in his father; Rheum arthritis in his mother.He reports that he quit smoking about 31 years ago. His smoking use included cigarettes. He has never been exposed to tobacco smoke. He has never used smokeless tobacco. He  reports current alcohol use of about 2.0 - 3.0 standard drinks of alcohol per week. He reports that he does not use drugs.  Current Outpatient Medications on File Prior to Visit  Medication Sig Dispense Refill   cyclobenzaprine  (FLEXERIL ) 10 MG tablet Take 10 mg by mouth 3 (three) times daily as needed for muscle spasms.     metFORMIN  (GLUCOPHAGE -XR) 500 MG 24 hr tablet Take 1 tablet (500 mg total) by mouth daily with breakfast.     rosuvastatin  (CRESTOR ) 10 MG tablet Take 1 tablet (10 mg total) by mouth daily. 90 tablet 3   tirzepatide  (MOUNJARO ) 5 MG/0.5ML Pen Inject 5 mg into the skin once a week. 6 mL 2   No current facility-administered medications on file prior to visit.    ROS Review of Systems  Constitutional:  Negative for fever.  Respiratory:  Negative for shortness of breath.   Cardiovascular:  Negative for chest pain.  Musculoskeletal:  Negative for arthralgias.  Skin:  Negative for rash.    Objective:  BP (!) 158/77   Pulse 76   Temp 98.3 F (36.8 C)   Ht 5' 2 (1.575 m)   Wt 146 lb (66.2 kg)   SpO2 100%   BMI 26.70 kg/m   BP Readings from Last 3 Encounters:  10/07/23 (!) 158/77  06/17/23 128/75  05/12/23 127/75    Wt Readings from Last 3 Encounters:  10/07/23 146 lb (66.2 kg)  06/17/23 164 lb (74.4 kg)  05/12/23 171 lb (77.6 kg)    Lab Results  Component Value Date   HGBA1C 5.1 10/07/2023   HGBA1C 5.7 (H) 06/17/2023   HGBA1C 13.7 (H) 03/15/2023    Physical Exam Vitals reviewed.  Constitutional:  Appearance: He is well-developed.  HENT:     Head: Normocephalic and atraumatic.     Right Ear: External ear normal.     Left Ear: External ear normal.     Mouth/Throat:     Pharynx: No oropharyngeal exudate or posterior oropharyngeal erythema.  Eyes:     Pupils: Pupils are equal, round, and reactive to light.  Cardiovascular:     Rate and Rhythm: Normal rate and regular rhythm.     Heart sounds: No murmur heard. Pulmonary:     Effort: No  respiratory distress.     Breath sounds: Normal breath sounds.  Musculoskeletal:     Cervical back: Normal range of motion and neck supple.  Neurological:     Mental Status: He is alert and oriented to person, place, and time.         Assessment & Plan:  Diabetic hypoglycemia (HCC) -     Bayer DCA Hb A1c Waived -     Microalbumin / creatinine urine ratio  Diabetic lipidosis (HCC) -     Bayer DCA Hb A1c Waived -     Microalbumin / creatinine urine ratio  Primary insomnia -     Doxepin  HCl; TAKE 1 CAPSULE BY MOUTH EVERYDAY AT BEDTIME  Dispense: 90 capsule; Refill: 3  Essential (primary) hypertension -     Lisinopril ; Take 1 tablet (10 mg total) by mouth daily.  Dispense: 90 tablet; Refill: 1  Other orders -     Insulin  Pen Needle; Use to check blood sugars 2 (two) times daily.  Dispense: 100 each; Refill: 3 -     FreeStyle Libre 3 Plus Sensor; Change sensor every 15 days.  Dispense: 6 each; Refill: 3 -     Vitamin D  (Ergocalciferol ); Take 1 capsule (50,000 Units total) by mouth every 7 (seven) days.  Dispense: 13 capsule; Refill: 0 -     FreeStyle Libre 3 Reader; Use to read sensor as needed  Dispense: 1 each; Refill: PRN -     Lantus  SoloStar; Inject 5 Units into the skin every morning.  Dispense: 15 mL; Refill: 3    Follow-up: No follow-ups on file.  Butler Der, M.D.

## 2023-10-09 LAB — MICROALBUMIN / CREATININE URINE RATIO
Creatinine, Urine: 106.2 mg/dL
Microalb/Creat Ratio: <3 mg/g{creat} (ref 0–29)
Microalbumin, Urine: <3 ug/mL

## 2023-10-11 NOTE — Progress Notes (Signed)
 Hello Dyami,  Your lab result is normal and/or stable.Some minor variations that are not significant are commonly marked abnormal, but do not represent any medical problem for you.  Best regards, Mechele Claude, M.D.

## 2023-12-10 ENCOUNTER — Other Ambulatory Visit: Payer: Self-pay | Admitting: Family Medicine

## 2023-12-16 ENCOUNTER — Ambulatory Visit (INDEPENDENT_AMBULATORY_CARE_PROVIDER_SITE_OTHER)

## 2023-12-16 DIAGNOSIS — Z23 Encounter for immunization: Secondary | ICD-10-CM | POA: Diagnosis not present

## 2024-01-02 ENCOUNTER — Other Ambulatory Visit: Payer: Self-pay | Admitting: Family Medicine

## 2024-01-27 NOTE — Progress Notes (Signed)
 Tyler Pruitt.                                          MRN: [id number]   01/27/2024   The [facility] Quality Team Specialist reviewed this patient medical record for the purposes of chart review for care gap closure. The following were reviewed: chart review for care gap closure-controlling blood pressure.    [facility] Quality Team

## 2024-02-01 DIAGNOSIS — D225 Melanocytic nevi of trunk: Secondary | ICD-10-CM | POA: Diagnosis not present

## 2024-02-01 DIAGNOSIS — L814 Other melanin hyperpigmentation: Secondary | ICD-10-CM | POA: Diagnosis not present

## 2024-02-01 DIAGNOSIS — L821 Other seborrheic keratosis: Secondary | ICD-10-CM | POA: Diagnosis not present

## 2024-04-10 ENCOUNTER — Ambulatory Visit: Payer: Self-pay | Admitting: Family Medicine

## 2024-04-19 ENCOUNTER — Encounter: Payer: Self-pay | Admitting: *Deleted

## 2024-04-19 NOTE — Progress Notes (Signed)
 Tyler Pruitt Tyler Pruitt.                                          MRN: [id number]   04/19/2024   The [facility] Quality Team Specialist reviewed this patient medical record for the purposes of chart review for care gap closure. The following were reviewed: chart review for care gap closure-controlling blood pressure.    [facility] Quality Team

## 2024-04-27 ENCOUNTER — Ambulatory Visit: Admitting: Family Medicine
